# Patient Record
Sex: Male | Born: 1940
Health system: Southern US, Community
[De-identification: ages and names within clinical notes are randomized; demographics above are authoritative.]

## PROBLEM LIST (undated history)

## (undated) DIAGNOSIS — C801 Malignant (primary) neoplasm, unspecified: Secondary | ICD-10-CM

## (undated) DIAGNOSIS — D699 Hemorrhagic condition, unspecified: Secondary | ICD-10-CM

## (undated) DIAGNOSIS — M1990 Unspecified osteoarthritis, unspecified site: Secondary | ICD-10-CM

## (undated) DIAGNOSIS — E119 Type 2 diabetes mellitus without complications: Secondary | ICD-10-CM

## (undated) DIAGNOSIS — K219 Gastro-esophageal reflux disease without esophagitis: Secondary | ICD-10-CM

## (undated) DIAGNOSIS — I10 Essential (primary) hypertension: Secondary | ICD-10-CM

## (undated) HISTORY — PX: APPENDECTOMY: SHX54

## (undated) HISTORY — PX: REPLACEMENT TOTAL KNEE: SUR1224

## (undated) HISTORY — PX: CARPAL TUNNEL RELEASE: SHX101

## (undated) HISTORY — DX: Hemorrhagic condition, unspecified: D69.9

## (undated) HISTORY — DX: Gastro-esophageal reflux disease without esophagitis: K21.9

## (undated) HISTORY — DX: Type 2 diabetes mellitus without complications: E11.9

## (undated) HISTORY — DX: Unspecified osteoarthritis, unspecified site: M19.90

## (undated) HISTORY — PX: GALLBLADDER SURGERY: SHX652

---

## 2012-03-06 ENCOUNTER — Emergency Department: Payer: Self-pay | Admitting: Emergency Medicine

## 2012-03-06 LAB — COMPREHENSIVE METABOLIC PANEL
Albumin: 3.7 g/dL (ref 3.4–5.0)
Alkaline Phosphatase: 65 U/L (ref 50–136)
BUN: 14 mg/dL (ref 7–18)
Calcium, Total: 8.3 mg/dL — ABNORMAL LOW (ref 8.5–10.1)
Co2: 26 mmol/L (ref 21–32)
EGFR (Non-African Amer.): >60
Glucose: 146 mg/dL — ABNORMAL HIGH (ref 65–99)
SGOT(AST): 33 U/L (ref 15–37)
SGPT (ALT): 23 U/L

## 2012-03-06 LAB — PROTIME-INR
INR: 1.6
Prothrombin Time: 19.4 secs — ABNORMAL HIGH (ref 11.5–14.7)

## 2012-03-06 LAB — CBC
MCH: 31.2 pg (ref 26.0–34.0)
Platelet: 176 10*3/uL (ref 150–440)
RBC: 4.03 10*6/uL — ABNORMAL LOW (ref 4.40–5.90)
RDW: 15.3 % — ABNORMAL HIGH (ref 11.5–14.5)
WBC: 6.6 10*3/uL (ref 3.8–10.6)

## 2012-03-06 LAB — URINALYSIS, COMPLETE
Bacteria: NONE SEEN
Bilirubin,UR: NEGATIVE
Glucose,UR: NEGATIVE mg/dL (ref 0–75)
Hyaline Cast: 1
Leukocyte Esterase: NEGATIVE
RBC,UR: 1 /HPF (ref 0–5)
Squamous Epithelial: NONE SEEN
WBC UR: 1 /HPF (ref 0–5)

## 2012-03-06 LAB — APTT: Activated PTT: 42.3 secs — ABNORMAL HIGH (ref 23.6–35.9)

## 2012-07-28 ENCOUNTER — Ambulatory Visit: Payer: Self-pay | Admitting: General Practice

## 2012-07-28 DIAGNOSIS — I10 Essential (primary) hypertension: Secondary | ICD-10-CM

## 2012-07-28 LAB — BASIC METABOLIC PANEL
Anion Gap: 7 (ref 7–16)
Calcium, Total: 8.8 mg/dL (ref 8.5–10.1)
Co2: 28 mmol/L (ref 21–32)
Creatinine: 1.19 mg/dL (ref 0.60–1.30)
EGFR (African American): >60
EGFR (Non-African Amer.): >60
Glucose: 188 mg/dL — ABNORMAL HIGH (ref 65–99)
Potassium: 4.1 mmol/L (ref 3.5–5.1)
Sodium: 141 mmol/L (ref 136–145)

## 2012-07-28 LAB — CBC
HCT: 37.6 % — ABNORMAL LOW (ref 40.0–52.0)
MCH: 31.6 pg (ref 26.0–34.0)
MCV: 90 fL (ref 80–100)
Platelet: 226 10*3/uL (ref 150–440)
RDW: 14.7 % — ABNORMAL HIGH (ref 11.5–14.5)
WBC: 6.3 10*3/uL (ref 3.8–10.6)

## 2012-07-28 LAB — URINALYSIS, COMPLETE
Bilirubin,UR: NEGATIVE
Glucose,UR: NEGATIVE mg/dL (ref 0–75)
Ph: 5 (ref 4.5–8.0)
RBC,UR: 8 /HPF (ref 0–5)
Specific Gravity: 1.027 (ref 1.003–1.030)
Squamous Epithelial: NONE SEEN

## 2012-07-28 LAB — PROTIME-INR
INR: 1.1
Prothrombin Time: 14.3 secs (ref 11.5–14.7)

## 2012-07-29 LAB — URINE CULTURE

## 2012-08-11 ENCOUNTER — Ambulatory Visit: Payer: Self-pay | Admitting: Vascular Surgery

## 2012-08-11 LAB — PROTIME-INR
INR: 0.9
Prothrombin Time: 12.9 secs (ref 11.5–14.7)

## 2012-08-13 ENCOUNTER — Inpatient Hospital Stay: Payer: Self-pay | Admitting: General Practice

## 2012-08-13 LAB — PROTIME-INR
INR: 0.9
Prothrombin Time: 12.9 secs (ref 11.5–14.7)

## 2012-08-13 LAB — APTT: Activated PTT: 28.9 secs (ref 23.6–35.9)

## 2012-08-14 LAB — BASIC METABOLIC PANEL
Anion Gap: 6 — ABNORMAL LOW (ref 7–16)
Calcium, Total: 8.1 mg/dL — ABNORMAL LOW (ref 8.5–10.1)
Creatinine: 1.19 mg/dL (ref 0.60–1.30)
EGFR (African American): >60
EGFR (Non-African Amer.): >60
Glucose: 127 mg/dL — ABNORMAL HIGH (ref 65–99)
Osmolality: 278 (ref 275–301)
Sodium: 138 mmol/L (ref 136–145)

## 2012-08-14 LAB — PLATELET COUNT: Platelet: 148 10*3/uL — ABNORMAL LOW (ref 150–440)

## 2012-08-14 LAB — PROTIME-INR: INR: 1.1

## 2012-08-15 LAB — HEMOGLOBIN: HGB: 11 g/dL — ABNORMAL LOW (ref 13.0–18.0)

## 2012-08-15 LAB — PROTIME-INR: Prothrombin Time: 17.5 secs — ABNORMAL HIGH (ref 11.5–14.7)

## 2012-08-16 LAB — PROTIME-INR
INR: 1.9
Prothrombin Time: 22.4 secs — ABNORMAL HIGH (ref 11.5–14.7)

## 2012-11-06 ENCOUNTER — Ambulatory Visit: Payer: Self-pay | Admitting: Internal Medicine

## 2012-11-14 ENCOUNTER — Other Ambulatory Visit: Payer: Self-pay | Admitting: Internal Medicine

## 2012-11-14 LAB — PROTIME-INR: INR: 1.7

## 2012-12-23 ENCOUNTER — Ambulatory Visit: Payer: Self-pay | Admitting: General Practice

## 2012-12-23 LAB — CBC
HCT: 34.6 % — ABNORMAL LOW (ref 40.0–52.0)
HGB: 11.5 g/dL — ABNORMAL LOW (ref 13.0–18.0)
MCV: 83 fL (ref 80–100)
Platelet: 226 10*3/uL (ref 150–440)
RBC: 4.16 10*6/uL — ABNORMAL LOW (ref 4.40–5.90)

## 2012-12-23 LAB — BASIC METABOLIC PANEL
BUN: 11 mg/dL (ref 7–18)
Co2: 25 mmol/L (ref 21–32)
EGFR (African American): >60
Glucose: 156 mg/dL — ABNORMAL HIGH (ref 65–99)

## 2012-12-23 LAB — URINALYSIS, COMPLETE
Bacteria: NONE SEEN
Bilirubin,UR: NEGATIVE
Glucose,UR: NEGATIVE mg/dL (ref 0–75)
Ketone: NEGATIVE
Leukocyte Esterase: NEGATIVE
Nitrite: NEGATIVE
Ph: 5 (ref 4.5–8.0)
Protein: NEGATIVE
Specific Gravity: 1.01 (ref 1.003–1.030)
WBC UR: NONE SEEN /HPF (ref 0–5)

## 2012-12-23 LAB — APTT: Activated PTT: 50 secs — ABNORMAL HIGH (ref 23.6–35.9)

## 2012-12-23 LAB — SEDIMENTATION RATE: Erythrocyte Sed Rate: 10 mm/hr (ref 0–20)

## 2012-12-23 LAB — MRSA PCR SCREENING

## 2012-12-24 ENCOUNTER — Ambulatory Visit: Payer: Self-pay

## 2013-01-05 ENCOUNTER — Inpatient Hospital Stay: Payer: Self-pay | Admitting: General Practice

## 2013-01-05 LAB — APTT: Activated PTT: 33.7 secs (ref 23.6–35.9)

## 2013-01-05 LAB — PROTIME-INR: INR: 1

## 2013-01-06 LAB — BASIC METABOLIC PANEL
Anion Gap: 5 — ABNORMAL LOW (ref 7–16)
Calcium, Total: 7.8 mg/dL — ABNORMAL LOW (ref 8.5–10.1)
Chloride: 105 mmol/L (ref 98–107)
Creatinine: 1.05 mg/dL (ref 0.60–1.30)
EGFR (African American): >60
EGFR (Non-African Amer.): >60
Glucose: 120 mg/dL — ABNORMAL HIGH (ref 65–99)
Osmolality: 275 (ref 275–301)

## 2013-01-07 LAB — BASIC METABOLIC PANEL
Anion Gap: 7 (ref 7–16)
BUN: 11 mg/dL (ref 7–18)
Co2: 25 mmol/L (ref 21–32)
EGFR (African American): >60
EGFR (Non-African Amer.): >60
Glucose: 117 mg/dL — ABNORMAL HIGH (ref 65–99)
Osmolality: 274 (ref 275–301)
Potassium: 3.8 mmol/L (ref 3.5–5.1)
Sodium: 137 mmol/L (ref 136–145)

## 2013-01-07 LAB — PROTIME-INR
INR: 1.7
Prothrombin Time: 20 secs — ABNORMAL HIGH (ref 11.5–14.7)

## 2013-01-07 LAB — PLATELET COUNT: Platelet: 148 10*3/uL — ABNORMAL LOW (ref 150–440)

## 2013-03-05 ENCOUNTER — Ambulatory Visit: Payer: Self-pay | Admitting: Vascular Surgery

## 2014-04-16 DIAGNOSIS — Z7901 Long term (current) use of anticoagulants: Secondary | ICD-10-CM | POA: Insufficient documentation

## 2014-04-29 DIAGNOSIS — E669 Obesity, unspecified: Secondary | ICD-10-CM | POA: Insufficient documentation

## 2014-04-29 DIAGNOSIS — E118 Type 2 diabetes mellitus with unspecified complications: Secondary | ICD-10-CM | POA: Insufficient documentation

## 2014-04-29 DIAGNOSIS — E119 Type 2 diabetes mellitus without complications: Secondary | ICD-10-CM | POA: Insufficient documentation

## 2014-04-29 DIAGNOSIS — M1990 Unspecified osteoarthritis, unspecified site: Secondary | ICD-10-CM | POA: Insufficient documentation

## 2014-04-29 DIAGNOSIS — I10 Essential (primary) hypertension: Secondary | ICD-10-CM | POA: Insufficient documentation

## 2014-04-29 DIAGNOSIS — I2699 Other pulmonary embolism without acute cor pulmonale: Secondary | ICD-10-CM | POA: Insufficient documentation

## 2014-04-29 DIAGNOSIS — E785 Hyperlipidemia, unspecified: Secondary | ICD-10-CM | POA: Insufficient documentation

## 2014-05-14 ENCOUNTER — Ambulatory Visit: Payer: Self-pay | Admitting: Otolaryngology

## 2014-12-31 DIAGNOSIS — M47816 Spondylosis without myelopathy or radiculopathy, lumbar region: Secondary | ICD-10-CM | POA: Insufficient documentation

## 2014-12-31 DIAGNOSIS — M5136 Other intervertebral disc degeneration, lumbar region: Secondary | ICD-10-CM | POA: Insufficient documentation

## 2015-04-05 NOTE — Op Note (Signed)
PATIENT NAME:  Jimmy Greer, Jimmy Greer MR#:  [id number] DATE OF BIRTH:  1941/06/21  DATE OF PROCEDURE:  08/13/2012  PREOPERATIVE DIAGNOSIS: Degenerative arthrosis of the left knee.   POSTOPERATIVE DIAGNOSIS: Degenerative arthrosis of the left knee.   PROCEDURE PERFORMED: Left total knee arthroplasty using computer-assisted navigation.   SURGEON: Laurice Record. Holley Bouche., MD  ASSISTANT: Vance Peper, PA-C (required to maintain retraction throughout the procedure)   ANESTHESIA: Femoral nerve block and spinal.   ESTIMATED BLOOD LOSS: 200 mL.   FLUIDS REPLACED: 2000 mL crystalloid.   TOURNIQUET TIME: 120 minutes.   DRAINS: Two medium drains to reinfusion system.   SOFT TISSUE RELEASES: Anterior cruciate ligament, posterior cruciate ligament, deep and superficial medial collateral ligament, patellofemoral ligament.   IMPLANTS UTILIZED: DePuy PFC Sigma size 5 posterior stabilized femoral component (cemented), size 5 MBT tibial component (cemented), 41 mm three peg oval dome patella (cemented), and a 10 mm stabilized rotating platform polyethylene insert.   INDICATIONS FOR SURGERY: The patient is a 74 year old male who has been seen for complaints of progressive left knee pain. X-rays demonstrated severe degenerative changes in a tricompartmental fashion with relative varus alignment. After discussion of the risks and benefits of surgical intervention, the patient expressed his understanding of the risks and benefits and agreed with plans for surgical intervention.   PROCEDURE IN DETAIL: Patient was brought into the Operating Room and, after adequate femoral nerve block and spinal anesthesia was achieved, a tourniquet was placed on the patient's upper left thigh. Patient's left knee and leg were cleaned and prepped with alcohol and DuraPrep and draped in the usual sterile fashion. A "timeout" was performed as per usual protocol. The left lower extremity was exsanguinated using an Esmarch, and tourniquet was  inflated to 300 mmHg. Anterior longitudinal incision was made followed by a standard mid vastus approach. Deep fibers of the medial collateral ligament were elevated in subperiosteal fashion off the medial flare of the tibia so as to maintain a continuous soft tissue sleeve. Patella was subluxed laterally and patellofemoral ligament was incised. Inspection of the knee demonstrated severe degenerative changes in a tricompartmental fashion. Relative varus deformity was noted. The anterior and posterior cruciate ligaments were excised. Prominent osteophytes were debrided using rongeur. Two 4.0 mm Schanz pins were inserted into the femur and into the tibia for attachment of the array of trackers used for computer-assisted navigation. Hip center was identified using circumduction technique. Distal landmarks were mapped using computer. Distal femur and proximal tibia were mapped using computer. Distal femoral cutting guide was positioned using computer-assisted navigation so as to achieve a 5 degrees distal valgus cut. Cut was performed and verified using computer. Distal femur was sized and it was felt that a size 5 femoral component was appropriate. A size 5 cutting guide was positioned and the anterior cut was performed and verified using computer. This was followed by completion of the posterior and chamfer cuts. The femoral cutting guide for the central box was then positioned and the central box cut was performed.   Attention was then directed to the proximal tibia. Medial and lateral menisci were excised. The extramedullary tibial cutting guide was positioned using computer-assisted navigation so as to achieve 0 degrees varus valgus alignment and 0 degrees posterior slope. Cut was performed and verified using the computer. Proximal tibia was sized and it was felt that a size 5 tibial tray was appropriate. Tibial and femoral trials were inserted followed by insertion of a 10 mm polyethylene insert. Knee was felt  to be tight medially. A Cobb elevator was used to elevate the superficial fibers of the medial collateral ligament. This allowed for excellent mediolateral soft tissue balancing. Finally, patella was cut and prepared so as to accommodate a 41 mm three peg oval dome patella. Patellar trial was placed and the knee was placed through a range of motion with excellent patellar tracking appreciated. Femoral trial was removed. Posterior osteophytes were debrided as well as multiple osteochondral loose bodies along the posterior aspect. The central post hole for the tibial component was reamed followed by insertion of a keel punch. Tibial trials were removed. Cut surfaces of bone were irrigated with copious amounts of normal saline with antibiotic solution using pulsatile lavage and then suctioned dry. Polymethyl methacrylate cement with gentamicin was prepared in the usual fashion using a vacuum mixer. Cement was applied to the cut surface of the proximal tibia as well as along the undersurface of a size 5 MBT tibial component. Tibial component was positioned and impacted into place. Excess cement was removed using freer elevators. Cement was then applied to the cut surface of the femur as well as along the posterior flanges of a size 5 posterior stabilized femoral component. Femoral component was positioned and impacted into place. Excess cement was removed using freer elevators. A 10 mm stabilized polyethylene trial was inserted and the knee was brought into full extension with steady axial compression applied. Cement was then applied to the backside of a 41 mm three peg oval dome patella and patellar component was positioned and patellar clamp applied. Excess cement was removed using freer elevators.   After adequate curing of cement, tourniquet was deflated after total tourniquet time of 120 minutes. Hemostasis was achieved using electrocautery. The knee was inspected for any residual cement debris. 30 mL of 0.25%  Marcaine with epinephrine was injected along the posterior capsule. A 10 mm stabilized rotating platform polyethylene insert was inserted and the knee was placed through a range of motion with excellent patellar tracking appreciated. Two medium drains were placed in the wound bed and brought out through a separate stab incision for attachment of the reinfusion system. The medial parapatellar portion of the incision was reapproximated using interrupted sutures of #1 Vicryl. The subcutaneous tissue was approximated using interrupted sutures of #1 Vicryl, subcutaneous tissue was approximated using first #0 Vicryl followed by 2-0 Vicryl. Skin was closed with skin staples. A sterile dressing was applied.   Patient tolerated procedure well. He was transported to the recovery room in stable condition.    ____________________________ Laurice Record. Holley Bouche., MD jph:cms D: 08/13/2012 23:36:26 ET T: 08/14/2012 09:44:55 ET JOB#: [id number]  cc: Laurice Record. Holley Bouche., MD, <Dictator> JAMES P Holley Bouche MD ELECTRONICALLY SIGNED 08/18/2012 8:00

## 2015-04-05 NOTE — Op Note (Signed)
PATIENT NAME:  Jimmy Greer, SILVESTRO MR#:  [id number] DATE OF BIRTH:  December 18, 1940  DATE OF PROCEDURE:  08/11/2012  PREOPERATIVE DIAGNOSES:  1. Pulmonary embolus.  2. Upcoming knee surgery with need for cessation of anticoagulation and high risk for thromboembolic events.   POSTOPERATIVE DIAGNOSES:    1. Pulmonary embolus.  2. Upcoming knee surgery with need for cessation of anticoagulation and high risk for thromboembolic events.   PROCEDURES:   1. Ultrasound guidance for vascular access, right femoral vein.  2. Placement of catheter in the inferior vena cava.  3. Inferior venacavogram.  4. Placement of a Bard Meridian IVC filter.   SURGEON: Algernon Huxley, M.D.   ANESTHESIA: Local with Versed.   ESTIMATED BLOOD LOSS: Minimal.  FLUOROSCOPY TIME: One minute.  CONTRAST USED: 15 mL Visipaque.   INDICATION FOR PROCEDURE: This is a 74 year old gentleman who has upcoming knee surgery. He has a history of pulmonary embolus and is currently on Coumadin. This will be stopped for his knee replacement and he is at high risk for thromboembolic complications. For this reason, IVC filter will be placed. The risks and benefits were discussed. Informed consent was obtained.   DESCRIPTION OF PROCEDURE: The patient was brought to the vascular interventional radiology suite. Groins were shaved and prepped and a sterile surgical field was created. The right femoral vein was visualized with ultrasound and found to be widely patent. It was then accessed under direct ultrasound guidance without difficulty with a Seldinger needle and permanent image was recorded. A J-wire was placed. After skin nick and dilatation, the delivery sheath was placed over the wire. Imaging performed through the delivery sheath showed the patent vena cava that measured approximately 21-22 millimeters in diameter. I then deployed the IVC filter at L2 below the level of the renal veins and removed the delivery system. Pressure was held. Sterile  dressing was placed. The patient tolerated the procedure well and was taken to the recovery room in stable condition.   ____________________________ Algernon Huxley, MD jsd:ap D: 08/11/2012 11:28:33 ET T: 08/11/2012 12:22:50 ET JOB#: [id number]  cc: Algernon Huxley, MD, <Dictator> Laurice Record. Holley Bouche., MD Algernon Huxley MD ELECTRONICALLY SIGNED 08/20/2012 10:00

## 2015-04-05 NOTE — Discharge Summary (Signed)
PATIENT NAME:  RAGAN, REALE MR#:  [id number] DATE OF BIRTH:  01/24/1941  DATE OF ADMISSION:  08/13/2012 DATE OF DISCHARGE:  08/16/2012  ADMITTING DIAGNOSIS: Degenerative arthrosis of the left knee.   DISCHARGE DIAGNOSIS: Degenerative arthrosis of the left knee.   PROCEDURE PERFORMED: Left total knee arthroplasty using computer-assisted navigation.   SURGEON: Juanito Doom., M.D.   ASSISTANT: Marylee Floras, PA-C.   ANESTHESIA: Femoral nerve block and spinal.   ESTIMATED BLOOD LOSS: 200 mL.  FLUIDS REPLACED: 2000 mL of crystalloid.  TOURNIQUET TIME: 120 minutes.   DRAINS: Two medium drains to reinfusion system.   IMPLANTS UTILIZED: DePuy PFC Sigma size 5 posterior stabilized femoral component, size 5 MBT tibial component, 41 mm three peg oval dome patella, and a 10 mm stabilized rotating platform polyethylene insert.   HISTORY OF PRESENT ILLNESS: The patient is a 74 year old who has had a several year history of bilateral knee pain with the left knee more symptomatic than the right. He has noticed progressive decrease in his knee range of motion. Knee pain is aggravated by weight bearing activities as well as with attempts at arising from a seated position or stair ambulation. He localizes most of the pain on the medial aspect of the knee. He is not currently using any ambulatory aids. He reports some occasional swelling of the knee as well as evidence of near giving way and locking. He is having some night pain that interferes with his sleep. He has had difficulty exercising due to progressive knee pain. He is unable to take nonsteroidal anti-inflammatory medications due to anticoagulation with warfarin. The patient states the pain has progressed to the point that it is significantly interfering with his activities of daily living.   The patient recently moved to the [place] area from [place], [place]. He had been followed by [facility] in [place]. The  patient is now a patient of Dr. Georgie Chard. Also on this visit, the patient was questioned about his reason for being on Coumadin and it appears that in 2005 he had a spontaneous PE for which etiology was unknown. There was some question that he may have had a very small MI that may have precipitated this but never been confirmed. He has had EKG and stress test and everything since with all these being normal.   PHYSICAL EXAMINATION: GENERAL: Well developed, well-nourished male seen in no acute distress. Antalgic gait. Varus thrust to both knees. EXTREMITIES: Good strength, stability, and range of motion of the upper extremities, good range of motion of the hips and ankles. LEFT KNEE: Positive for medial joint line tenderness, crepitus, relative varus alignment and medial pseudolaxity. The patient's range of motion was 0 to 110 degrees. VASCULAR: Peripheral pulses are palpable. Good capillary refill. No gross pretibial or ankle edema. Homans test is negative. NEUROLOGIC: Awake, alert, and oriented. Sensory function is intact to pinprick and light touch. Motor strength is judged to be 5 out of 5. Motor coordination is within normal limits. No apparent clonus. No tremor.   HOSPITAL COURSE: The patient was admitted to the hospital on 08/13/2012. He had surgery that same day and was brought to the orthopedic floor from the PAC-U unit in stable condition. On postoperative day one, labs as well as vital signs were monitored and the patient remained stable. The patient progressed well with physical therapy with minimal pain. On 08/16/2012, the patient was ready for discharge home with home health and physical therapy. He had progressed very well  with physical therapy and labs and vital signs were stable.   DISCHARGE INSTRUCTIONS: The patient may gradually increase weight-bearing on the affected extremity. He needs to elevate the affected foot and leg on 1 or 2 pillows with the foot higher than the knee. Thigh-high  TED hose on both legs and remove at bedtime, replace when arising the next morning. The patient may resume a regular diet as tolerated. Resume your typical home medications.   Pain Medications: Tylenol 650 to 1000 mg every six hours as needed for pain, Ultram 1 to 2 tablets every six hours as needed for pain, and oxycodone 5 to 10 mg every four hours as needed for pain.   Wound care: Continue using the Polar Care unit maintaining temperature between 40 and 50 degrees. Do not get the dressing or bandage wet or dirty. Call [facility] if the dressing gets water under it. Leave the dressing on.   Symptoms to Report: Call [facility] [place] if any of the following occur: Bright red bleeding from the incision wound, fever above 101.5 degrees, redness, swelling, or drainage at the incision. Call [facility] if you experience any increased leg pain, numbness or weakness in the legs, bowel or bladder symptoms.   Referrals: Home physical therapy has been arranged for continuation of rehab program. Call [facility] orthopedics if a therapist has not contacted you within 48 hours OF return home. Follow-up appointment is 08/26/2012 at 10:15.   DISCHARGE MEDICATIONS:  1. Zolpidem 10 mg oral tablet 1 tablet orally once a day at bedtime as needed.  2. Omeprazole 20 mg oral delayed-release capsule one cap orally once a day in the morning.  3. Lisinopril 2.5 mg oral tablet 1 tablet orally once a day in the morning.  4. Ferrous fumarate 324 mg oral tablet 1 tablet orally twice a day as needed.  5. Warfarin 3 mg oral tablet 1 tablet orally once a day in the evening.  6. Coconut oil 1 tablespoon orally once a day. 7. Cissus extract oral capsule two caps orally once a day in the morning and one capsule at bedtime.  8. Warfarin 4 mg oral tablet 1 tablet orally on Monday, Tuesday, and Friday.  9. Simvastatin 40 mg oral tablet 1 tablet orally once a day at bedtime.   10. Amitriptyline 25 mg oral tablet 1 tablet orally once a day at bedtime.  11. Metformin 1000 mg oral tablet one orally twice a day.  12. Allopurinol 300 mg oral tablet 1 tablet orally once a day. 13. Niaspan ER 750 mg oral tablet extended release two tabs orally once a day at bedtime. 14. Pioglitazone 15 mg oral tablet 1 tablet orally once a day at bedtime.  15. Gabapentin 300 mg oral capsule two capsules orally three times daily. 16. Tamsulosin 0.4 mg oral capsule one capsule orally once a day  ____________________________ Duanne Guess, PA-C tcg:slb D: 08/26/2012 08:04:59 ET     T: 08/26/2012 15:48:06 ET       JOB#: [id number] cc: Duanne Guess, PA-C, <Dictator> Duanne Guess [place] ELECTRONICALLY SIGNED 09/16/2012 13:48

## 2015-04-08 NOTE — Discharge Summary (Signed)
PATIENT NAME:  Jimmy Greer, Jimmy Greer MR#:  [id number] DATE OF BIRTH:  [birth year]  DATE OF ADMISSION:  01/05/2013 DATE OF DISCHARGE:  01/07/2013  ADMITTING DIAGNOSIS: Degenerative arthrosis of the right knee.   DISCHARGE DIAGNOSIS: Degenerative arthrosis of the right knee.   HISTORY: The patient is a pleasant 74 year old white male who has been followed at [facility] for progression of bilateral knee pain. He had previously underwent a left total knee arthroplasty earlier and has done great. However, the right knee has continued to cause increased discomfort. He has had problems for years. He has had a history of Osgood-Schlatter in the past. He has currently been using a TENS unit as well as taking hydrocodone because of the severity of his discomfort. He had noticed a decrease in his range of motion. He was having no pain with weight-bearing activities. His pain had increased to the point that it was significantly interfering with his activities of daily living. X-rays taken in [facility] showed severe degenerative changes in a tricompartmental fashion with relative varus alignment noted. After discussion of the risks and benefits of surgical intervention, the patient expressed his understanding of the risks and benefits and agreed with plans for surgical intervention.   PROCEDURE: Right total knee arthroplasty using computer-assisted navigation.   ANESTHESIA: Femoral nerve block with spinal.   SOFT TISSUE RELEASE: Anterior cruciate ligament, posterior cruciate ligament, deep and superficial medial collateral ligaments, as well as patellofemoral ligament.   IMPLANTS UTILIZED: DePuy PFC Sigma size 5 cruciate substituting femoral component, size 4 MBT tibial component, a 38 mm three pegged oval dome patella (cemented), and a 10 mm rotating platform polyethylene insert.   HOSPITAL COURSE: The patient tolerated the procedure very well. He had no complications. He was then taken to PAC-U where he  was stabilized and then transferred to the orthopedic floor. The patient began receiving anticoagulation therapy of Coumadin 10 mg q. day. His INR was checked on a daily basis. Once his INR had become therapeutic, he was placed back on his regimen of alternating days of Coumadin 3 mg and every other day 4 mg. The patient was also fitted with the AV-I compression foot pumps bilaterally set at 80 mmHg. His calves have been nontender. There has been no evidence of any DVTs. The patient with TED stockings bilaterally. These were allowed to be removed one hour per eight hour shift. Heels were elevated off the bed using rolled towels.   The patient has denied any chest pain or shortness of breath. His vital signs have been stable. He has been afebrile. Hemodynamically he was stable and no transfusions were given other than the Autovac transfusions given the first 6 hours postoperatively.   Physical therapy was initiated on day one for gait training and transfers. He has done extremely well. Upon being discharged, he was ambulating greater than 200 feet. He was able go up and down four sets of steps. He was independent with bed to chair transfers. Occupational therapy was also initiated on day one for ADL and assistive devices. He has progressed very nicely.   The patient's IV, Foley and Hemovac were discontinued on day two along with the dressing change. The Polar Care was reapplied to the surgical leg maintaining a temperature of 40 to 50 degrees Fahrenheit.   DISPOSITION: The patient is being discharged to home in improved stable condition.   DISCHARGE INSTRUCTIONS: 1. The patient is to weight bear as tolerated.  2. Continue using a walker until cleared by physical  therapy to go to a quad cane.  3. He will receive home health PT. 4. Continue with TED stockings. These are to be worn during the day but may be removed at night.  5. Polar Care to the surgical leg maintaining a temperature of 40 to 50 degrees  Fahrenheit.  6. He is placed on an ADA diet.  7. He has a follow-up appointment in the Clinic in 2 weeks. He is to call the clinic sooner if any temperatures of 101.5 or greater or excessive bleeding.  8. A prescription for Roxicodone 5 to 10 mg every 4 to 6 hours p.r.n. was given as well as Tramadol 50 to 100 mg every 4 to 6 hours p.r.n. for pain. He is to resume his regular medications that he was on prior to admission.   PAST MEDICAL HISTORY: 1. Arthritis.  2. Diabetes. 3. Kidney stones.  4. Hypercholesterolemia.  5. History of pulmonary embolus.  6. Hypertension.  ____________________________ Vance Peper, PA jrw:sb D: 01/07/2013 10:20:04 ET T: 01/07/2013 10:46:53 ET JOB#: [id number]  cc: Vance Peper, PA, <Dictator> Veronica Guerrant PA ELECTRONICALLY SIGNED 01/07/2013 11:05

## 2015-04-08 NOTE — Op Note (Signed)
PATIENT NAME:  Jimmy Greer, Jimmy Greer MR#:  [id number] DATE OF BIRTH:  January 19, 1941  DATE OF PROCEDURE:  01/05/2013  PREOPERATIVE DIAGNOSIS: Degenerative arthrosis of the right knee.   POSTOPERATIVE DIAGNOSIS: Degenerative arthrosis of the left knee.   PROCEDURE PERFORMED: Right total knee arthroplasty using computer-assisted navigation.   SURGEON: Skip Estimable, M.D.   ASSISTANT: Vance Peper, PA-C (required to maintain retraction throughout the procedure).   ANESTHESIA: Femoral nerve block and spinal.   ESTIMATED BLOOD LOSS: 100 mL.   FLUIDS REPLACED: 2800 mL of crystalloid.   TOURNIQUET TIME: 125 minutes.   DRAINS: Two medium drains to reinfusion system.   SOFT TISSUE RELEASES: Anterior cruciate ligament, posterior cruciate ligament, deep and superficial medial collateral ligament, and patellofemoral ligament.   IMPLANTS UTILIZED:  DePuy PFC Sigma left-sided GMA, size 5 posterior stabilized femoral component (cemented), size 4 MBT tibial component (cemented), 38 mm 3-peg oval dome patella (cemented), and a 10 mm stabilized rotating platform polyethylene insert.   INDICATIONS FOR SURGERY: The patient is a 74 year old gentleman, who has been seen for complaints of progressive right knee pain. X-rays demonstrated severe degenerative changes in tricompartmental fashion with relative varus deformity. The patient previously underwent successful left total knee arthroplasty. After discussion of the risks and benefits of surgical intervention, the patient expressed understanding of the risks and benefits and agreed with plans for surgical intervention.   PROCEDURE IN DETAIL: The patient was brought to the operating room and, after adequate femoral nerve block and spinal anesthesia was achieved, a tourniquet was placed on the patient's upper right thigh. The patient's right knee and leg were cleaned and prepped with alcohol and DuraPrep and draped in the usual sterile fashion. A "timeout" was performed as  per usual protocol. The right lower extremity was exsanguinated using an Esmarch and the tourniquet was inflated to 300 mmHg. An anterior longitudinal incision was made followed by a standard mid vastus approach. A moderate effusion was evacuated. The deep fibers of the medial collateral ligament were elevated in a subperiosteal fashion off the medial flare of the tibia so as to maintain a continuous soft tissue sleeve. The patella was subluxed laterally and the patellofemoral ligament was incised. Prominent osteophytes were debrided from the patella. Of note, there was prominent tibial tubercle noted and care was taken to protect the attachment of the patellar tendon. Inspection of the knee demonstrated severe degenerative changes with full thickness loss of articular cartilage in areas, especially the medial compartment. Prominent osteophytes were debrided using a rongeur. Anterior and posterior cruciate ligaments were excised. Two 4.0 mm Schanz pins were inserted into the femur and into the tibia for attachment of the array of trackers used for computer-assisted navigation. Hip center was identified using circumduction technique. Distal landmarks were mapped using the computer. The distal femur and proximal tibia were mapped using the computer. Distal femoral cutting guide was positioned using computer-assisted navigation so as to achieve a 5 degree distal valgus cut. The cut was performed and verified using the computer.   Distal femur was then sized and it was felt that a size 5 femoral component was appropriate. A size 5 cutting guide was positioned and the anterior cut was performed and verified using the computer. This was followed by completion of the posterior and chamfer cuts. Femoral cutting guide for the central box was then positioned and the central box cut was performed. Attention was then directed to the proximal tibia. Medial and lateral menisci were excised. The extramedullary tibial cutting  guide was  positioned using computer-assisted navigation so as to achieve 0 degree varus valgus alignment and 0 degree posterior slope. Cut was performed and verified using the computer. The proximal tibia was sized and it was felt that a size 4 tibial tray was appropriate. Tibial and femoral trials were inserted followed by insertion of a 10 mm polyethylene insert. The knee was felt to be tight medially. A Cobb elevator was used to elevate the superficial fibers of the medial collateral ligament. This allowed for excellent mediolateral soft tissue balancing both in full extension and in flexion. Finally, the patella was cut and prepared so as to accommodate a 38 mm 3-peg oval dome patella. Patellar trial was placed and the knee was placed through a range of motion. Excellent patellar tracking appreciated. The femoral trial was removed after debridement of osteophytes off of the femoral condyles posteriorly. The central post hole for the tibial tray was reamed followed by insertion of a keel punch. Tibial trial was then removed. The cut surfaces of bone were irrigated with copious amounts of normal saline with antibiotic solution using pulsatile lavage and then suctioned dry. Polymethyl methacrylate cement with gentamicin was prepared in the usual fashion using a vacuum mixer. Cement was applied to the cut surface of the proximal tibia as well as along the undersurface of the size 4 MBT tibial component. The tibial component was positioned and impacted into place. Excess cement was removed using freer elevators. Cement was then applied to the cut surface of the femur as well as along the posterior flanges of a the size 5 posterior stabilized femoral component. Femoral component was positioned and impacted into place. Excess cement was removed using freer elevators. A 10 mm polyethylene trial was inserted and the knee was brought in full extension with steady axial compression applied.   Finally, cement was applied  to the backside of a 38 mm 3-peg oval dome patella and the patellar component was positioned and patellar clamp applied. Excess cement was removed using freer elevators. After adequate curing of cement, the tourniquet was deflated after total tourniquet time of 125 minutes. Hemostasis was achieved using electrocautery. The knee was irrigated with copious amounts of normal saline with antibiotic solution using pulsatile lavage and then suctioned dry. The knee was inspected for any residual cement debris. 30 mL of 0.25% Marcaine with epinephrine was injected along the posterior capsule. A 10 mm stabilized rotating platform polyethylene insert was inserted and the knee was placed through a range of motion with excellent patellar tracking appreciated and excellent mediolateral soft tissue balancing noted both in full extension and in flexion. Two medium drains were placed in the immobilizer through a separate stab incision to be attached to a reinfusion system. The medial parapatellar portion of the incision was reapproximated using interrupted sutures of #1 Vicryl. The subcutaneous tissue was approximated in layers using first #0 Vicryl followed by 2-0 Vicryl. Skin was closed with skin staples. A sterile dressing was applied. The patient tolerated the procedure well. He was transported to the recovery room in stable condition.   ____________________________ Laurice Record. Holley Bouche., MD [email or web address]:aw D: 01/05/2013 11:50:06 ET T: 01/05/2013 12:00:39 ET JOB#: [id number]  cc: Jeneen Rinks P. Holley Bouche., MD, <Dictator> JAMES P Holley Bouche MD ELECTRONICALLY SIGNED 01/06/2013 23:25

## 2015-04-08 NOTE — Op Note (Signed)
PATIENT NAME:  Jimmy Greer, Jimmy Greer MR#:  [id number] DATE OF BIRTH:  10-Sep-1941  DATE OF PROCEDURE:  03/05/2013  PREOPERATIVE DIAGNOSES: 1.  Status post inferior vena cava filter placement.  2.  History of deep venous thrombosis and pulmonary embolism.  3. Recent bilateral knee replacements.   POSTOPERATIVE DIAGNOSES: 1.  Status post inferior vena cava filter placement.  2.  History of deep venous thrombosis and pulmonary embolism.  3. Recent bilateral knee replacements.   PROCEDURES 1.  Ultrasound guidance for vascular access, right jugular vein.  2.  Catheter placement into inferior vena cava from right jugular approach.  3.  Inferior venacavogram.  4.  Retrieval of [place] inferior vena cava filter.   SURGEON: Algernon Huxley, M.D.   ANESTHESIA: Local, with moderate conscious sedation.   BLOOD LOSS: Minimal.   FLUOROSCOPY TIME: Approximately 2 minutes, and 15 mL of contrast was used.  INDICATION FOR PROCEDURE: A 74 year old white male with an IVC filter that was placed approximately 6 months ago. He has an extensive history of DVT and PE. He had bilateral knee replacements. He was recently found to have no acute DVT, and we discussed removal of the IVC filter to avoid possible long-term consequences of filters being in place. Risks and benefits were discussed. Informed consent was obtained.   DESCRIPTION OF PROCEDURE: The patient was brought to the vascular and interventional radiology suite. The right neck was sterilely prepped and draped and a sterile surgical field was created. The right jugular vein was visualized with ultrasound and found to be widely patent. It was then accessed with ultrasound guidance without difficulty with difficulty with a Seldinger needle and a permanent image was recorded. After skin nick and dilatation the delivery sheath was placed into the inferior vena cava over the J-wire and an inferior venacavogram was then performed. This showed a patent vena cava,  with the filter in adequate anatomical location.   I then snared the hook without difficulty with the snare, collapsed the filter by advancing the sheath, removed the filter in its entirety with gentle traction. The delivery sheath was removed. Pressure was held and a sterile dressing was placed. The patient tolerated the procedure well and was taken to the recovery room in stable condition.    ____________________________ Algernon Huxley, MD jsd:dm D: 03/05/2013 10:18:00 ET T: 03/05/2013 10:29:48 ET JOB#: [id number]  cc: Laurice Record. Holley Bouche., MD Algernon Huxley, MD, <Dictator>   Algernon Huxley MD ELECTRONICALLY SIGNED 03/11/2013 17:06

## 2015-06-08 DIAGNOSIS — M5416 Radiculopathy, lumbar region: Secondary | ICD-10-CM | POA: Insufficient documentation

## 2016-02-16 DIAGNOSIS — Z6841 Body Mass Index (BMI) 40.0 and over, adult: Secondary | ICD-10-CM

## 2016-05-18 ENCOUNTER — Ambulatory Visit
Admission: RE | Admit: 2016-05-18 | Discharge: 2016-05-18 | Disposition: A | Discharge status: Home / Self Care | Payer: Medicare Other | Source: Ambulatory Visit | Attending: Internal Medicine | Admitting: Internal Medicine

## 2016-05-18 ENCOUNTER — Other Ambulatory Visit: Payer: Self-pay | Admitting: Internal Medicine

## 2016-05-18 DIAGNOSIS — R0602 Shortness of breath: Secondary | ICD-10-CM | POA: Diagnosis present

## 2016-05-18 DIAGNOSIS — G4733 Obstructive sleep apnea (adult) (pediatric): Secondary | ICD-10-CM | POA: Insufficient documentation

## 2016-05-18 DIAGNOSIS — I700 Atherosclerosis of aorta: Secondary | ICD-10-CM | POA: Diagnosis not present

## 2016-05-18 DIAGNOSIS — I2510 Atherosclerotic heart disease of native coronary artery without angina pectoris: Secondary | ICD-10-CM | POA: Diagnosis not present

## 2016-05-18 HISTORY — DX: Essential (primary) hypertension: I10

## 2016-05-18 MED ORDER — IOPAMIDOL (ISOVUE-370) INJECTION 76%
100.0000 mL | Freq: Once | INTRAVENOUS | Status: AC | PRN
  Administered 2016-05-18: 100 mL via INTRAVENOUS

## 2016-07-04 ENCOUNTER — Ambulatory Visit: Payer: Medicare Other | Attending: Specialist

## 2016-07-04 DIAGNOSIS — G4733 Obstructive sleep apnea (adult) (pediatric): Secondary | ICD-10-CM | POA: Diagnosis not present

## 2016-07-04 DIAGNOSIS — G4730 Sleep apnea, unspecified: Secondary | ICD-10-CM | POA: Diagnosis present

## 2017-01-08 ENCOUNTER — Ambulatory Visit (INDEPENDENT_AMBULATORY_CARE_PROVIDER_SITE_OTHER): Payer: Medicare Other | Admitting: Urology

## 2017-01-08 ENCOUNTER — Encounter: Payer: Self-pay | Admitting: Urology

## 2017-01-08 VITALS — BP 104/75 | HR 79 | Ht 70.0 in | Wt 271.8 lb

## 2017-01-08 DIAGNOSIS — N401 Enlarged prostate with lower urinary tract symptoms: Secondary | ICD-10-CM

## 2017-01-08 DIAGNOSIS — N138 Other obstructive and reflux uropathy: Secondary | ICD-10-CM

## 2017-01-08 DIAGNOSIS — R972 Elevated prostate specific antigen [PSA]: Secondary | ICD-10-CM

## 2017-01-08 DIAGNOSIS — N529 Male erectile dysfunction, unspecified: Secondary | ICD-10-CM | POA: Diagnosis not present

## 2017-01-08 LAB — BLADDER SCAN AMB NON-IMAGING: Scan Result: 36

## 2017-01-08 NOTE — Progress Notes (Signed)
01/08/2017 10:22 AM   Jimmy Greer [birth year] [id number]  Referring provider: Idelle Crouch, MD [facility] [place], [place] [address]  Chief Complaint  Patient presents with  . Elevated PSA    HPI: Patient is a 76 year old Caucasian male who is referred by Dr. Doy Hutching for an elevated PSA.  Patient was found to have a PSA of 4.83 ng/mL on 12/24/2016 during a routine medical exam.  His previous PSA was 3.5 ng/mL on 12/23/2015.    His IPSS score today is 23, which is severe lower urinary tract symptomatology. He is unhappy with his quality life due to his urinary symptoms. His PVR is 36 mL.      His major complaints today are frequency, urgency, nocturia and incontinence.  He has had these symptoms for two years.  He denies any dysuria, hematuria or suprapubic pain.   He currently taking tamsulosin 0.4 mg bid.   He had been on finasteride in the past, but he developed gynecomastia.      He also denies any recent fevers, chills, nausea or vomiting.  He has a family history of PCa, with his brother being diagnosed with prostate cancer.        IPSS    Row Name 01/08/17 0900         International Prostate Symptom Score   How often have you had the sensation of not emptying your bladder? Almost always     How often have you had to urinate less than every two hours? More than half the time     How often have you found you stopped and started again several times when you urinated? More than half the time     How often have you found it difficult to postpone urination? About half the time     How often have you had a weak urinary stream? About half the time     How often have you had to strain to start urination? Less than 1 in 5 times     How many times did you typically get up at night to urinate? 3 Times     Total IPSS Score 23       Quality of Life due to urinary symptoms   If you were to spend the rest of your life with your urinary  condition just the way it is now how would you feel about that? Unhappy        Score:  1-7 Mild 8-19 Moderate 20-35 Severe  His SHIM score is 6, which is severe.   He has been having difficulty with erections for several years.  His major complaint is lack of firmness for penetration.  His libido is preserved.   His risk factors for ED are age, spinal injury, DM, HTN, HLD, sleep apnea and blood pressure medications.   He denies any painful erections or curvatures with his erections.   He has tried sildenafil in the past with mixed results.  He has a erect device that he would like some assistance with using.       SHIM    Row Name 01/08/17 0956         SHIM: Over the last 6 months:   How do you rate your confidence that you could get and keep an erection? Very Low     When you had erections with sexual stimulation, how often were your erections hard enough for penetration (entering your partner)? Almost  Never or Never     During sexual intercourse, how often were you able to maintain your erection after you had penetrated (entered) your partner? Extremely Difficult     During sexual intercourse, how difficult was it to maintain your erection to completion of intercourse? Extremely Difficult     When you attempted sexual intercourse, how often was it satisfactory for you? Very Difficult       SHIM Total Score   SHIM 6        Score: 1-7 Severe ED 8-11 Moderate ED 12-16 Mild-Moderate ED 17-21 Mild ED 22-25 No ED  PMH: Past Medical History:  Diagnosis Date  . Arthritis   . Bleeding disorder ([place])   . Diabetes mellitus without complication ([place])   . GERD (gastroesophageal reflux disease)   . Hypertension     Surgical History: Past Surgical History:  Procedure Laterality Date  . APPENDECTOMY    . CARPAL TUNNEL RELEASE    . GALLBLADDER SURGERY    . REPLACEMENT TOTAL KNEE Bilateral     Home Medications:  Allergies as of 01/08/2017   No Known Allergies     Medication  List       Accurate as of 01/08/17 10:22 AM. Always use your most recent med list.          allopurinol 300 MG tablet Commonly known as:  ZYLOPRIM TAKE 1 TABLET ONE TIME DAILY   aspirin EC 81 MG tablet Take by mouth.   gabapentin 600 MG tablet Commonly known as:  NEURONTIN TAKE 1 TABLET THREE TIMES DAILY   meloxicam 7.5 MG tablet Commonly known as:  MOBIC Take 7.5 mg by mouth daily.   metFORMIN 1000 MG tablet Commonly known as:  GLUCOPHAGE Take 1,000 mg by mouth 2 (two) times daily with a meal.   omeprazole 40 MG capsule Commonly known as:  PRILOSEC TAKE 1 CAPSULE TWICE DAILY   pioglitazone 15 MG tablet Commonly known as:  ACTOS TAKE 1 TABLET ONE TIME DAILY   simvastatin 40 MG tablet Commonly known as:  ZOCOR TAKE 1 TABLET EVERY NIGHT   tamsulosin 0.4 MG Caps capsule Commonly known as:  FLOMAX TAKE 1 CAPSULE TWICE DAILY   warfarin 5 MG tablet Commonly known as:  COUMADIN Take by mouth.   warfarin 3 MG tablet Commonly known as:  COUMADIN Take by mouth.       Allergies: No Known Allergies  Family History: Family History  Problem Relation Age of Onset  . Prostate cancer Brother   . Bladder Cancer Neg Hx   . Kidney cancer Neg Hx     Social History:  reports that he has quit smoking. He has never used smokeless tobacco. He reports that he drinks alcohol. He reports that he does not use drugs.  ROS: UROLOGY Frequent Urination?: Yes Hard to postpone urination?: Yes Burning/pain with urination?: No Get up at night to urinate?: Yes Leakage of urine?: Yes Urine stream starts and stops?: No Trouble starting stream?: No Do you have to strain to urinate?: No Blood in urine?: No Urinary tract infection?: No Sexually transmitted disease?: No Injury to kidneys or bladder?: No Painful intercourse?: No Weak stream?: No Erection problems?: No Penile pain?: No  Gastrointestinal Nausea?: No Vomiting?: No Indigestion/heartburn?: No Diarrhea?:  Yes Constipation?: No  Constitutional Fever: No Night sweats?: No Weight loss?: No Fatigue?: No  Skin Skin rash/lesions?: Yes Itching?: Yes  Eyes Blurred vision?: No Double vision?: No  Ears/Nose/Throat Sore throat?: No Sinus problems?: No  Hematologic/Lymphatic  Swollen glands?: No Easy bruising?: Yes  Cardiovascular Leg swelling?: Yes Chest pain?: No  Respiratory Cough?: No Shortness of breath?: Yes  Endocrine Excessive thirst?: No  Musculoskeletal Back pain?: Yes Joint pain?: Yes  Neurological Headaches?: No Dizziness?: No  Psychologic Depression?: No Anxiety?: No  Physical Exam: BP 104/75   Pulse 79   Ht 5\' 10"  (1.778 m)   Wt 271 lb 12.8 oz (123.3 kg)   BMI 39.00 kg/m   Constitutional: Well nourished. Alert and oriented, No acute distress. HEENT: [place] AT, moist mucus membranes. Trachea midline, no masses. Cardiovascular: No clubbing, cyanosis, or edema. Respiratory: Normal respiratory effort, no increased work of breathing. GI: Abdomen is soft, non tender, non distended, no abdominal masses. Liver and spleen not palpable.  No hernias appreciated.  Stool sample for occult testing is not indicated.   GU: No CVA tenderness.  No bladder fullness or masses.  Patient with circumcised phallus.   Urethral meatus is patent.  No penile discharge. No penile lesions or rashes. Scrotum without lesions, cysts, rashes and/or edema.  Testicles are located scrotally bilaterally. No masses are appreciated in the testicles. Left and right epididymis are normal. Rectal: Patient with  normal sphincter tone. Anus and perineum without scarring or rashes. No rectal masses are appreciated. Prostate is approximately 45 grams, right lobe > left lobe, no nodules are appreciated. Seminal vesicles are normal. Skin: No rashes, bruises or suspicious lesions. Lymph: No cervical or inguinal adenopathy. Neurologic: Grossly intact, no focal deficits, moving all 4  extremities. Psychiatric: Normal mood and affect.  Laboratory Data: Lab Results  Component Value Date   WBC 3.9 12/23/2012   HGB 9.0 (L) 01/07/2013   HCT 34.6 (L) 12/23/2012   MCV 83 12/23/2012   PLT 148 (L) 01/07/2013    Lab Results  Component Value Date   CREATININE 0.90 01/07/2013    PSA History  2.42 ng/mL on 10/25/2014  3.50 ng/mL on 12/23/2015  4.83 ng/mL on 12/24/2016  Lab Results  Component Value Date   AST 33 03/06/2012   Lab Results  Component Value Date   ALT 23 03/06/2012    Pertinent Imaging: Results for YIMING, LEBLANC (MRN [id number]) as of 01/08/2017 10:18  Ref. Range 01/08/2017 10:02  Scan Result Unknown 36    Assessment & Plan:    1. Elevated PSA  - I discussed with the patient that PSA is an acronym for  prostate specific antigen,  which is a protein made by the prostate gland and can be detected in the blood stream. I explained to the patient situations that would increase the PSA, such as: a man's age,  BPH, infection, recent intercourse/ejaculation, prostate infarction, recent urethroscopic manipulation (Foley placement/cystoscopy) and prostate cancer.              - We discussed that indications for prostate biopsy are defined by age and race specific PSA cutoffs as well as a PSA velocity of 0.75/year; which his current value of 4.83 is just outside of this range             - I discussed the AUA Guideline's (2013) for men aged 95+ years or any man with less than a 10 to 15 year life expectancy that screening is not recommended.  If the individual is in excellent health and after discussion it is decided to do a screening PSA, the threshold for biopsy should be raised to 10 ng/mL and if the PSA returns below 3 ng/mL, discontinue screening.             -  I did explain that at his age and with his co-morbid conditions, if prostate cancer is found his treatment would be palliative and not curative             - His questions where answered and he voiced  his understanding             - PSA is repeated today for conformation  2. BPH with LUTS  - IPSS score is 23/5  - Continue conservative management, avoiding bladder irritants and timed voiding's  - Continue tamsulosin 0.4 mg bid   - Add Myrbetriq 25 mg daily, # 28 samples given  - RTC in 3 weeks for I PSS and PVR  3. Erectile dysfunction  - SHIM score is 6   - I explained to the patient that in order to achieve an erection it takes good functioning of the nervous system (parasympathetic, sympathetic, sensory and motor), good blood flow into the erectile tissue of the penis and a desire to have sex  - I explained that conditions like diabetes, hypertension, coronary artery disease, peripheral vascular disease, smoking, alcohol consumption, age, sleep apnea and BPH can diminish the ability to have an erection  - Continue sildenafil 20 mg, 5 tablets at once two hours prior to intercourse on an empty stomach  - RTC in 3 weeks for repeat SHIM score and patient will bring his erect aid device  Return in about 3 weeks (around 01/29/2017) for IPSS and PVR.  These notes generated with voice recognition software. I apologize for typographical errors.  Zara Council, [facility] [address], [place], [place] [address] [phone]

## 2017-01-09 ENCOUNTER — Telehealth: Payer: Self-pay

## 2017-01-09 LAB — PSA: Prostate Specific Ag, Serum: 4.8 ng/mL — ABNORMAL HIGH (ref 0.0–4.0)

## 2017-01-09 NOTE — Telephone Encounter (Signed)
-----   Message from Nori Riis, PA-C sent at 01/09/2017  7:48 AM EST ----- Would you please add a free and total PSA to his blood work and let him know?

## 2017-01-09 NOTE — Telephone Encounter (Signed)
Labs were added.

## 2017-01-10 ENCOUNTER — Telehealth: Payer: Self-pay

## 2017-01-10 NOTE — Telephone Encounter (Signed)
-----   Message from Nori Riis, PA-C sent at 01/10/2017  8:05 AM EST ----- Please let the patient know that his probability of having prostate cancer is 23%.  I suggest repeating the PSA in 6 months.

## 2017-01-10 NOTE — Telephone Encounter (Signed)
Spoke with pt in reference to PSA results and needing labs in 61mo. Pt voiced understanding.

## 2017-01-10 NOTE — Telephone Encounter (Signed)
LMOM

## 2017-01-28 NOTE — Progress Notes (Signed)
01/29/2017 10:26 AM   Jimmy Greer [birth year] [id number]  Referring provider: Idelle Crouch, MD Jimmy Jimmy Greer [place], Jimmy Greer [id number]  Chief Complaint  Patient presents with  . Benign Prostatic Hypertrophy    3 week follow up     HPI: Patient is a 76 year old Caucasian male with BPH with LU TS manifesting with irritative symptoms and elevated PSA who presents today for a 3 week follow up after a trial of Myrbetriq, he is with his wife Jimmy Greer.    Patient was referred by Dr. Doy Greer for an elevated PSA. Patient was found to have a PSA of 4.83 ng/mL on 12/24/2016 during a routine medical exam.  His previous PSA was 3.5 ng/mL on 12/23/2015.  His repeated PSA was found to be 4.8 with a PSA, free of 0.86 resulting in a 23% probability of having prostate cancer.  He will be RTC in 6 months for repeated PSA and DRE.    His IPSS score today is 9, which is moderate lower urinary tract symptomatology. He is unhappy with his quality life due to his erectile dysfunction.  His PVR is 0 mL.   His previous I PSS score was 23/5.  His previous PVR was 36 mL.    His major complaints today are frequency, urgency, nocturia and incontinence.  He has had these symptoms for two years.  He denies any dysuria, hematuria or suprapubic pain.   He currently taking tamsulosin 0.4 mg bid.   He had been on finasteride in the past, but he developed gynecomastia.  He was started on Myrbetriq 25 mg daily three weeks ago.      He also denies any recent fevers, chills, nausea or vomiting.  He has a family history of PCa, with his brother being diagnosed with prostate cancer.        IPSS    Row Name 01/08/17 0900 01/29/17 1000       International Prostate Symptom Score   How often have you had the sensation of not emptying your bladder? Almost always Less than 1 in 5    How often have you had to urinate less than every two hours? More than half the time Less than 1 in 5 times      How often have you found you stopped and started again several times when you urinated? More than half the time Less than 1 in 5 times    How often have you found it difficult to postpone urination? About half the time Less than 1 in 5 times    How often have you had a weak urinary stream? About half the time Less than half the time    How often have you had to strain to start urination? Less than 1 in 5 times Not at All    How many times did you typically get up at night to urinate? 3 Times 3 Times    Total IPSS Score 23 9      Quality of Life due to urinary symptoms   If you were to spend the rest of your life with your urinary condition just the way it is now how would you feel about that? Unhappy Unhappy       Score:  1-7 Mild 8-19 Moderate 20-35 Severe  His SHIM score is 6, which is severe.   He has been having difficulty with erections for several years.  His major complaint is lack of firmness for  penetration.  His libido is preserved.   His risk factors for ED are age, spinal injury, DM, HTN, HLD, sleep apnea and blood pressure medications.   He denies any painful erections or curvatures with his erections.   He has tried sildenafil in the past with mixed results.  He has a erect device that he would like some assistance with using.  He did not bring his device to this appointment.       SHIM    Row Name 01/08/17 0956         SHIM: Over the last 6 months:   How do you rate your confidence that you could get and keep an erection? Very Low     When you had erections with sexual stimulation, how often were your erections hard enough for penetration (entering your partner)? Almost Never or Never     During sexual intercourse, how often were you able to maintain your erection after you had penetrated (entered) your partner? Extremely Difficult     During sexual intercourse, how difficult was it to maintain your erection to completion of intercourse? Extremely Difficult     When  you attempted sexual intercourse, how often was it satisfactory for you? Very Difficult       SHIM Total Score   SHIM 6        Score: 1-7 Severe ED 8-11 Moderate ED 12-16 Mild-Moderate ED 17-21 Mild ED 22-25 No ED    PMH: Past Medical History:  Diagnosis Date  . Arthritis   . Bleeding disorder ([place])   . Diabetes mellitus without complication ([place])   . GERD (gastroesophageal reflux disease)   . Hypertension     Surgical History: Past Surgical History:  Procedure Laterality Date  . APPENDECTOMY    . CARPAL TUNNEL RELEASE    . GALLBLADDER SURGERY    . REPLACEMENT TOTAL KNEE Bilateral     Home Medications:  Allergies as of 01/29/2017   No Known Allergies     Medication List       Accurate as of 01/29/17 10:26 AM. Always use your most recent med list.          allopurinol 300 MG tablet Commonly known as:  ZYLOPRIM TAKE 1 TABLET ONE TIME DAILY   aspirin EC 81 MG tablet Take by mouth.   gabapentin 600 MG tablet Commonly known as:  NEURONTIN TAKE 1 TABLET THREE TIMES DAILY   hydrochlorothiazide 12.5 MG capsule Commonly known as:  MICROZIDE Take 12.5 mg by mouth daily.   meloxicam 7.5 MG tablet Commonly known as:  MOBIC Take 7.5 mg by mouth daily.   metFORMIN 1000 MG tablet Commonly known as:  GLUCOPHAGE Take 1,000 mg by mouth 2 (two) times daily with a meal.   mirabegron ER 25 MG Tb24 tablet Commonly known as:  MYRBETRIQ Take 1 tablet (25 mg total) by mouth daily.   omeprazole 40 MG capsule Commonly known as:  PRILOSEC TAKE 1 CAPSULE TWICE DAILY   pioglitazone 15 MG tablet Commonly known as:  ACTOS TAKE 1 TABLET ONE TIME DAILY   simvastatin 40 MG tablet Commonly known as:  ZOCOR TAKE 1 TABLET EVERY NIGHT   tamsulosin 0.4 MG Caps capsule Commonly known as:  FLOMAX TAKE 1 CAPSULE TWICE DAILY   warfarin 5 MG tablet Commonly known as:  COUMADIN Take by mouth. 2 days per week   warfarin 3 MG tablet Commonly known as:  COUMADIN Take by  mouth. Five days per week  Allergies: No Known Allergies  Family History: Family History  Problem Relation Age of Onset  . Prostate cancer Brother   . Bladder Cancer Neg Hx   . Kidney cancer Neg Hx     Social History:  reports that he has quit smoking. He has never used smokeless tobacco. He reports that he drinks alcohol. He reports that he does not use drugs.  ROS: UROLOGY Frequent Urination?: Yes Hard to postpone urination?: Yes Burning/pain with urination?: No Get up at night to urinate?: Yes Leakage of urine?: Yes Urine stream starts and stops?: Yes Trouble starting stream?: No Do you have to strain to urinate?: No Blood in urine?: No Urinary tract infection?: No Sexually transmitted disease?: No Injury to kidneys or bladder?: No Painful intercourse?: No Weak stream?: No Erection problems?: Yes Penile pain?: No  Gastrointestinal Nausea?: No Vomiting?: No Indigestion/heartburn?: No Diarrhea?: Yes Constipation?: No  Constitutional Fever: No Night sweats?: No Weight loss?: No Fatigue?: Yes  Skin Skin rash/lesions?: No Itching?: No  Eyes Blurred vision?: No Double vision?: No  Ears/Nose/Throat Sore throat?: No Sinus problems?: No  Hematologic/Lymphatic Swollen glands?: No Easy bruising?: Yes  Cardiovascular Leg swelling?: Yes Chest pain?: No  Respiratory Cough?: No Shortness of breath?: Yes  Endocrine Excessive thirst?: No  Musculoskeletal Back pain?: Yes Joint pain?: Yes  Neurological Headaches?: No Dizziness?: No  Psychologic Depression?: No Anxiety?: No  Physical Exam: BP 126/75   Pulse 84   Ht 5\' 9"  (1.753 m)   Wt 272 lb (123.4 kg)   BMI 40.17 kg/m   Constitutional: Well nourished. Alert and oriented, No acute distress. HEENT: [place] AT, moist mucus membranes. Trachea midline, no masses. Cardiovascular: No clubbing, cyanosis, or edema. Respiratory: Normal respiratory effort, no increased work of  breathing. Skin: No rashes, bruises or suspicious lesions. Lymph: No cervical or inguinal adenopathy. Neurologic: Grossly intact, no focal deficits, moving all 4 extremities. Psychiatric: Normal mood and affect.  Laboratory Data: Lab Results  Component Value Date   WBC 3.9 12/23/2012   HGB 9.0 (L) 01/07/2013   HCT 34.6 (L) 12/23/2012   MCV 83 12/23/2012   PLT 148 (L) 01/07/2013    Lab Results  Component Value Date   CREATININE 0.90 01/07/2013    PSA History  2.42 ng/mL on 10/25/2014  3.50 ng/mL on 12/23/2015  4.83 ng/mL on 12/24/2016  4.80 ng/mL on 01/08/2017 -free PSA 0.86  Lab Results  Component Value Date   AST 33 03/06/2012   Lab Results  Component Value Date   ALT 23 03/06/2012    Pertinent Imaging: Results for DEXTIN, MARCUS (MRN [id number]) as of 01/29/2017 10:14  Ref. Range 01/29/2017 10:08  Scan Result Unknown 0    Assessment & Plan:    1. Elevated PSA  - RTC in 6 months for repeated PSA  2. BPH with LUTS  - IPSS score is 9/5, it is improving  - Continue conservative management, avoiding bladder irritants and timed voiding's  - Continue tamsulosin 0.4 mg bid   - Continue the Myrbetriq 25 mg daily  - RTC in 6 monthsfor I PSS and PVR  3. Erectile dysfunction  - SHIM score is 6   - I explained to the patient that in order to achieve an erection it takes good functioning of the nervous system (parasympathetic, sympathetic, sensory and motor), good blood flow into the erectile tissue of the penis and a desire to have sex  - I explained that conditions like diabetes, hypertension, coronary artery disease, peripheral vascular  disease, smoking, alcohol consumption, age, sleep apnea and BPH can diminish the ability to have an erection  - Continue sildenafil 20 mg, 5 tablets at once two hours prior to intercourse on an empty stomach  - RTC in 6 months for repeat SHIM score and patient will bring his erect aid device  Return in about 6 months (around  07/29/2017) for IPSS,SHIM, PSA and exam.  These notes generated with voice recognition software. I apologize for typographical errors.  Zara Council, [facility] [address] [place], [place] [address] [phone]

## 2017-01-29 ENCOUNTER — Ambulatory Visit (INDEPENDENT_AMBULATORY_CARE_PROVIDER_SITE_OTHER): Payer: Medicare Other | Admitting: Urology

## 2017-01-29 ENCOUNTER — Encounter: Payer: Self-pay | Admitting: Urology

## 2017-01-29 VITALS — BP 126/75 | HR 84 | Ht 69.0 in | Wt 272.0 lb

## 2017-01-29 DIAGNOSIS — N529 Male erectile dysfunction, unspecified: Secondary | ICD-10-CM

## 2017-01-29 DIAGNOSIS — N138 Other obstructive and reflux uropathy: Secondary | ICD-10-CM

## 2017-01-29 DIAGNOSIS — N400 Enlarged prostate without lower urinary tract symptoms: Secondary | ICD-10-CM

## 2017-01-29 DIAGNOSIS — R972 Elevated prostate specific antigen [PSA]: Secondary | ICD-10-CM

## 2017-01-29 DIAGNOSIS — N401 Enlarged prostate with lower urinary tract symptoms: Secondary | ICD-10-CM | POA: Diagnosis not present

## 2017-01-29 LAB — BLADDER SCAN AMB NON-IMAGING: Scan Result: 0

## 2017-01-29 MED ORDER — MIRABEGRON ER 25 MG PO TB24: 25.0000 mg | ORAL_TABLET | Freq: Every day | ORAL | 12 refills | Status: DC

## 2017-01-31 LAB — PSA, TOTAL AND FREE
PROSTATE SPECIFIC AG, SERUM: 4.8 ng/mL — AB (ref 0.0–4.0)
PSA FREE PCT: 17.9 %
PSA, Free: 0.86 ng/mL

## 2017-01-31 LAB — SPECIMEN STATUS REPORT

## 2017-05-17 ENCOUNTER — Encounter (INDEPENDENT_AMBULATORY_CARE_PROVIDER_SITE_OTHER): Payer: Self-pay | Admitting: Vascular Surgery

## 2017-05-17 ENCOUNTER — Ambulatory Visit (INDEPENDENT_AMBULATORY_CARE_PROVIDER_SITE_OTHER): Payer: Medicare Other | Admitting: Vascular Surgery

## 2017-05-17 ENCOUNTER — Other Ambulatory Visit (INDEPENDENT_AMBULATORY_CARE_PROVIDER_SITE_OTHER): Payer: Medicare Other

## 2017-05-17 ENCOUNTER — Other Ambulatory Visit (INDEPENDENT_AMBULATORY_CARE_PROVIDER_SITE_OTHER): Payer: Self-pay | Admitting: Vascular Surgery

## 2017-05-17 VITALS — BP 133/78 | HR 67 | Resp 16 | Wt 265.0 lb

## 2017-05-17 DIAGNOSIS — I10 Essential (primary) hypertension: Secondary | ICD-10-CM | POA: Diagnosis not present

## 2017-05-17 DIAGNOSIS — I2699 Other pulmonary embolism without acute cor pulmonale: Secondary | ICD-10-CM | POA: Diagnosis not present

## 2017-05-17 DIAGNOSIS — I70209 Unspecified atherosclerosis of native arteries of extremities, unspecified extremity: Secondary | ICD-10-CM

## 2017-05-17 DIAGNOSIS — I739 Peripheral vascular disease, unspecified: Secondary | ICD-10-CM

## 2017-05-17 NOTE — Assessment & Plan Note (Signed)
blood pressure control important in reducing the progression of atherosclerotic disease. On appropriate oral medications.  

## 2017-05-17 NOTE — Progress Notes (Signed)
MRN : [id number]  Jimmy Greer is a 76 y.o. ([birth year]) male who presents with chief complaint of  Chief Complaint  Patient presents with  . Follow-up  .  History of Present Illness: Patient returns today in follow up of PAD. He has a previous history of DVT and pulmonary embolus but has only mild residual left lower extremity swelling and postphlebitic symptoms. He does not currently have lifestyle limiting claudication, ischemic rest pain, or tissue loss. His ABIs today show evidence of medial calcification with some elevated pressures that were noncompressible and the posterior tibial arteries bilaterally, but his waveforms are triphasic in good and his posterior tibial ABIs are normal bilaterally. His digit pressures are also pretty good with good wave forms.  Current Outpatient Prescriptions  Medication Sig Dispense Refill  . allopurinol (ZYLOPRIM) 300 MG tablet TAKE 1 TABLET ONE TIME DAILY    . aspirin EC 81 MG tablet Take by mouth.    . cyanocobalamin ([employer] VITAMIN B12) 1000 MCG tablet Take by mouth.    . gabapentin (NEURONTIN) 600 MG tablet TAKE 1 TABLET THREE TIMES DAILY    . hydrochlorothiazide (MICROZIDE) 12.5 MG capsule Take 12.5 mg by mouth daily.    Marland Kitchen HYDROcodone-acetaminophen (NORCO/VICODIN) 5-325 MG tablet 1 po q6h prn    . Melatonin 5 MG CAPS Take by mouth.    . meloxicam (MOBIC) 7.5 MG tablet Take 7.5 mg by mouth daily.    . metFORMIN (GLUCOPHAGE) 1000 MG tablet Take 1,000 mg by mouth 2 (two) times daily with a meal.    . Multiple Vitamin (MULTI-VITAMINS) TABS Take by mouth.    Marland Kitchen omeprazole (PRILOSEC) 40 MG capsule TAKE 1 CAPSULE TWICE DAILY    . pioglitazone (ACTOS) 15 MG tablet TAKE 1 TABLET ONE TIME DAILY    . sildenafil (REVATIO) 20 MG tablet Take 20 mg by mouth as needed.    . simvastatin (ZOCOR) 40 MG tablet TAKE 1 TABLET EVERY NIGHT    . tamsulosin (FLOMAX) 0.4 MG CAPS capsule TAKE 1 CAPSULE TWICE DAILY    . traMADol (ULTRAM) 50 MG tablet Take by mouth  every 6 (six) hours as needed.    . warfarin (COUMADIN) 3 MG tablet Take by mouth. Five days per week    . warfarin (COUMADIN) 5 MG tablet Take by mouth. 2 days per week    . mirabegron ER (MYRBETRIQ) 25 MG TB24 tablet Take 1 tablet (25 mg total) by mouth daily. (Patient not taking: Reported on 05/17/2017) 30 tablet 12   No current facility-administered medications for this visit.     Past Medical History:  Diagnosis Date  . Arthritis   . Bleeding disorder ([place])   . Diabetes mellitus without complication ([place])   . GERD (gastroesophageal reflux disease)   . Hypertension     Past Surgical History:  Procedure Laterality Date  . APPENDECTOMY    . CARPAL TUNNEL RELEASE    . GALLBLADDER SURGERY    . REPLACEMENT TOTAL KNEE Bilateral     Social History Social History  Substance Use Topics  . Smoking status: Former Research scientist (life sciences)  . Smokeless tobacco: Never Used     Comment: quit 35 years  . Alcohol use Yes     Family History Family History  Problem Relation Age of Onset  . Prostate cancer Brother   . Bladder Cancer Neg Hx   . Kidney cancer Neg Hx      No Known Allergies   REVIEW OF SYSTEMS (Negative unless checked)  Constitutional: [] Weight loss  [] Fever  [] Chills Cardiac: [] Chest pain   [] Chest pressure   [] Palpitations   [] Shortness of breath when laying flat   [] Shortness of breath at rest   [] Shortness of breath with exertion. Vascular:  [] Pain in legs with walking   [] Pain in legs at rest   [] Pain in legs when laying flat   [] Claudication   [] Pain in feet when walking  [] Pain in feet at rest  [] Pain in feet when laying flat   [x] History of DVT   [] Phlebitis   [x] Swelling in legs   [] Varicose veins   [] Non-healing ulcers Pulmonary:   [] Uses home oxygen   [] Productive cough   [] Hemoptysis   [] Wheeze  [] COPD   [] Asthma Neurologic:  [] Dizziness  [] Blackouts   [] Seizures   [] History of stroke   [] History of TIA  [] Aphasia   [] Temporary blindness   [] Dysphagia   [] Weakness or  numbness in arms   [] Weakness or numbness in legs Musculoskeletal:  [] Arthritis   [] Joint swelling   [] Joint pain   [] Low back pain Hematologic:  [] Easy bruising  [] Easy bleeding   [] Hypercoagulable state   [] Anemic   Gastrointestinal:  [] Blood in stool   [] Vomiting blood  [] Gastroesophageal reflux/heartburn   [] Abdominal pain Genitourinary:  [] Chronic kidney disease   [] Difficult urination  [] Frequent urination  [] Burning with urination   [] Hematuria Skin:  [] Rashes   [] Ulcers   [] Wounds Psychological:  [] History of anxiety   []  History of major depression.  Physical Examination  BP 133/78   Pulse 67   Resp 16   Wt 120.2 kg (265 lb)   BMI 39.13 kg/m  Gen:  WD/WN, NAD Head: [place]/AT, No temporalis wasting. Ear/Nose/Throat: Hearing grossly intact, nares w/o erythema or drainage, trachea midline Eyes: Conjunctiva clear. Sclera non-icteric Neck: Supple.  No JVD.  Pulmonary:  Good air movement, no use of accessory muscles.  Cardiac: RRR, normal S1, S2 Vascular:  Vessel Right Left  Radial Palpable Palpable  Ulnar Palpable Palpable  Brachial Palpable Palpable  Carotid Palpable, without bruit Palpable, without bruit  Aorta Not palpable N/A  Femoral Palpable Palpable  Popliteal Palpable Palpable  PT Palpable Palpable  DP Palpable Palpable   Gastrointestinal: soft, non-tender/non-distended.  Musculoskeletal: M/S 5/5 throughout.  No deformity or atrophy. Mild LLE edema. Neurologic: Sensation grossly intact in extremities.  Symmetrical.  Speech is fluent.  Psychiatric: Judgment intact, Mood & affect appropriate for pt's clinical situation. Dermatologic: No rashes or ulcers noted.  No cellulitis or open wounds.       Labs No results found for this or any previous visit (from the past 2160 hour(s)).  Radiology No results found.   Assessment/Plan  HTN (hypertension) blood pressure control important in reducing the progression of atherosclerotic disease. On appropriate oral  medications.   Pulmonary embolism ([place]) Remote. No recent symptoms  PAD (peripheral artery disease) ([facility]) His ABIs today show evidence of medial calcification with some elevated pressures that were noncompressible and the posterior tibial arteries bilaterally, but his waveforms are triphasic in good and his posterior tibial ABIs are normal bilaterally. His digit pressures are also pretty good with good wave forms. At this point he has evidence of peripheral arterial disease without arterial insufficiency. Continue aspirin daily. continue statin agent. Continue heart healthy diet and exercise. Recheck in 2 years.    Leotis Pain, MD  05/17/2017 10:50 AM    This note was created with Dragon medical transcription system.  Any errors from dictation are purely unintentional

## 2017-05-17 NOTE — Assessment & Plan Note (Signed)
His ABIs today show evidence of medial calcification with some elevated pressures that were noncompressible and the posterior tibial arteries bilaterally, but his waveforms are triphasic in good and his posterior tibial ABIs are normal bilaterally. His digit pressures are also pretty good with good wave forms. At this point he has evidence of peripheral arterial disease without arterial insufficiency. Continue aspirin daily. continue statin agent. Continue heart healthy diet and exercise. Recheck in 2 years.

## 2017-05-17 NOTE — Assessment & Plan Note (Signed)
Remote. No recent symptoms. 

## 2017-07-25 ENCOUNTER — Other Ambulatory Visit: Payer: Self-pay

## 2017-07-25 DIAGNOSIS — R972 Elevated prostate specific antigen [PSA]: Secondary | ICD-10-CM

## 2017-07-29 ENCOUNTER — Other Ambulatory Visit: Payer: Medicare Other

## 2017-07-29 DIAGNOSIS — R972 Elevated prostate specific antigen [PSA]: Secondary | ICD-10-CM

## 2017-07-30 LAB — PSA: PROSTATE SPECIFIC AG, SERUM: 6.4 ng/mL — AB (ref 0.0–4.0)

## 2017-07-31 NOTE — Progress Notes (Signed)
08/01/2017 11:04 AM   Jimmy Greer 1941/08/17 [id number]  Referring provider: Idelle Crouch, MD [facility], [place] [address]  Chief Complaint  Patient presents with  . Benign Prostatic Hypertrophy    6 months follow up  . Elevated PSA    HPI: Patient is a 76 year old Caucasian male with an elevated PSA and BPH with LU TS who presents today for a 6 month follow up.  Elevated PSA Patient was referred by Dr. Doy Hutching for an elevated PSA. Patient was found to have a PSA of 4.83 ng/mL on 12/24/2016 during a routine medical exam.  His previous PSA was 3.5 ng/mL on 12/23/2015.  His repeated PSA was found to be 4.8 with a PSA, free of 0.86 resulting in a 23% probability of having prostate cancer.  His current PSA is 6.4 ng/mL on 07/29/2017.    BPH with LU TS His IPSS score today is 27, which is severe lower urinary tract symptomatology. He is mostly satisfied with his quality life due to his LUTS.  His PVR is 0 mL.   His previous I PSS score was 9/5.  His previous PVR was 0 mL.    His major complaints today are frequency, urgency, nocturia and incontinence.  He has had these symptoms for two years.  He denies any dysuria, hematuria or suprapubic pain.   He currently taking tamsulosin 0.4 mg bid.  He did not find the Myrbertriq effective.      He also denies any recent fevers, chills, nausea or vomiting.  He has a family history of PCa, with his brother being diagnosed with prostate cancer.        IPSS    Row Name 08/01/17 1000         International Prostate Symptom Score   How often have you had the sensation of not emptying your bladder? More than half the time     How often have you had to urinate less than every two hours? More than half the time     How often have you found you stopped and started again several times when you urinated? More than half the time     How often have you found it difficult to postpone urination? More  than half the time     How often have you had a weak urinary stream? More than half the time     How often have you had to strain to start urination? More than half the time     How many times did you typically get up at night to urinate? 3 Times     Total IPSS Score 27       Quality of Life due to urinary symptoms   If you were to spend the rest of your life with your urinary condition just the way it is now how would you feel about that? Mostly Disatisfied        Score:  1-7 Mild 8-19 Moderate 20-35 Severe  Erectile dysfunction His SHIM score is 8, which is moderate ED.  He has been having difficulty with erections for several years.  His previous SHIM score was 6.  His major complaint is lack of firmness for penetration.  His libido is preserved.   His risk factors for ED are age, spinal injury, DM, HTN, HLD, sleep apnea and blood pressure medications.   He denies any painful erections or curvatures with his erections.   He has  tried sildenafil in the past with mixed results.  He has a erect device that he would like some assistance with using.  He did not bring his device to this appointment.       SHIM    Row Name 08/01/17 1004         SHIM: Over the last 6 months:   How do you rate your confidence that you could get and keep an erection? Very Low     When you had erections with sexual stimulation, how often were your erections hard enough for penetration (entering your partner)? A Few Times (much less than half the time)     During sexual intercourse, how often were you able to maintain your erection after you had penetrated (entered) your partner? A Few Times (much less than half the time)     During sexual intercourse, how difficult was it to maintain your erection to completion of intercourse? Extremely Difficult     When you attempted sexual intercourse, how often was it satisfactory for you? A Few Times (much less than half the time)       SHIM Total Score   SHIM 8         Score: 1-7 Severe ED 8-11 Moderate ED 12-16 Mild-Moderate ED 17-21 Mild ED 22-25 No ED    PMH: Past Medical History:  Diagnosis Date  . Arthritis   . Bleeding disorder ([place])   . Diabetes mellitus without complication ([place])   . GERD (gastroesophageal reflux disease)   . Hypertension     Surgical History: Past Surgical History:  Procedure Laterality Date  . APPENDECTOMY    . CARPAL TUNNEL RELEASE    . GALLBLADDER SURGERY    . REPLACEMENT TOTAL KNEE Bilateral     Home Medications:  Allergies as of 08/01/2017   No Known Allergies     Medication List       Accurate as of 08/01/17 11:04 AM. Always use your most recent med list.          allopurinol 300 MG tablet Commonly known as:  ZYLOPRIM TAKE 1 TABLET ONE TIME DAILY   aspirin EC 81 MG tablet Take by mouth.   cholestyramine light 4 g packet Commonly known as:  PREVALITE Take by mouth.   [employer] VITAMIN B12 1000 MCG tablet Generic drug:  cyanocobalamin Take by mouth.   gabapentin 600 MG tablet Commonly known as:  NEURONTIN TAKE 1 TABLET THREE TIMES DAILY   hydrochlorothiazide 12.5 MG capsule Commonly known as:  MICROZIDE Take 12.5 mg by mouth daily.   HYDROcodone-acetaminophen 5-325 MG tablet Commonly known as:  NORCO/VICODIN 1 po q6h prn   Melatonin 5 MG Caps Take by mouth.   meloxicam 7.5 MG tablet Commonly known as:  MOBIC Take 7.5 mg by mouth daily.   metFORMIN 1000 MG tablet Commonly known as:  GLUCOPHAGE Take 1,000 mg by mouth 2 (two) times daily with a meal.   mirabegron ER 25 MG Tb24 tablet Commonly known as:  MYRBETRIQ Take 1 tablet (25 mg total) by mouth daily.   MULTI-VITAMINS Tabs Take by mouth.   omeprazole 40 MG capsule Commonly known as:  PRILOSEC TAKE 1 CAPSULE TWICE DAILY   oxybutynin 10 MG 24 hr tablet Commonly known as:  DITROPAN-XL Take 1 tablet (10 mg total) by mouth at bedtime.   pioglitazone 15 MG tablet Commonly known as:  ACTOS TAKE 1 TABLET ONE TIME  DAILY   polyethylene glycol-electrolytes 420 g solution Commonly known as:  NuLYTELY/GoLYTELY Take as directed for colonic prep.   predniSONE 10 MG tablet Commonly known as:  DELTASONE Taper 6-6-4-4-2-2-1-1-off   sildenafil 20 MG tablet Commonly known as:  REVATIO Take 20 mg by mouth as needed.   simvastatin 40 MG tablet Commonly known as:  ZOCOR TAKE 1 TABLET EVERY NIGHT   tamsulosin 0.4 MG Caps capsule Commonly known as:  FLOMAX TAKE 1 CAPSULE TWICE DAILY   traMADol 50 MG tablet Commonly known as:  ULTRAM Take by mouth every 6 (six) hours as needed.   warfarin 5 MG tablet Commonly known as:  COUMADIN Take by mouth. 2 days per week   warfarin 3 MG tablet Commonly known as:  COUMADIN Take by mouth. Five days per week       Allergies: No Known Allergies  Family History: Family History  Problem Relation Age of Onset  . Prostate cancer Brother   . Bladder Cancer Neg Hx   . Kidney cancer Neg Hx     Social History:  reports that he has quit smoking. He has never used smokeless tobacco. He reports that he drinks alcohol. He reports that he does not use drugs.  ROS: UROLOGY Frequent Urination?: Yes Hard to postpone urination?: Yes Burning/pain with urination?: No Get up at night to urinate?: Yes Leakage of urine?: Yes Urine stream starts and stops?: No Trouble starting stream?: No Do you have to strain to urinate?: No Blood in urine?: No Urinary tract infection?: No Sexually transmitted disease?: No Injury to kidneys or bladder?: No Painful intercourse?: No Weak stream?: No Erection problems?: Yes Penile pain?: No  Gastrointestinal Nausea?: No Vomiting?: No Indigestion/heartburn?: No Diarrhea?: No Constipation?: No  Constitutional Fever: No Night sweats?: No Weight loss?: No Fatigue?: No  Skin Skin rash/lesions?: No Itching?: No  Eyes Blurred vision?: No Double vision?: No  Ears/Nose/Throat Sore throat?: No Sinus problems?:  No  Hematologic/Lymphatic Swollen glands?: No Easy bruising?: No  Cardiovascular Leg swelling?: No Chest pain?: No  Respiratory Cough?: No Shortness of breath?: No  Endocrine Excessive thirst?: No  Musculoskeletal Back pain?: Yes Joint pain?: Yes  Neurological Headaches?: No Dizziness?: No  Psychologic Depression?: No Anxiety?: No  Physical Exam: BP (!) 157/78   Pulse 73   Ht 5\' 9"  (1.753 m)   Wt 266 lb 9.6 oz (120.9 kg)   BMI 39.37 kg/m   Constitutional: Well nourished. Alert and oriented, No acute distress. HEENT: [place] AT, moist mucus membranes. Trachea midline, no masses. Cardiovascular: No clubbing, cyanosis, or edema. Respiratory: Normal respiratory effort, no increased work of breathing. GI: Abdomen is soft, non tender, non distended, no abdominal masses. Liver and spleen not palpable.  No hernias appreciated.  Stool sample for occult testing is not indicated.   GU: No CVA tenderness.  No bladder fullness or masses.  Patient with circumcised phallus.  Urethral meatus is patent.  No penile discharge. No penile lesions or rashes. Scrotum without lesions, cysts, rashes and/or edema.  Testicles are located scrotally bilaterally. No masses are appreciated in the testicles. Left and right epididymis are normal. Rectal: Patient with  normal sphincter tone. Anus and perineum without scarring or rashes. No rectal masses are appreciated. Prostate is approximately 45 grams, irregular, no nodules are appreciated. Seminal vesicles are normal. Skin: No rashes, bruises or suspicious lesions. Lymph: No cervical or inguinal adenopathy. Neurologic: Grossly intact, no focal deficits, moving all 4 extremities. Psychiatric: Normal mood and affect.  Laboratory Data: PSA History  2.42 ng/mL on 10/25/2014  3.50 ng/mL on 12/23/2015  4.83 ng/mL on 12/24/2016  4.80 ng/mL on 01/08/2017 -free PSA 0.86  6.4  ng/mL on 07/29/2017  I reviewed the labs  Pertinent Imaging: Results for  Jimmy Greer, Jimmy Greer (MRN [id number]) as of 08/01/2017 10:42  Ref. Range 01/29/2017 10:08  Scan Result Unknown 0     Assessment & Plan:    1. Elevated PSA  - RTC in 3 months for repeated PSA  2. Irregular prostate exam  - we dicussed undergoing a biopsy at this time, monitoring more closely and bring him in at three months or keep his original schedule at 6 months - I explained that the rise in the PSA and the irregular exam may likely be the result of prostate cancer and that any treatment at this time would be geared toward palliative treatment vs curative  3. BPH with LUTS  - IPSS score is 27/4, it is worsening  - Continue conservative management, avoiding bladder irritants and timed voiding's  - Continue tamsulosin 0.4 mg bid   - Trial of oxybutynin XL 10 mg daily;  side effects of Dry eyes, dry mouth, constipation, mental confusion and/or urinary retention explained to the patient  - RTC in 3 months for I PSS and PVR  4. Erectile dysfunction  - SHIM score is 8   - Continue sildenafil 20 mg, 5 tablets at once two hours prior to intercourse on an empty stomach  - patient brought in his Erect Aid device and it appears that he is having trouble with the suction  - RTC in 6 months for repeat SHIM score  Return in about 3 months (around 11/01/2017) for IPSS, SHIM, PSA and exam.  These notes generated with voice recognition software. I apologize for typographical errors.  Zara Council, [facility] [address], [place], [place] [address] [phone]

## 2017-08-01 ENCOUNTER — Encounter: Payer: Self-pay | Admitting: Urology

## 2017-08-01 ENCOUNTER — Ambulatory Visit (INDEPENDENT_AMBULATORY_CARE_PROVIDER_SITE_OTHER): Payer: Medicare Other | Admitting: Urology

## 2017-08-01 VITALS — BP 157/78 | HR 73 | Ht 69.0 in | Wt 266.6 lb

## 2017-08-01 DIAGNOSIS — N400 Enlarged prostate without lower urinary tract symptoms: Secondary | ICD-10-CM

## 2017-08-01 DIAGNOSIS — N529 Male erectile dysfunction, unspecified: Secondary | ICD-10-CM

## 2017-08-01 DIAGNOSIS — I70209 Unspecified atherosclerosis of native arteries of extremities, unspecified extremity: Secondary | ICD-10-CM

## 2017-08-01 DIAGNOSIS — R972 Elevated prostate specific antigen [PSA]: Secondary | ICD-10-CM

## 2017-08-01 DIAGNOSIS — R3989 Other symptoms and signs involving the genitourinary system: Secondary | ICD-10-CM | POA: Diagnosis not present

## 2017-08-01 MED ORDER — OXYBUTYNIN CHLORIDE ER 10 MG PO TB24: 10.0000 mg | ORAL_TABLET | Freq: Every day | ORAL | 11 refills | Status: DC

## 2017-09-04 DIAGNOSIS — G629 Polyneuropathy, unspecified: Secondary | ICD-10-CM | POA: Insufficient documentation

## 2017-09-25 ENCOUNTER — Ambulatory Visit: Payer: Medicare Other | Admitting: Anesthesiology

## 2017-09-25 ENCOUNTER — Encounter: Payer: Self-pay | Admitting: Anesthesiology

## 2017-09-25 ENCOUNTER — Ambulatory Visit
Admission: RE | Admit: 2017-09-25 | Discharge: 2017-09-25 | Disposition: A | Discharge status: Home / Self Care | Payer: Medicare Other | Source: Ambulatory Visit | Attending: Internal Medicine | Admitting: Internal Medicine

## 2017-09-25 ENCOUNTER — Encounter
Admission: RE | Disposition: A | Discharge status: Home / Self Care | Payer: Self-pay | Source: Ambulatory Visit | Attending: Internal Medicine

## 2017-09-25 DIAGNOSIS — Z7984 Long term (current) use of oral hypoglycemic drugs: Secondary | ICD-10-CM | POA: Insufficient documentation

## 2017-09-25 DIAGNOSIS — Z7901 Long term (current) use of anticoagulants: Secondary | ICD-10-CM | POA: Diagnosis not present

## 2017-09-25 DIAGNOSIS — M1990 Unspecified osteoarthritis, unspecified site: Secondary | ICD-10-CM | POA: Insufficient documentation

## 2017-09-25 DIAGNOSIS — K3189 Other diseases of stomach and duodenum: Secondary | ICD-10-CM | POA: Diagnosis not present

## 2017-09-25 DIAGNOSIS — K219 Gastro-esophageal reflux disease without esophagitis: Secondary | ICD-10-CM | POA: Insufficient documentation

## 2017-09-25 DIAGNOSIS — Z1211 Encounter for screening for malignant neoplasm of colon: Secondary | ICD-10-CM | POA: Insufficient documentation

## 2017-09-25 DIAGNOSIS — Z87442 Personal history of urinary calculi: Secondary | ICD-10-CM | POA: Insufficient documentation

## 2017-09-25 DIAGNOSIS — K5730 Diverticulosis of large intestine without perforation or abscess without bleeding: Secondary | ICD-10-CM | POA: Insufficient documentation

## 2017-09-25 DIAGNOSIS — Z87891 Personal history of nicotine dependence: Secondary | ICD-10-CM | POA: Diagnosis not present

## 2017-09-25 DIAGNOSIS — E1151 Type 2 diabetes mellitus with diabetic peripheral angiopathy without gangrene: Secondary | ICD-10-CM | POA: Diagnosis not present

## 2017-09-25 DIAGNOSIS — G4730 Sleep apnea, unspecified: Secondary | ICD-10-CM | POA: Insufficient documentation

## 2017-09-25 DIAGNOSIS — Z7982 Long term (current) use of aspirin: Secondary | ICD-10-CM | POA: Insufficient documentation

## 2017-09-25 DIAGNOSIS — Z86010 Personal history of colonic polyps: Secondary | ICD-10-CM | POA: Insufficient documentation

## 2017-09-25 DIAGNOSIS — Z79899 Other long term (current) drug therapy: Secondary | ICD-10-CM | POA: Diagnosis not present

## 2017-09-25 DIAGNOSIS — Z9989 Dependence on other enabling machines and devices: Secondary | ICD-10-CM | POA: Insufficient documentation

## 2017-09-25 DIAGNOSIS — I10 Essential (primary) hypertension: Secondary | ICD-10-CM | POA: Insufficient documentation

## 2017-09-25 DIAGNOSIS — K640 First degree hemorrhoids: Secondary | ICD-10-CM | POA: Diagnosis not present

## 2017-09-25 HISTORY — PX: ESOPHAGOGASTRODUODENOSCOPY (EGD) WITH PROPOFOL: SHX5813

## 2017-09-25 HISTORY — PX: COLONOSCOPY WITH PROPOFOL: SHX5780

## 2017-09-25 LAB — GLUCOSE, CAPILLARY: GLUCOSE-CAPILLARY: 140 mg/dL — AB (ref 65–99)

## 2017-09-25 SURGERY — COLONOSCOPY WITH PROPOFOL
Anesthesia: General

## 2017-09-25 MED ORDER — PROPOFOL 500 MG/50ML IV EMUL
INTRAVENOUS | Status: DC | PRN
  Administered 2017-09-25: 120 ug/kg/min via INTRAVENOUS

## 2017-09-25 MED ORDER — BUTAMBEN-TETRACAINE-BENZOCAINE 2-2-14 % EX AERO
INHALATION_SPRAY | CUTANEOUS | Status: AC
  Filled 2017-09-25: qty 5

## 2017-09-25 MED ORDER — LIDOCAINE HCL (CARDIAC) 20 MG/ML IV SOLN
INTRAVENOUS | Status: DC | PRN
  Administered 2017-09-25: 30 mg via INTRAVENOUS

## 2017-09-25 MED ORDER — GLYCOPYRROLATE 0.2 MG/ML IJ SOLN
INTRAMUSCULAR | Status: DC | PRN
  Administered 2017-09-25: 0.1 mg via INTRAVENOUS

## 2017-09-25 MED ORDER — PROPOFOL 500 MG/50ML IV EMUL
INTRAVENOUS | Status: AC
  Filled 2017-09-25: qty 50

## 2017-09-25 MED ORDER — FENTANYL CITRATE (PF) 100 MCG/2ML IJ SOLN
INTRAMUSCULAR | Status: AC
  Filled 2017-09-25: qty 2

## 2017-09-25 MED ORDER — MIDAZOLAM HCL 2 MG/2ML IJ SOLN
INTRAMUSCULAR | Status: DC | PRN
  Administered 2017-09-25: 2 mg via INTRAVENOUS

## 2017-09-25 MED ORDER — BUTAMBEN-TETRACAINE-BENZOCAINE 2-2-14 % EX AERO
INHALATION_SPRAY | CUTANEOUS | Status: DC | PRN
  Administered 2017-09-25: 1 via TOPICAL

## 2017-09-25 MED ORDER — LIDOCAINE HCL (PF) 2 % IJ SOLN
INTRAMUSCULAR | Status: AC
  Filled 2017-09-25: qty 10

## 2017-09-25 MED ORDER — FENTANYL CITRATE (PF) 100 MCG/2ML IJ SOLN
INTRAMUSCULAR | Status: DC | PRN
  Administered 2017-09-25: 50 ug via INTRAVENOUS

## 2017-09-25 MED ORDER — MIDAZOLAM HCL 2 MG/2ML IJ SOLN
INTRAMUSCULAR | Status: AC
  Filled 2017-09-25: qty 2

## 2017-09-25 MED ORDER — SODIUM CHLORIDE 0.9 % IV SOLN
INTRAVENOUS | Status: DC
  Administered 2017-09-25: 1000 mL via INTRAVENOUS

## 2017-09-25 MED ORDER — GLYCOPYRROLATE 0.2 MG/ML IJ SOLN
INTRAMUSCULAR | Status: AC
  Filled 2017-09-25: qty 1

## 2017-09-25 NOTE — Anesthesia Procedure Notes (Signed)
Performed by: Vaughan Sine Pre-anesthesia Checklist: Patient identified, Emergency Drugs available, Suction available, Patient being monitored and Timeout performed Patient Re-evaluated:Patient Re-evaluated prior to induction Oxygen Delivery Method: Nasal cannula Induction Type: IV induction Airway Equipment and Method: Bite block Placement Confirmation: CO2 detector and positive ETCO2

## 2017-09-25 NOTE — H&P (Signed)
Outpatient short stay form Pre-procedure 09/25/2017 8:18 AM Jorrell Kuster K. Alice Reichert, M.D.  Primary Physician: Fulton Reek, M.D.  Reason for visit:  Chronic GERD, chronic diarrhea, personal hx of colon polyps.  History of present illness: A 76 year old Patient presents with chronic recurrent loose stools approximately 2-3 per day. No rectal bleeding. Patient has a history of chronic GERD, maintained with omeprazole 40 mg twice a day and has occasional breakthrough symptoms. No reported dysphagia. Has a history of colon polyps from an outside institution.    Current Facility-Administered Medications:  .  0.9 %  sodium chloride infusion, , Intravenous, Continuous, [place], Benay Pike, MD, Last Rate: 20 mL/hr at 09/25/17 0809, 1,000 mL at 09/25/17 0809  Prescriptions Prior to Admission  Medication Sig Dispense Refill Last Dose  . allopurinol (ZYLOPRIM) 300 MG tablet TAKE 1 TABLET ONE TIME DAILY   09/24/2017 at Unknown time  . aspirin EC 81 MG tablet Take by mouth.   09/24/2017 at Unknown time  . cholestyramine light (PREVALITE) 4 g packet Take by mouth.   09/24/2017 at Unknown time  . cyanocobalamin ([employer] VITAMIN B12) 1000 MCG tablet Take by mouth.   Past Week at Unknown time  . gabapentin (NEURONTIN) 600 MG tablet TAKE 1 TABLET THREE TIMES DAILY   09/24/2017 at Unknown time  . hydrochlorothiazide (MICROZIDE) 12.5 MG capsule Take 12.5 mg by mouth daily.   09/24/2017 at Unknown time  . HYDROcodone-acetaminophen (NORCO/VICODIN) 5-325 MG tablet 1 po q6h prn   Past Week at Unknown time  . Melatonin 5 MG CAPS Take by mouth.   Past Week at Unknown time  . meloxicam (MOBIC) 7.5 MG tablet Take 7.5 mg by mouth daily.   Past Week at Unknown time  . metFORMIN (GLUCOPHAGE) 1000 MG tablet Take 1,000 mg by mouth 2 (two) times daily with a meal.   Past Week at Unknown time  . mirabegron ER (MYRBETRIQ) 25 MG TB24 tablet Take 1 tablet (25 mg total) by mouth daily. 30 tablet 12 Past Week at Unknown time  . Multiple  Vitamin (MULTI-VITAMINS) TABS Take by mouth.   Past Week at Unknown time  . omeprazole (PRILOSEC) 40 MG capsule TAKE 1 CAPSULE TWICE DAILY   Past Week at Unknown time  . oxybutynin (DITROPAN-XL) 10 MG 24 hr tablet Take 1 tablet (10 mg total) by mouth at bedtime. 30 tablet 11 Past Week at Unknown time  . pioglitazone (ACTOS) 15 MG tablet TAKE 1 TABLET ONE TIME DAILY   Past Week at Unknown time  . polyethylene glycol-electrolytes (NULYTELY/GOLYTELY) 420 g solution Take as directed for colonic prep.   Past Week at Unknown time  . predniSONE (DELTASONE) 10 MG tablet Taper 6-6-4-4-2-2-1-1-off   Past Week at Unknown time  . sildenafil (REVATIO) 20 MG tablet Take 20 mg by mouth as needed.   Past Week at Unknown time  . simvastatin (ZOCOR) 40 MG tablet TAKE 1 TABLET EVERY NIGHT   Past Week at Unknown time  . tamsulosin (FLOMAX) 0.4 MG CAPS capsule TAKE 1 CAPSULE TWICE DAILY   Past Week at Unknown time  . traMADol (ULTRAM) 50 MG tablet Take by mouth every 6 (six) hours as needed.   Past Week at Unknown time  . warfarin (COUMADIN) 3 MG tablet Take by mouth. Five days per week   Past Week at Unknown time  . warfarin (COUMADIN) 5 MG tablet Take by mouth. 2 days per week   Past Week at Unknown time     No Known Allergies  Past Medical History:  Diagnosis Date  . Arthritis   . Bleeding disorder ([place])   . Diabetes mellitus without complication ([place])   . GERD (gastroesophageal reflux disease)   . Hypertension     Review of systems:      Physical Exam  General appearance: alert, cooperative and appears stated age Resp: clear to auscultation bilaterally  CV: Regular rate. Abdomen: Soft, nontender no masses. Bowel sounds positive.   Planned procedures: Proceed with EGD and colonoscopy.The patient understands the nature of the planned procedure, indications, risks, alternatives and potential complications including but not limited to bleeding, infection, perforation, damage to internal organs and  possible oversedation/side effects from anesthesia. The patient agrees and gives consent to proceed.  Please refer to procedure notes for findings, recommendations and patient disposition/instructions.    Anaiyah Anglemyer K. Alice Reichert, M.D. Gastroenterology 09/25/2017  8:18 AM

## 2017-09-25 NOTE — Anesthesia Preprocedure Evaluation (Signed)
Anesthesia Evaluation  Patient identified by MRN, date of birth, ID band Patient awake    Reviewed: Allergy & Precautions, NPO status , Patient's Chart, lab work & pertinent test results  History of Anesthesia Complications Negative for: history of anesthetic complications  Airway Mallampati: III       Dental   Pulmonary sleep apnea and Continuous Positive Airway Pressure Ventilation , neg COPD, former smoker,           Cardiovascular hypertension, Pt. on medications + Peripheral Vascular Disease  (-) Past MI and (-) CHF (-) dysrhythmias (-) Valvular Problems/Murmurs     Neuro/Psych neg Seizures negative neurological ROS     GI/Hepatic Neg liver ROS, GERD  Medicated and Controlled,  Endo/Other  diabetes, Type 2, Oral Hypoglycemic Agents  Renal/GU Renal disease (stones)     Musculoskeletal   Abdominal   Peds  Hematology   Anesthesia Other Findings   Reproductive/Obstetrics                             Anesthesia Physical Anesthesia Plan  ASA: III  Anesthesia Plan: General   Post-op Pain Management:    Induction: Intravenous  PONV Risk Score and Plan: Propofol infusion  Airway Management Planned: Nasal Cannula  Additional Equipment:   Intra-op Plan:   Post-operative Plan:   Informed Consent: I have reviewed the patients History and Physical, chart, labs and discussed the procedure including the risks, benefits and alternatives for the proposed anesthesia with the patient or authorized representative who has indicated his/her understanding and acceptance.     Plan Discussed with:   Anesthesia Plan Comments:         Anesthesia Quick Evaluation

## 2017-09-25 NOTE — Anesthesia Post-op Follow-up Note (Signed)
Anesthesia QCDR form completed.        

## 2017-09-25 NOTE — Transfer of Care (Signed)
Immediate Anesthesia Transfer of Care Note  Patient: Curren Mohrmann [facility]  Procedure(s) Performed: COLONOSCOPY WITH PROPOFOL (N/A ) ESOPHAGOGASTRODUODENOSCOPY (EGD) WITH PROPOFOL (N/A )  Patient Location: PACU  Anesthesia Type:General  Level of Consciousness: awake and sedated  Airway & Oxygen Therapy: Patient Spontanous Breathing and Patient connected to nasal cannula oxygen  Post-op Assessment: Report given to RN and Post -op Vital signs reviewed and stable  Post vital signs: Reviewed and stable  Last Vitals:  Vitals:   09/25/17 0750  BP: 129/66  Pulse: 84  Resp: 16  Temp: (!) 36.3 C  SpO2: 97%    Last Pain:  Vitals:   09/25/17 0750  TempSrc: Tympanic  PainSc: 2          Complications: No apparent anesthesia complications

## 2017-09-25 NOTE — Anesthesia Postprocedure Evaluation (Signed)
Anesthesia Post Note  Patient: Jimmy Greer  Procedure(s) Performed: COLONOSCOPY WITH PROPOFOL (N/A ) ESOPHAGOGASTRODUODENOSCOPY (EGD) WITH PROPOFOL (N/A )  Patient location during evaluation: Endoscopy Anesthesia Type: General Level of consciousness: awake and alert Pain management: pain level controlled Vital Signs Assessment: post-procedure vital signs reviewed and stable Respiratory status: spontaneous breathing and respiratory function stable Cardiovascular status: stable Anesthetic complications: no     Last Vitals:  Vitals:   09/25/17 0850 09/25/17 0900  BP: 98/67 118/72  Pulse: 61 (!) 58  Resp: 15 14  Temp: 36.6 C   SpO2: 93% 93%    Last Pain:  Vitals:   09/25/17 0850  TempSrc: Tympanic  PainSc:                  [name] K

## 2017-09-25 NOTE — Op Note (Signed)
[facility] Gastroenterology Patient Name: Jimmy Greer Procedure Date: 09/25/2017 8:28 AM MRN: [id number] Account #: [id number] Date of Birth: January 25, 1941 Admit Type: Outpatient Age: 76 Room: [facility] ENDO ROOM 4 Gender: Male Note Status: Finalized Procedure:            Upper GI endoscopy Indications:          Esophageal reflux Providers:            Benay Pike. Alice Reichert MD, MD Referring MD:         Leonie Douglas. Doy Hutching, MD (Referring MD) Medicines:            Propofol per Anesthesia Complications:        No immediate complications. Procedure:            Pre-Anesthesia Assessment:                       - ASA Grade Assessment: III - A patient with severe                        systemic disease.                       - After reviewing the risks and benefits, the patient                        was deemed in satisfactory condition to undergo the                        procedure.                       After obtaining informed consent, the endoscope was                        passed under direct vision. Throughout the procedure,                        the patient's blood pressure, pulse, and oxygen                        saturations were monitored continuously. The Endoscope                        was introduced through the mouth, and advanced to the                        third part of duodenum. The upper GI endoscopy was                        accomplished without difficulty. The patient tolerated                        the procedure well. Findings:      The Z-line was irregular and was found 38 cm from the incisors. Mucosa       was biopsied with a cold forceps for histology. One specimen bottle was       sent to pathology.      Diffuse mildly erythematous mucosa without bleeding was found in the       entire examined stomach.      The examined duodenum was normal. Impression:           -  Z-line irregular, 38 cm from the incisors. Biopsied.                       -  Erythematous mucosa in the stomach.                       - Normal examined duodenum. Recommendation:       - See the other procedure note for documentation of                        additional recommendations. Procedure Code(s):    --- Professional ---                       [phone], Esophagogastroduodenoscopy, flexible, transoral;                        with biopsy, single or multiple Diagnosis Code(s):    --- Professional ---                       K22.8, Other specified diseases of esophagus                       K31.89, Other diseases of stomach and duodenum                       K21.9, Gastro-esophageal reflux disease without                        esophagitis CPT copyright 2016 American Medical Association. All rights reserved. The codes documented in this report are preliminary and upon coder review may  be revised to meet current compliance requirements. Efrain Sella MD, MD 09/25/2017 8:54:12 AM This report has been signed electronically. Number of Addenda: 0 Note Initiated On: 09/25/2017 8:28 AM      [facility]

## 2017-09-25 NOTE — Op Note (Signed)
[facility] Gastroenterology Patient Name: Jimmy Greer Procedure Date: 09/25/2017 8:27 AM MRN: [id number] Account #: [id number] Date of Birth: 01-02-1941 Admit Type: Outpatient Age: 76 Room: [facility] ENDO ROOM 4 Gender: Male Note Status: Finalized Procedure:            Colonoscopy Indications:          High risk colon cancer surveillance: Personal history                        of colonic polyps Providers:            Benay Pike. Alice Reichert MD, MD Referring MD:         Leonie Douglas. Doy Hutching, MD (Referring MD) Medicines:            Propofol per Anesthesia Complications:        No immediate complications. Procedure:            Pre-Anesthesia Assessment:                       - ASA Grade Assessment: III - A patient with severe                        systemic disease.                       - After reviewing the risks and benefits, the patient                        was deemed in satisfactory condition to undergo the                        procedure.                       After obtaining informed consent, the colonoscope was                        passed under direct vision. Throughout the procedure,                        the patient's blood pressure, pulse, and oxygen                        saturations were monitored continuously. The                        Colonoscope was introduced through the anus and                        advanced to the the cecum, identified by appendiceal                        orifice and ileocecal valve. The colonoscopy was                        performed without difficulty. The patient tolerated the                        procedure well. The quality of the bowel preparation  was excellent. The ileocecal valve, appendiceal                        orifice, and rectum were photographed. Findings:      A few small-mouthed diverticula were found in the sigmoid colon.      Non-bleeding internal hemorrhoids were found during  retroflexion. The       hemorrhoids were mild and Grade I (internal hemorrhoids that do not       prolapse).      The exam was otherwise without abnormality. Impression:           - Diverticulosis in the sigmoid colon.                       - Non-bleeding internal hemorrhoids.                       - The examination was otherwise normal.                       - No specimens collected. Recommendation:       - Patient has a contact number available for                        emergencies. The signs and symptoms of potential                        delayed complications were discussed with the patient.                        Return to normal activities tomorrow. Written discharge                        instructions were provided to the patient.                       - Resume previous diet.                       - Continue present medications.                       - Await pathology results.                       - Repeat colonoscopy in 5 years for surveillance.                       - Return to GI office PRN.                       - Telephone GI clinic for pathology results in 1 week.                       - The findings and recommendations were discussed with                        the patient.                       - The findings and recommendations were discussed with  the patient's family. Procedure Code(s):    --- Professional ---                       K3546, Colorectal cancer screening; colonoscopy on                        individual at high risk CPT copyright 2016 American Medical Association. All rights reserved. The codes documented in this report are preliminary and upon coder review may  be revised to meet current compliance requirements. Efrain Sella MD, MD 09/25/2017 8:57:53 AM This report has been signed electronically. Number of Addenda: 0 Note Initiated On: 09/25/2017 8:27 AM Scope Withdrawal Time: 0 hours 7 minutes 6 seconds  Total Procedure  Duration: 0 hours 9 minutes 24 seconds       [facility]

## 2017-09-25 NOTE — H&P (Deleted)
Outpatient short stay form Pre-procedure 09/25/2017 7:46 AM Jimmy Greer K. Alice Reichert, M.D.  Primary Physician: Loura Back, M.D.  Reason for visit:  Chronic Ulcerative colitis.  History of present illness:  76 y/o male with hx of [place] s/p OLT at [facility] presents with hx of CUC. Currently he denies diarrhea, hematochezia or hemetemesis. Appetite appears good. No N or V.  He is here for surveillance colonoscopy. He takes balsalazide and his transplant team at [facility] has reportedly told him he can decrease his balsalazide if the results of today's exam shows no active mucosal disease.     Current Facility-Administered Medications:  .  0.9 %  sodium chloride infusion, , Intravenous, Continuous, Shyann Hefner, Benay Pike, MD  Prescriptions Prior to Admission  Medication Sig Dispense Refill Last Dose  . allopurinol (ZYLOPRIM) 300 MG tablet TAKE 1 TABLET ONE TIME DAILY   Taking  . aspirin EC 81 MG tablet Take by mouth.   Taking  . cholestyramine light (PREVALITE) 4 g packet Take by mouth.   Not Taking  . cyanocobalamin (CVS VITAMIN B12) 1000 MCG tablet Take by mouth.   Taking  . gabapentin (NEURONTIN) 600 MG tablet TAKE 1 TABLET THREE TIMES DAILY   Taking  . hydrochlorothiazide (MICROZIDE) 12.5 MG capsule Take 12.5 mg by mouth daily.   Taking  . HYDROcodone-acetaminophen (NORCO/VICODIN) 5-325 MG tablet 1 po q6h prn   Taking  . Melatonin 5 MG CAPS Take by mouth.   Taking  . meloxicam (MOBIC) 7.5 MG tablet Take 7.5 mg by mouth daily.   Taking  . metFORMIN (GLUCOPHAGE) 1000 MG tablet Take 1,000 mg by mouth 2 (two) times daily with a meal.   Taking  . mirabegron ER (MYRBETRIQ) 25 MG TB24 tablet Take 1 tablet (25 mg total) by mouth daily. (Patient not taking: Reported on 05/17/2017) 30 tablet 12 Not Taking  . Multiple Vitamin (MULTI-VITAMINS) TABS Take by mouth.   Taking  . omeprazole (PRILOSEC) 40 MG capsule TAKE 1 CAPSULE TWICE DAILY   Taking  . oxybutynin (DITROPAN-XL) 10 MG 24 hr tablet Take 1 tablet (10 mg  total) by mouth at bedtime. 30 tablet 11   . pioglitazone (ACTOS) 15 MG tablet TAKE 1 TABLET ONE TIME DAILY   Taking  . polyethylene glycol-electrolytes (NULYTELY/GOLYTELY) 420 g solution Take as directed for colonic prep.   Not Taking  . predniSONE (DELTASONE) 10 MG tablet Taper 6-6-4-4-2-2-1-1-off   Not Taking  . sildenafil (REVATIO) 20 MG tablet Take 20 mg by mouth as needed.   Taking  . simvastatin (ZOCOR) 40 MG tablet TAKE 1 TABLET EVERY NIGHT   Taking  . tamsulosin (FLOMAX) 0.4 MG CAPS capsule TAKE 1 CAPSULE TWICE DAILY   Taking  . traMADol (ULTRAM) 50 MG tablet Take by mouth every 6 (six) hours as needed.   Taking  . warfarin (COUMADIN) 3 MG tablet Take by mouth. Five days per week   Taking  . warfarin (COUMADIN) 5 MG tablet Take by mouth. 2 days per week   Taking     No Known Allergies   Past Medical History:  Diagnosis Date  . Arthritis   . Bleeding disorder ([place])   . Diabetes mellitus without complication ([place])   . GERD (gastroesophageal reflux disease)   . Hypertension     Review of systems:      Physical Exam  General appearance: alert, cooperative and appears stated age Resp: clear to auscultation bilaterally Cardio: regular rate and rhythm, S1, S2 normal, no murmur, click, rub or  gallop GI: soft, non-tender; bowel sounds normal; no masses,  no organomegaly     Planned procedures: Proceed with colonoscopy. The patient understands the nature of the planned procedure, indications, risks, alternatives and potential complications including but not limited to bleeding, infection, perforation, damage to internal organs and possible oversedation/side effects from anesthesia. The patient agrees and gives consent to proceed.  Please refer to procedure notes for findings, recommendations and patient disposition/instructions.    Lataria Courser K. Alice Reichert, M.D. Gastroenterology 09/25/2017  7:46 AM

## 2017-09-25 NOTE — Interval H&P Note (Deleted)
History and Physical Interval Note:  09/25/2017 7:50 AM  Jimmy Greer  has presented today for surgery, with the diagnosis of GERD PERSONAL HX.COLON POLYPS  The various methods of treatment have been discussed with the patient and family. After consideration of risks, benefits and other options for treatment, the patient has consented to  Procedure(s): COLONOSCOPY WITH PROPOFOL (N/A) ESOPHAGOGASTRODUODENOSCOPY (EGD) WITH PROPOFOL (N/A) as a surgical intervention .  The patient's history has been reviewed, patient examined, no change in status, stable for surgery.  I have reviewed the patient's chart and labs.  Questions were answered to the patient's satisfaction.     [place], [place]

## 2017-09-26 ENCOUNTER — Encounter: Payer: Self-pay | Admitting: Internal Medicine

## 2017-09-26 LAB — SURGICAL PATHOLOGY

## 2017-10-29 ENCOUNTER — Other Ambulatory Visit: Payer: Self-pay

## 2017-10-29 ENCOUNTER — Other Ambulatory Visit: Payer: Medicare Other

## 2017-10-29 DIAGNOSIS — R972 Elevated prostate specific antigen [PSA]: Secondary | ICD-10-CM

## 2017-10-30 LAB — PSA: PROSTATE SPECIFIC AG, SERUM: 6.3 ng/mL — AB (ref 0.0–4.0)

## 2017-10-30 NOTE — Progress Notes (Signed)
10/31/2017 9:35 PM   Jimmy Greer 13-May-1941 [id number]  Referring provider: Idelle Crouch, MD [place] [facility] [place], [place] [address]  Chief Complaint  Patient presents with  . Benign Prostatic Hypertrophy  . Elevated PSA    HPI: Patient is a 76 year old Caucasian male with an elevated PSA and BPH with LU TS, ED and irregular prostate exam who presents today for a 6 month follow up.  Elevated PSA Patient was referred by Dr. Doy Hutching for an elevated PSA. Patient was found to have a PSA of 4.83 ng/mL on 12/24/2016 during a routine medical exam.  His previous PSA was 3.5 ng/mL on 12/23/2015.  His repeated PSA was found to be 4.8 with a PSA, free of 0.86 resulting in a 23% probability of having prostate cancer.  His current PSA is 6.3 ng/mL on 10/29/2017.    BPH with LU TS His IPSS score today is 19, which is moderate lower urinary tract symptomatology. He is mixed with his quality life due to his LUTS.  His previous I PSS score was 27/2.  His previous PVR was 0 mL.  His major complaints todays are frequency, urgency, nocturia, incontinence, intermittency and a weak urinary stream.  He has had these symptoms for two years.  He denies any dysuria, hematuria or suprapubic pain.  He currently taking tamsulosin 0.4 mg bid and oxybutynin XL 10 mg daily.   He did not find the Myrbertriq effective.  He also denies any recent fevers, chills, nausea or vomiting.  He has a family history of PCa, with his brother being diagnosed with prostate cancer.    IPSS    Row Name 10/31/17 1100         International Prostate Symptom Score   How often have you had the sensation of not emptying your bladder?  More than half the time     How often have you had to urinate less than every two hours?  More than half the time     How often have you found you stopped and started again several times when you urinated?  More than half the time     How often have you found it  difficult to postpone urination?  Not at All     How often have you had a weak urinary stream?  About half the time     How often have you had to strain to start urination?  Less than 1 in 5 times     How many times did you typically get up at night to urinate?  3 Times     Total IPSS Score  19       Quality of Life due to urinary symptoms   If you were to spend the rest of your life with your urinary condition just the way it is now how would you feel about that?  Mixed        Score:  1-7 Mild 8-19 Moderate 20-35 Severe  Erectile dysfunction His SHIM score is 7, which is severe ED.  His previous SHIM score was 5.  He has been having difficulty with erections for several years.  His previous SHIM score was 6.  His major complaint is lack of firmness for penetration.  His libido is preserved.   His risk factors for ED are age, spinal injury, DM, HTN, HLD, sleep apnea and blood pressure medications.   He denies any painful erections or curvatures with his erections.  He has tried sildenafil in the past with mixed results.  He has a erect aid device.   SHIM    Row Name 10/31/17 1127         SHIM: Over the last 6 months:   How do you rate your confidence that you could get and keep an erection?  Very Low     When you had erections with sexual stimulation, how often were your erections hard enough for penetration (entering your partner)?  Almost Never or Never     During sexual intercourse, how often were you able to maintain your erection after you had penetrated (entered) your partner?  A Few Times (much less than half the time)     During sexual intercourse, how difficult was it to maintain your erection to completion of intercourse?  Extremely Difficult     When you attempted sexual intercourse, how often was it satisfactory for you?  A Few Times (much less than half the time)       SHIM Total Score   SHIM  7        Score: 1-7 Severe ED 8-11 Moderate ED 12-16 Mild-Moderate  ED 17-21 Mild ED 22-25 No ED    PMH: Past Medical History:  Diagnosis Date  . Arthritis   . Bleeding disorder ([place])   . Diabetes mellitus without complication ([place])   . GERD (gastroesophageal reflux disease)   . Hypertension     Surgical History: Past Surgical History:  Procedure Laterality Date  . APPENDECTOMY    . CARPAL TUNNEL RELEASE    . COLONOSCOPY WITH PROPOFOL N/A 09/25/2017   Procedure: COLONOSCOPY WITH PROPOFOL;  Surgeon: Toledo, Benay Pike, MD;  Location: [facility] ENDOSCOPY;  Service: Endoscopy;  Laterality: N/A;  . ESOPHAGOGASTRODUODENOSCOPY (EGD) WITH PROPOFOL N/A 09/25/2017   Procedure: ESOPHAGOGASTRODUODENOSCOPY (EGD) WITH PROPOFOL;  Surgeon: Toledo, Benay Pike, MD;  Location: [facility] ENDOSCOPY;  Service: Endoscopy;  Laterality: N/A;  . GALLBLADDER SURGERY    . REPLACEMENT TOTAL KNEE Bilateral     Home Medications:  Allergies as of 10/31/2017   No Known Allergies     Medication List        Accurate as of 10/31/17 11:59 PM. Always use your most recent med list.          allopurinol 300 MG tablet Commonly known as:  ZYLOPRIM TAKE 1 TABLET ONE TIME DAILY   aspirin EC 81 MG tablet Take by mouth.   CVS VITAMIN B12 1000 MCG tablet Generic drug:  cyanocobalamin Take by mouth.   gabapentin 600 MG tablet Commonly known as:  NEURONTIN TAKE 1 TABLET THREE TIMES DAILY   hydrochlorothiazide 12.5 MG capsule Commonly known as:  MICROZIDE Take 12.5 mg by mouth daily.   HYDROcodone-acetaminophen 5-325 MG tablet Commonly known as:  NORCO/VICODIN 1 po q6h prn   Melatonin 5 MG Caps Take by mouth.   meloxicam 7.5 MG tablet Commonly known as:  MOBIC Take 7.5 mg by mouth daily.   metFORMIN 1000 MG tablet Commonly known as:  GLUCOPHAGE Take 1,000 mg by mouth 2 (two) times daily with a meal.   MULTI-VITAMINS Tabs Take by mouth.   omeprazole 40 MG capsule Commonly known as:  PRILOSEC TAKE 1 CAPSULE TWICE DAILY   oxybutynin 10 MG 24 hr tablet Commonly  known as:  DITROPAN-XL Take 1 tablet (10 mg total) by mouth at bedtime.   pioglitazone 15 MG tablet Commonly known as:  ACTOS TAKE 1 TABLET ONE TIME DAILY   sildenafil 20 MG  tablet Commonly known as:  REVATIO Take 20 mg by mouth as needed.   simvastatin 40 MG tablet Commonly known as:  ZOCOR TAKE 1 TABLET EVERY NIGHT   tamsulosin 0.4 MG Caps capsule Commonly known as:  FLOMAX TAKE 1 CAPSULE TWICE DAILY   traMADol 50 MG tablet Commonly known as:  ULTRAM Take by mouth every 6 (six) hours as needed.   warfarin 5 MG tablet Commonly known as:  COUMADIN Take by mouth. 2 days per week   warfarin 3 MG tablet Commonly known as:  COUMADIN Take 3 mg by mouth. Five days per week       Allergies: No Known Allergies  Family History: Family History  Problem Relation Age of Onset  . Prostate cancer Brother   . Bladder Cancer Neg Hx   . Kidney cancer Neg Hx     Social History:  reports that he has quit smoking. he has never used smokeless tobacco. He reports that he drinks alcohol. He reports that he does not use drugs.  ROS: UROLOGY Frequent Urination?: Yes Hard to postpone urination?: Yes Burning/pain with urination?: No Get up at night to urinate?: Yes Leakage of urine?: Yes Urine stream starts and stops?: Yes Trouble starting stream?: No Do you have to strain to urinate?: No Blood in urine?: No Urinary tract infection?: No Sexually transmitted disease?: No Injury to kidneys or bladder?: No Painful intercourse?: No Weak stream?: Yes Erection problems?: No Penile pain?: Yes  Gastrointestinal Nausea?: No Vomiting?: No Indigestion/heartburn?: Yes Diarrhea?: Yes Constipation?: No  Constitutional Fever: No Night sweats?: No Weight loss?: No Fatigue?: No  Skin Skin rash/lesions?: No Itching?: No  Eyes Blurred vision?: No Double vision?: No  Ears/Nose/Throat Sore throat?: No Sinus problems?: No  Hematologic/Lymphatic Swollen glands?: No Easy  bruising?: Yes  Cardiovascular Leg swelling?: Yes Chest pain?: No  Respiratory Cough?: No Shortness of breath?: Yes  Endocrine Excessive thirst?: No  Musculoskeletal Back pain?: Yes Joint pain?: Yes  Neurological Headaches?: No Dizziness?: No  Psychologic Depression?: No Anxiety?: No  Physical Exam: BP 132/74   Pulse 83   Ht 5' 9.5" (1.765 m)   Wt 264 lb 3.2 oz (119.8 kg)   BMI 38.46 kg/m   Constitutional: Well nourished. Alert and oriented, No acute distress. HEENT: [place] AT, moist mucus membranes. Trachea midline, no masses. Cardiovascular: No clubbing, cyanosis, or edema. Respiratory: Normal respiratory effort, no increased work of breathing. GI: Abdomen is soft, non tender, non distended, no abdominal masses. Liver and spleen not palpable.  No hernias appreciated.  Stool sample for occult testing is not indicated.   GU: No CVA tenderness.  No bladder fullness or masses.  Patient with circumcised phallus.  Urethral meatus is patent.  No penile discharge. No penile lesions or rashes. Scrotum without lesions, cysts, rashes and/or edema.  Testicles are located scrotally bilaterally. No masses are appreciated in the testicles. Left and right epididymis are normal. Rectal: Patient with  normal sphincter tone. Anus and perineum without scarring or rashes. No rectal masses are appreciated. Prostate is approximately 45 grams, irregular, no nodules are appreciated. Seminal vesicles are normal. Skin: No rashes, bruises or suspicious lesions. Lymph: No cervical or inguinal adenopathy. Neurologic: Grossly intact, no focal deficits, moving all 4 extremities. Psychiatric: Normal mood and affect.  Laboratory Data: PSA History  2.42 ng/mL on 10/25/2014  3.50 ng/mL on 12/23/2015  4.83 ng/mL on 12/24/2016  4.80 ng/mL on 01/08/2017 -free PSA 0.86  6.4  ng/mL on 07/29/2017  6.3 ng/mL on 10/29/2017  I reviewed the labs  Assessment & Plan:    1. Elevated PSA  - Current PSA is  6.3, stable   2. Irregular prostate exam  - we dicussed undergoing a biopsy at this time, monitoring more closely and bring him in at three months or keep his original schedule at 6 months - I explained that the rise in the PSA and the irregular exam may likely be the result of prostate cancer and that any treatment at this time would be geared toward palliative treatment vs curative - he will continue follow ups at 6 months intervals   3. BPH with LUTS  - IPSS score is 19/3, it is improved  - Continue conservative management, avoiding bladder irritants and timed voiding's  - Continue tamsulosin 0.4 mg bid and oxybutynin XL 10 mg  - RTC in 6 months for I PSS, exam and PSA   4. Erectile dysfunction  - SHIM score is 8   - Continue sildenafil 20 mg, 5 tablets at once two hours prior to intercourse on an empty stomach  - patient brought in his Erect Aid device and it appears that he is having trouble with the suction  - RTC in 6 months for repeat SHIM score  5. Urgency  - Myrbetriq and oxybutynin not effective  - offered PTNS  - explained the PTNS provides treatment by indirectly providing electrical stimulation to the nerves responsible for bladder and pelvic floor function - a needle electrode generates an adjustable electrical pulse that travels to the sacral plexus via the tibial nerve which is located in the ankle, among other functions, the sacral nerve plexus regulates bladder and pelvic floor function - treatment protocol requires once-a-week treatments for 12 weeks, 30 minutes per session and many patients begin to see improvements by the 6th treatment. Patients who respond to treatment may require occasional treatments (~ once every 3 weeks) to sustain improvements. PTNS is a low-risk procedure. The most common side-effects with PTNS treatment are temporary and minor, resulting from the placement of the needle electrode. They include minor bleeding, mild pain and skin inflammation and  patients have seen up to an 80% success rate with this form of treatment  - RTC for PTNS   Return for PTNS - need to check with his insurance.  These notes generated with voice recognition software. I apologize for typographical errors.  Zara Council, [facility] [address], [place], [place] [address] [phone]

## 2017-10-31 ENCOUNTER — Encounter: Payer: Self-pay | Admitting: Urology

## 2017-10-31 ENCOUNTER — Ambulatory Visit (INDEPENDENT_AMBULATORY_CARE_PROVIDER_SITE_OTHER): Payer: Medicare Other | Admitting: Urology

## 2017-10-31 VITALS — BP 132/74 | HR 83 | Ht 69.5 in | Wt 264.2 lb

## 2017-10-31 DIAGNOSIS — N529 Male erectile dysfunction, unspecified: Secondary | ICD-10-CM

## 2017-10-31 DIAGNOSIS — N138 Other obstructive and reflux uropathy: Secondary | ICD-10-CM | POA: Diagnosis not present

## 2017-10-31 DIAGNOSIS — R972 Elevated prostate specific antigen [PSA]: Secondary | ICD-10-CM

## 2017-10-31 DIAGNOSIS — N401 Enlarged prostate with lower urinary tract symptoms: Secondary | ICD-10-CM | POA: Diagnosis not present

## 2017-10-31 DIAGNOSIS — R3989 Other symptoms and signs involving the genitourinary system: Secondary | ICD-10-CM

## 2017-10-31 DIAGNOSIS — R3915 Urgency of urination: Secondary | ICD-10-CM | POA: Diagnosis not present

## 2017-10-31 DIAGNOSIS — I70209 Unspecified atherosclerosis of native arteries of extremities, unspecified extremity: Secondary | ICD-10-CM | POA: Diagnosis not present

## 2017-11-08 ENCOUNTER — Telehealth: Payer: Self-pay | Admitting: Urology

## 2017-11-08 NOTE — Telephone Encounter (Signed)
Would you check and see if Jimmy Greer insurance will cover PTNS?

## 2017-11-11 NOTE — Telephone Encounter (Signed)
Okay.  Let schedule Jimmy Greer for PTNS.

## 2017-11-11 NOTE — Telephone Encounter (Signed)
I was told that Medicare will cover PTNS by Ramona a while back and that is what he has.  Sharyn Lull

## 2017-11-12 ENCOUNTER — Telehealth: Payer: Self-pay | Admitting: Urology

## 2017-11-12 NOTE — Telephone Encounter (Signed)
I will call him to schedule  Sharyn Lull

## 2017-11-27 DIAGNOSIS — Z96653 Presence of artificial knee joint, bilateral: Secondary | ICD-10-CM | POA: Insufficient documentation

## 2017-11-27 NOTE — Progress Notes (Signed)
Chief Complaint: No chief complaint on file.    HPI: 76 yo WM who presents today to start 12 weekly treatments of PTNS.  This will be #1 of 12.    BPH with LU TS His IPSS score is 19, which is moderate lower urinary tract symptomatology. He is mixed with his quality life due to his LUTS.  His previous I PSS score was 27/2.  His previous PVR was 0 mL.  His major complaints todays are frequency, urgency, nocturia, incontinence, intermittency and a weak urinary stream.  He has had these symptoms for two years.  He denies any dysuria, hematuria or suprapubic pain.  He currently taking tamsulosin 0.4 mg bid and oxybutynin XL 10 mg daily.   He did not find the Myrbertriq effective.  He also denies any recent fevers, chills, nausea or vomiting.  He has a family history of PCa, with his brother being diagnosed with prostate cancer.          IPSS    Row Name 10/31/17 1100             International Prostate Symptom Score   How often have you had the sensation of not emptying your bladder?  More than half the time     How often have you had to urinate less than every two hours?  More than half the time     How often have you found you stopped and started again several times when you urinated?  More than half the time     How often have you found it difficult to postpone urination?  Not at All     How often have you had a weak urinary stream?  About half the time     How often have you had to strain to start urination?  Less than 1 in 5 times     How many times did you typically get up at night to urinate?  3 Times     Total IPSS Score  19           Quality of Life due to urinary symptoms   If you were to spend the rest of your life with your urinary condition just the way it is now how would you feel about that?  Mixed        Score:  1-7 Mild 8-19 Moderate 20-35 Severe  Contraindications present for PTNS      Pacemaker - NO      Implantable  defibrillator - NO      History of abnormal bleeding - NO      History of neuropathies or nerve damage - NO  Discussed with patient possible complications of procedure, such as discomfort, bleeding at insertion/stimulation site, procedure consent signed  Patient goals:     To reduce trips to the bathroom.   Reemphasized that most patient's will see benefit by the 8th week of treatment, but about 10 to 20% of patient may be late responders.  If they happen to be late responders, it is important to continue with the therapy beyond the 12 weekly treatments as many of those patient find benefit.  Today, he is complaining of frequency, urgency, nocturia, incontinence and intermittency.  He denies dysuria, gross hematuria and suprapubic pain.  He denies fevers, chills, nausea or vomiting.    Treatment #  Baseline   # 1   # 2   # 3   # 4   # 5   #  6  Overall improvement  Day time voids  6-10  6-10        Night time voids  0-4  0-4        Incontinence episodes  3  3        Urgency   Strong  Strong          PMH: Past Medical History:  Diagnosis Date  . Arthritis   . Bleeding disorder ([place])   . Diabetes mellitus without complication ([place])   . GERD (gastroesophageal reflux disease)   . Hypertension     Surgical History: Past Surgical History:  Procedure Laterality Date  . APPENDECTOMY    . CARPAL TUNNEL RELEASE    . COLONOSCOPY WITH PROPOFOL N/A 09/25/2017   Procedure: COLONOSCOPY WITH PROPOFOL;  Surgeon: Toledo, Benay Pike, MD;  Location: [facility] ENDOSCOPY;  Service: Endoscopy;  Laterality: N/A;  . ESOPHAGOGASTRODUODENOSCOPY (EGD) WITH PROPOFOL N/A 09/25/2017   Procedure: ESOPHAGOGASTRODUODENOSCOPY (EGD) WITH PROPOFOL;  Surgeon: Toledo, Benay Pike, MD;  Location: [facility] ENDOSCOPY;  Service: Endoscopy;  Laterality: N/A;  . GALLBLADDER SURGERY    . REPLACEMENT TOTAL KNEE Bilateral     Home Medications:  Allergies as of 11/28/2017   No Known Allergies     Medication List         Accurate as of 11/28/17 12:35 PM. Always use your most recent med list.          allopurinol 300 MG tablet Commonly known as:  ZYLOPRIM TAKE 1 TABLET ONE TIME DAILY   aspirin EC 81 MG tablet Take by mouth.   [facility] VITAMIN B12 1000 MCG tablet Generic drug:  cyanocobalamin Take by mouth.   gabapentin 600 MG tablet Commonly known as:  NEURONTIN TAKE 1 TABLET THREE TIMES DAILY   hydrochlorothiazide 12.5 MG capsule Commonly known as:  MICROZIDE Take 12.5 mg by mouth daily.   HYDROcodone-acetaminophen 5-325 MG tablet Commonly known as:  NORCO/VICODIN 1 po q6h prn   Inositol 650 MG Tabs Take 1 tablet by mouth 3 (three) times daily.   Melatonin 5 MG Caps Take by mouth.   meloxicam 7.5 MG tablet Commonly known as:  MOBIC Take 7.5 mg by mouth daily.   metFORMIN 1000 MG tablet Commonly known as:  GLUCOPHAGE Take 1,000 mg by mouth 2 (two) times daily with a meal.   MULTI-VITAMINS Tabs Take by mouth.   omeprazole 40 MG capsule Commonly known as:  PRILOSEC TAKE 1 CAPSULE TWICE DAILY   oxybutynin 10 MG 24 hr tablet Commonly known as:  DITROPAN-XL Take 1 tablet (10 mg total) by mouth at bedtime.   pioglitazone 15 MG tablet Commonly known as:  ACTOS TAKE 1 TABLET ONE TIME DAILY   sildenafil 20 MG tablet Commonly known as:  REVATIO Take 20 mg by mouth as needed.   simvastatin 40 MG tablet Commonly known as:  ZOCOR TAKE 1 TABLET EVERY NIGHT   tamsulosin 0.4 MG Caps capsule Commonly known as:  FLOMAX TAKE 1 CAPSULE TWICE DAILY   traMADol 50 MG tablet Commonly known as:  ULTRAM Take by mouth every 6 (six) hours as needed.   warfarin 5 MG tablet Commonly known as:  COUMADIN Take by mouth. 2 days per week   warfarin 3 MG tablet Commonly known as:  COUMADIN Take 3 mg by mouth. Five days per week       Allergies: No Known Allergies  Family History: Family History  Problem Relation Age of Onset  . Prostate cancer Brother   . Bladder  Cancer Neg Hx   .  Kidney cancer Neg Hx     Social History:  reports that he has quit smoking. he has never used smokeless tobacco. He reports that he drinks alcohol. He reports that he does not use drugs.  ROS: UROLOGY Frequent Urination?: Yes Hard to postpone urination?: Yes Burning/pain with urination?: Yes Get up at night to urinate?: Yes Leakage of urine?: Yes Urine stream starts and stops?: Yes Trouble starting stream?: No Do you have to strain to urinate?: No Blood in urine?: No Urinary tract infection?: No Sexually transmitted disease?: No Injury to kidneys or bladder?: No Painful intercourse?: No Weak stream?: No Erection problems?: Yes Penile pain?: No  Gastrointestinal Nausea?: No Vomiting?: No Indigestion/heartburn?: No Diarrhea?: No Constipation?: No  Constitutional Fever: No Night sweats?: No Fatigue?: No  Skin Skin rash/lesions?: Yes Itching?: No  Eyes Blurred vision?: No Double vision?: No  Ears/Nose/Throat Sore throat?: No Sinus problems?: No  Hematologic/Lymphatic Swollen glands?: No Easy bruising?: Yes  Cardiovascular Leg swelling?: No Chest pain?: No  Respiratory Cough?: No Shortness of breath?: No  Endocrine Excessive thirst?: No  Musculoskeletal Back pain?: Yes Joint pain?: Yes  Neurological Headaches?: No Dizziness?: No  Psychologic Depression?: No Anxiety?: No   Physical Exam: BP 128/72   Pulse 69   Ht 5' 9.5" (1.765 m)   Wt 264 lb 9.6 oz (120 kg)   BMI 38.51 kg/m   Constitutional: Well nourished. Alert and oriented, No acute distress. HEENT: [place] AT, moist mucus membranes. Trachea midline, no masses. Cardiovascular: No clubbing, cyanosis, or edema. Respiratory: Normal respiratory effort, no increased work of breathing. Skin: No rashes, bruises or suspicious lesions. Lymph: No cervical or inguinal adenopathy. Neurologic: Grossly intact, no focal deficits, moving all 4 extremities. Psychiatric: Normal mood and  affect.   PTNS treatment: The needle electrode was inserted into the lower, inner aspect of the patient's left leg. The surface electrode was placed on the inside arch of the foot on the treatment leg. The lead set was connected to the stimulator and the needle electrode clip was connected to the needle electrode. The stimulator that produces an adjustable electrical pulse that travels to the sacral nerve plexus via the tibial nerve was increased to 6 until the patient received a toe flex and sensory response.     Assessment & Plan:    1. Urgency  Treatment Plan:  The needle electrode was removed without difficulty to the patient.  Patient tolerated the procedure for 30 minutes.  He will return next week for # 2 out of 12 of their weekly PTNS treatment's    Return in about 1 week (around 12/05/2017) for # 2 PTNS.  These notes generated with voice recognition software. I apologize for typographical errors.  Zara Council, [facility] [address], [place], [place] [address] [phone]

## 2017-11-28 ENCOUNTER — Encounter: Payer: Self-pay | Admitting: Urology

## 2017-11-28 ENCOUNTER — Other Ambulatory Visit: Payer: Self-pay

## 2017-11-28 ENCOUNTER — Ambulatory Visit: Payer: Medicare Other | Admitting: Urology

## 2017-11-28 ENCOUNTER — Ambulatory Visit (INDEPENDENT_AMBULATORY_CARE_PROVIDER_SITE_OTHER): Payer: Medicare Other | Admitting: Urology

## 2017-11-28 VITALS — BP 128/72 | HR 69 | Ht 69.5 in | Wt 264.6 lb

## 2017-11-28 DIAGNOSIS — R3915 Urgency of urination: Secondary | ICD-10-CM | POA: Diagnosis not present

## 2017-11-28 DIAGNOSIS — I70209 Unspecified atherosclerosis of native arteries of extremities, unspecified extremity: Secondary | ICD-10-CM

## 2017-11-28 NOTE — Progress Notes (Signed)
PTNS  Session # 1  Health & Social Factors: no change Caffeine: 20oz Alcohol: occasionally Daytime voids #per day: 6-10 Night-time voids #per night: 0-4 Urgency: strong Incontinence Episodes #per day: 3 Ankle used: Left Treatment Setting: 6 Feeling/ Response: Toe Flex & Sensory Comments:   Preformed By: Rulon Sera, PA-C  Assistant: C. Corinna Capra, CMA  Follow Up: 1 week

## 2017-11-28 NOTE — Addendum Note (Signed)
Addended by: Leia Alf on: 11/28/2017 03:52 PM   Modules accepted: Orders

## 2017-12-04 NOTE — Progress Notes (Signed)
Chief Complaint: No chief complaint on file.    HPI: 76 yo WM who presents today to start 12 weekly treatments of PTNS.  This will be #2 of 12.    BPH with LU TS His IPSS score is 19, which is moderate lower urinary tract symptomatology. He is mixed with his quality life due to his LUTS.  His previous I PSS score was 27/2.  His previous PVR was 0 mL.  His major complaints todays are frequency, urgency, nocturia, incontinence, intermittency and a weak urinary stream.  He has had these symptoms for two years.  He denies any dysuria, hematuria or suprapubic pain.  He currently taking tamsulosin 0.4 mg bid and oxybutynin XL 10 mg daily.   He did not find the Myrbertriq effective.  He also denies any recent fevers, chills, nausea or vomiting.  He has a family history of PCa, with his brother being diagnosed with prostate cancer.          IPSS    Row Name 10/31/17 1100             International Prostate Symptom Score   How often have you had the sensation of not emptying your bladder?  More than half the time     How often have you had to urinate less than every two hours?  More than half the time     How often have you found you stopped and started again several times when you urinated?  More than half the time     How often have you found it difficult to postpone urination?  Not at All     How often have you had a weak urinary stream?  About half the time     How often have you had to strain to start urination?  Less than 1 in 5 times     How many times did you typically get up at night to urinate?  3 Times     Total IPSS Score  19           Quality of Life due to urinary symptoms   If you were to spend the rest of your life with your urinary condition just the way it is now how would you feel about that?  Mixed        Score:  1-7 Mild 8-19 Moderate 20-35 Severe  Contraindications present for PTNS      Pacemaker - NO      Implantable  defibrillator - NO      History of abnormal bleeding - NO      History of neuropathies or nerve damage - NO  Discussed with patient possible complications of procedure, such as discomfort, bleeding at insertion/stimulation site, procedure consent signed  Patient goals:     To reduce trips to the bathroom.   Reemphasized that most patient's will see benefit by the 8th week of treatment, but about 10 to 20% of patient may be late responders.  If they happen to be late responders, it is important to continue with the therapy beyond the 12 weekly treatments as many of those patient find benefit.  Today, he is complaining of frequency, urgency, nocturia, incontinence and intermittency.  He denies dysuria, gross hematuria and suprapubic pain.  He denies fevers, chills, nausea or vomiting.    Treatment #  Baseline   # 1   # 2   # 3   # 4   # 5   #  6  Overall improvement  Day time voids  6-10  6-10  5-7       Night time voids  0-4  0-4  1-3       Incontinence episodes  3  3  0-1       Urgency   Strong  Strong  Mild         PMH: Past Medical History:  Diagnosis Date  . Arthritis   . Bleeding disorder ([place])   . Diabetes mellitus without complication ([place])   . GERD (gastroesophageal reflux disease)   . Hypertension     Surgical History: Past Surgical History:  Procedure Laterality Date  . APPENDECTOMY    . CARPAL TUNNEL RELEASE    . COLONOSCOPY WITH PROPOFOL N/A 09/25/2017   Procedure: COLONOSCOPY WITH PROPOFOL;  Surgeon: Toledo, Benay Pike, MD;  Location: [facility] ENDOSCOPY;  Service: Endoscopy;  Laterality: N/A;  . ESOPHAGOGASTRODUODENOSCOPY (EGD) WITH PROPOFOL N/A 09/25/2017   Procedure: ESOPHAGOGASTRODUODENOSCOPY (EGD) WITH PROPOFOL;  Surgeon: Toledo, Benay Pike, MD;  Location: [facility] ENDOSCOPY;  Service: Endoscopy;  Laterality: N/A;  . GALLBLADDER SURGERY    . REPLACEMENT TOTAL KNEE Bilateral     Home Medications:  Allergies as of 12/05/2017   No Known Allergies     Medication List          Accurate as of 12/05/17  4:50 PM. Always use your most recent med list.          allopurinol 300 MG tablet Commonly known as:  ZYLOPRIM TAKE 1 TABLET ONE TIME DAILY   aspirin EC 81 MG tablet Take by mouth.   CVS VITAMIN B12 1000 MCG tablet Generic drug:  cyanocobalamin Take by mouth.   gabapentin 600 MG tablet Commonly known as:  NEURONTIN TAKE 1 TABLET THREE TIMES DAILY   hydrochlorothiazide 12.5 MG capsule Commonly known as:  MICROZIDE Take 12.5 mg by mouth daily.   HYDROcodone-acetaminophen 5-325 MG tablet Commonly known as:  NORCO/VICODIN 1 po q6h prn   Inositol 650 MG Tabs Take 1 tablet by mouth 3 (three) times daily.   Melatonin 5 MG Caps Take by mouth.   meloxicam 7.5 MG tablet Commonly known as:  MOBIC Take 7.5 mg by mouth daily.   metFORMIN 1000 MG tablet Commonly known as:  GLUCOPHAGE Take 1,000 mg by mouth 2 (two) times daily with a meal.   MULTI-VITAMINS Tabs Take by mouth.   omeprazole 40 MG capsule Commonly known as:  PRILOSEC TAKE 1 CAPSULE TWICE DAILY   oxybutynin 10 MG 24 hr tablet Commonly known as:  DITROPAN-XL Take 1 tablet (10 mg total) by mouth at bedtime.   pioglitazone 15 MG tablet Commonly known as:  ACTOS TAKE 1 TABLET ONE TIME DAILY   sildenafil 20 MG tablet Commonly known as:  REVATIO Take 20 mg by mouth as needed.   simvastatin 40 MG tablet Commonly known as:  ZOCOR TAKE 1 TABLET EVERY NIGHT   tamsulosin 0.4 MG Caps capsule Commonly known as:  FLOMAX TAKE 1 CAPSULE TWICE DAILY   traMADol 50 MG tablet Commonly known as:  ULTRAM Take by mouth every 6 (six) hours as needed.   warfarin 5 MG tablet Commonly known as:  COUMADIN Take by mouth. 2 days per week   warfarin 3 MG tablet Commonly known as:  COUMADIN Take 3 mg by mouth. Five days per week       Allergies: No Known Allergies  Family History: Family History  Problem Relation Age of Onset  . Prostate  cancer Brother   . Bladder Cancer Neg  Hx   . Kidney cancer Neg Hx     Social History:  reports that he has quit smoking. he has never used smokeless tobacco. He reports that he drinks alcohol. He reports that he does not use drugs.  ROS: UROLOGY Frequent Urination?: No Hard to postpone urination?: No Burning/pain with urination?: No Get up at night to urinate?: No Leakage of urine?: No Urine stream starts and stops?: No Trouble starting stream?: No Do you have to strain to urinate?: No Blood in urine?: No Urinary tract infection?: No Sexually transmitted disease?: No Injury to kidneys or bladder?: No Painful intercourse?: No Weak stream?: No Erection problems?: No Penile pain?: No  Gastrointestinal Nausea?: No Vomiting?: No Indigestion/heartburn?: No Diarrhea?: No Constipation?: No  Constitutional Fever: No Night sweats?: No Weight loss?: No Fatigue?: No  Skin Skin rash/lesions?: No Itching?: No  Eyes Blurred vision?: No Double vision?: No  Ears/Nose/Throat Sore throat?: No Sinus problems?: No  Hematologic/Lymphatic Swollen glands?: No Easy bruising?: No  Cardiovascular Leg swelling?: No Chest pain?: No  Respiratory Cough?: No Shortness of breath?: No  Endocrine Excessive thirst?: No  Musculoskeletal Back pain?: No Joint pain?: No  Neurological Headaches?: No Dizziness?: No  Psychologic Depression?: No Anxiety?: No   Physical Exam: BP 133/63   Pulse 69   Ht 5' 9.5" (1.765 m)   Wt 256 lb (116.1 kg)   BMI 37.26 kg/m   Constitutional: Well nourished. Alert and oriented, No acute distress. HEENT: [place] AT, moist mucus membranes. Trachea midline, no masses. Cardiovascular: No clubbing, cyanosis, or edema. Respiratory: Normal respiratory effort, no increased work of breathing. Skin: No rashes, bruises or suspicious lesions. Lymph: No cervical or inguinal adenopathy. Neurologic: Grossly intact, no focal deficits, moving all 4 extremities. Psychiatric: Normal mood and  affect.   PTNS treatment: The needle electrode was inserted into the lower, inner aspect of the patient's left leg. The surface electrode was placed on the inside arch of the foot on the treatment leg. The lead set was connected to the stimulator and the needle electrode clip was connected to the needle electrode. The stimulator that produces an adjustable electrical pulse that travels to the sacral nerve plexus via the tibial nerve was increased to 10 until the patient received a toe reflex and sensory response.     Assessment & Plan:    1. Urgency  Treatment Plan:  The needle electrode was removed without difficulty to the patient.  Patient tolerated the procedure for 30 minutes.  He will return next week for # 3 out of 12 of their weekly PTNS treatment's    Return in about 1 week (around 12/12/2017) for # 3 PTNS.  These notes generated with voice recognition software. I apologize for typographical errors.  Zara Council, [facility] [address], [place], [place] [address] [phone]

## 2017-12-05 ENCOUNTER — Ambulatory Visit (INDEPENDENT_AMBULATORY_CARE_PROVIDER_SITE_OTHER): Payer: Medicare Other | Admitting: Urology

## 2017-12-05 ENCOUNTER — Encounter: Payer: Self-pay | Admitting: Urology

## 2017-12-05 VITALS — BP 133/63 | HR 69 | Ht 69.5 in | Wt 256.0 lb

## 2017-12-05 DIAGNOSIS — R3915 Urgency of urination: Secondary | ICD-10-CM

## 2017-12-05 NOTE — Progress Notes (Signed)
PTNS  Session # 2  Health & Social Factors: no change Caffeine: 20oz Alcohol: occ. Daytime voids #per day: 5-7 Night-time voids #per night: 1-3 Urgency: mild Incontinence Episodes #per day: 0-1 Ankle used: Left Treatment Setting: 10 Feeling/ Response: both Comments: n/a  Preformed By: Rulon Sera, PA-C  Assistant: C. Corinna Capra, CMA

## 2017-12-12 ENCOUNTER — Ambulatory Visit (INDEPENDENT_AMBULATORY_CARE_PROVIDER_SITE_OTHER): Payer: Medicare Other | Admitting: Urology

## 2017-12-12 VITALS — BP 126/67 | HR 84 | Ht 69.0 in | Wt 260.0 lb

## 2017-12-12 DIAGNOSIS — R3915 Urgency of urination: Secondary | ICD-10-CM

## 2017-12-12 NOTE — Progress Notes (Signed)
PTNS  Session # 3  Health & Social Factors: same Caffeine: 2 Alcohol: 0 Daytime voids #per day: 5 Night-time voids #per night: -13 Urgency: mild Incontinence Episodes #per day: 0-1 Ankle used: left Treatment Setting: 11 Feeling/ Response: both Comments: patient using HCTZ- swelling noted in ankles today  Preformed By: Fonnie Jarvis, CMA  Assistant: na  Follow Up: 1 week

## 2017-12-18 NOTE — Progress Notes (Signed)
Chief Complaint:  Chief Complaint  Patient presents with  . Urinary Urgency    PTNS     HPI: 77 yo WM who presents today to start 12 weekly treatments of PTNS.  This will be #4 of 12.    BPH with LU TS His IPSS score is 19, which is moderate lower urinary tract symptomatology. He is mixed with his quality life due to his LUTS.  His previous I PSS score was 27/2.  His previous PVR was 0 mL.  His major complaints todays are frequency, urgency, nocturia, incontinence, intermittency and a weak urinary stream.  He has had these symptoms for two years.  He denies any dysuria, hematuria or suprapubic pain.  He currently taking tamsulosin 0.4 mg bid and oxybutynin XL 10 mg daily.   He did not find the Myrbertriq effective.  He also denies any recent fevers, chills, nausea or vomiting.  He has a family history of PCa, with his brother being diagnosed with prostate cancer.          IPSS    Row Name 10/31/17 1100             International Prostate Symptom Score   How often have you had the sensation of not emptying your bladder?  More than half the time     How often have you had to urinate less than every two hours?  More than half the time     How often have you found you stopped and started again several times when you urinated?  More than half the time     How often have you found it difficult to postpone urination?  Not at All     How often have you had a weak urinary stream?  About half the time     How often have you had to strain to start urination?  Less than 1 in 5 times     How many times did you typically get up at night to urinate?  3 Times     Total IPSS Score  19           Quality of Life due to urinary symptoms   If you were to spend the rest of your life with your urinary condition just the way it is now how would you feel about that?  Mixed        Score:  1-7 Mild 8-19 Moderate 20-35 Severe  Contraindications present for  PTNS      Pacemaker - NO      Implantable defibrillator - NO      History of abnormal bleeding - NO      History of neuropathies or nerve damage - NO  Discussed with patient possible complications of procedure, such as discomfort, bleeding at insertion/stimulation site, procedure consent signed  Patient goals:     To reduce trips to the bathroom.   Reemphasized that most patient's will see benefit by the 8th week of treatment, but about 10 to 20% of patient may be late responders.  If they happen to be late responders, it is important to continue with the therapy beyond the 12 weekly treatments as many of those patient find benefit.  Today, he is complaining of frequency, urgency, nocturia, incontinence and intermittency.  He denies dysuria, gross hematuria and suprapubic pain.  He denies fevers, chills, nausea or vomiting.    Treatment #  Baseline   # 1   # 2   # 3   #  4   # 5   # 6  Overall improvement  Day time voids  6-10  6-10  5-7  5  5      Night time voids  0-4  0-4  1-3  1-3  2     Incontinence episodes  3  3  0-1  0-1  0-2     Urgency   Strong  Strong  Mild  Mild  Mild       PMH: Past Medical History:  Diagnosis Date  . Arthritis   . Bleeding disorder ([place])   . Diabetes mellitus without complication ([place])   . GERD (gastroesophageal reflux disease)   . Hypertension     Surgical History: Past Surgical History:  Procedure Laterality Date  . APPENDECTOMY    . CARPAL TUNNEL RELEASE    . COLONOSCOPY WITH PROPOFOL N/A 09/25/2017   Procedure: COLONOSCOPY WITH PROPOFOL;  Surgeon: Toledo, Benay Pike, MD;  Location: [facility] ENDOSCOPY;  Service: Endoscopy;  Laterality: N/A;  . ESOPHAGOGASTRODUODENOSCOPY (EGD) WITH PROPOFOL N/A 09/25/2017   Procedure: ESOPHAGOGASTRODUODENOSCOPY (EGD) WITH PROPOFOL;  Surgeon: Toledo, Benay Pike, MD;  Location: [facility] ENDOSCOPY;  Service: Endoscopy;  Laterality: N/A;  . GALLBLADDER SURGERY    . REPLACEMENT TOTAL KNEE Bilateral     Home Medications:    Allergies as of 12/19/2017   No Known Allergies     Medication List        Accurate as of 12/19/17  3:19 PM. Always use your most recent med list.          allopurinol 300 MG tablet Commonly known as:  ZYLOPRIM TAKE 1 TABLET ONE TIME DAILY   aspirin EC 81 MG tablet Take by mouth.   [employer]S VITAMIN B12 1000 MCG tablet Generic drug:  cyanocobalamin Take by mouth.   gabapentin 600 MG tablet Commonly known as:  NEURONTIN TAKE 1 TABLET THREE TIMES DAILY   hydrochlorothiazide 12.5 MG capsule Commonly known as:  MICROZIDE Take 12.5 mg by mouth daily.   HYDROcodone-acetaminophen 5-325 MG tablet Commonly known as:  NORCO/VICODIN 1 po q6h prn   Inositol 650 MG Tabs Take 1 tablet by mouth 3 (three) times daily.   Melatonin 5 MG Caps Take by mouth.   meloxicam 7.5 MG tablet Commonly known as:  MOBIC Take 7.5 mg by mouth daily.   metFORMIN 1000 MG tablet Commonly known as:  GLUCOPHAGE Take 1,000 mg by mouth 2 (two) times daily with a meal.   MULTI-VITAMINS Tabs Take by mouth.   omeprazole 40 MG capsule Commonly known as:  PRILOSEC TAKE 1 CAPSULE TWICE DAILY   oxybutynin 10 MG 24 hr tablet Commonly known as:  DITROPAN-XL Take 1 tablet (10 mg total) by mouth at bedtime.   pioglitazone 15 MG tablet Commonly known as:  ACTOS TAKE 1 TABLET ONE TIME DAILY   sildenafil 20 MG tablet Commonly known as:  REVATIO Take 20 mg by mouth as needed.   simvastatin 40 MG tablet Commonly known as:  ZOCOR TAKE 1 TABLET EVERY NIGHT   tamsulosin 0.4 MG Caps capsule Commonly known as:  FLOMAX TAKE 1 CAPSULE TWICE DAILY   traMADol 50 MG tablet Commonly known as:  ULTRAM Take by mouth every 6 (six) hours as needed.   warfarin 5 MG tablet Commonly known as:  COUMADIN Take by mouth. 2 days per week   warfarin 3 MG tablet Commonly known as:  COUMADIN Take 3 mg by mouth. Five days per week       Allergies: No  Known Allergies  Family History: Family History  Problem  Relation Age of Onset  . Prostate cancer Brother   . Bladder Cancer Neg Hx   . Kidney cancer Neg Hx     Social History:  reports that he has quit smoking. he has never used smokeless tobacco. He reports that he drinks alcohol. He reports that he does not use drugs.  ROS: UROLOGY Frequent Urination?: No Hard to postpone urination?: No Burning/pain with urination?: No Get up at night to urinate?: No Leakage of urine?: No Urine stream starts and stops?: No Trouble starting stream?: No Do you have to strain to urinate?: No Blood in urine?: No Urinary tract infection?: No Sexually transmitted disease?: No Injury to kidneys or bladder?: No Painful intercourse?: No Weak stream?: No Erection problems?: No Penile pain?: No  Gastrointestinal Nausea?: No Vomiting?: No Indigestion/heartburn?: No Diarrhea?: No Constipation?: No  Constitutional Fever: No Night sweats?: No Weight loss?: No Fatigue?: No  Skin Skin rash/lesions?: No Itching?: No  Eyes Blurred vision?: No Double vision?: No  Ears/Nose/Throat Sore throat?: No Sinus problems?: No  Hematologic/Lymphatic Swollen glands?: No Easy bruising?: No  Cardiovascular Leg swelling?: No Chest pain?: No  Respiratory Cough?: No Shortness of breath?: No  Endocrine Excessive thirst?: No  Musculoskeletal Back pain?: No Joint pain?: No  Neurological Headaches?: No Dizziness?: No  Psychologic Depression?: No Anxiety?: No   Physical Exam: BP 105/63   Pulse 97   Ht 5\' 9"  (1.753 m)   Wt 260 lb (117.9 kg)   BMI 38.40 kg/m   Constitutional: Well nourished. Alert and oriented, No acute distress. HEENT: [place] AT, moist mucus membranes. Trachea midline, no masses. Cardiovascular: No clubbing, cyanosis, or edema. Respiratory: Normal respiratory effort, no increased work of breathing. Skin: No rashes, bruises or suspicious lesions. Lymph: No cervical or inguinal adenopathy. Neurologic: Grossly intact, no  focal deficits, moving all 4 extremities. Psychiatric: Normal mood and affect.   PTNS treatment: The needle electrode was inserted into the lower, inner aspect of the patient's left leg. The surface electrode was placed on the inside arch of the foot on the treatment leg. The lead set was connected to the stimulator and the needle electrode clip was connected to the needle electrode. The stimulator that produces an adjustable electrical pulse that travels to the sacral nerve plexus via the tibial nerve was increased to 14 until the patient received a sensory response.     Assessment & Plan:    1. Urgency  Treatment Plan:  The needle electrode was removed without difficulty to the patient.  Patient tolerated the procedure for 30 minutes.  He will return next week for # 5 out of 12 of their weekly PTNS treatment's  Return for # 5 PTNS.  These notes generated with voice recognition software. I apologize for typographical errors.  Zara Council, [facility] [address], [place], [place] [address] [phone]

## 2017-12-19 ENCOUNTER — Ambulatory Visit (INDEPENDENT_AMBULATORY_CARE_PROVIDER_SITE_OTHER): Payer: Medicare Other | Admitting: Urology

## 2017-12-19 ENCOUNTER — Encounter: Payer: Self-pay | Admitting: Urology

## 2017-12-19 VITALS — BP 105/63 | HR 97 | Ht 69.0 in | Wt 260.0 lb

## 2017-12-19 DIAGNOSIS — R3915 Urgency of urination: Secondary | ICD-10-CM | POA: Diagnosis not present

## 2017-12-19 NOTE — Progress Notes (Signed)
PTNS  Session # 4  Health & Social Factors: no change Caffeine: 2 Alcohol: 0 Daytime voids #per day: 5 Night-time voids #per night: 2 Urgency: mild Incontinence Episodes #per day: 0-2 Ankle used: left Treatment Setting: 14 Feeling/ Response: sensory Comments: none  Preformed By: Zara Council, PAC  Assistant: Fonnie Jarvis, CMA  Follow Up: 1 week

## 2017-12-25 NOTE — Progress Notes (Signed)
    Chief Complaint:  No chief complaint on file.    HPI: 77 yo WM who presents today to start 12 weekly treatments of PTNS.  This will be #5 of 12.    Contraindications present for PTNS      Pacemaker - NO      Implantable defibrillator - NO      History of abnormal bleeding - NO      History of neuropathies or nerve damage - NO  Discussed with patient possible complications of procedure, such as discomfort, bleeding at insertion/stimulation site, procedure consent signed  Patient goals:     To reduce trips to the bathroom.   Reemphasized that most patient's will see benefit by the 8th week of treatment, but about 10 to 20% of patient may be late responders.  If they happen to be late responders, it is important to continue with the therapy beyond the 12 weekly treatments as many of those patient find benefit.  Today, he is complaining of frequency, urgency, nocturia, incontinence and intermittency.  He denies dysuria, gross hematuria and suprapubic pain.  He denies fevers, chills, nausea or vomiting.    Treatment #  Baseline   # 1   # 2   # 3   # 4   # 5   # 6  Overall improvement  Day time voids  6-10  6-10  5-7  5  5   4-5    Night time voids  0-4  0-4  1-3  1-3  2  1     Incontinence episodes  3  3  0-1  0-1  0-2  0    Urgency   Strong  Strong  Mild  Mild  Mild  mild      PTNS treatment: The needle electrode was inserted into the lower, inner aspect of the patient's right leg. The surface electrode was placed on the inside arch of the foot on the treatment leg. The lead set was connected to the stimulator and the needle electrode clip was connected to the needle electrode. The stimulator that produces an adjustable electrical pulse that travels to the sacral nerve plexus via the tibial nerve was increased to 4 until the patient received a sensory response and a toe flex.     Assessment & Plan:    1. Urgency  Treatment Plan:  The needle electrode was removed without  difficulty to the patient.  Patient tolerated the procedure for 30 minutes.  He will return next week for # 6 out of 12 of their weekly PTNS treatment's  Return in about 1 week (around 01/02/2018) for # 6 PTNS.  These notes generated with voice recognition software. I apologize for typographical errors.  Zara Council, [facility] [address], [place], [place] [address] [phone]

## 2017-12-26 ENCOUNTER — Ambulatory Visit (INDEPENDENT_AMBULATORY_CARE_PROVIDER_SITE_OTHER): Payer: Medicare Other | Admitting: Urology

## 2017-12-26 DIAGNOSIS — R3915 Urgency of urination: Secondary | ICD-10-CM

## 2017-12-26 NOTE — Progress Notes (Signed)
PTNS  Session # 5  Health & Social Factors: No Change Caffeine: 2 cups/20oz Alcohol: 0 Daytime voids #per day: 4-5 Night-time voids #per night: 1 Urgency: Mild Incontinence Episodes #per day: 0 Ankle used: right Treatment Setting: 4 Feeling/ Response: both Comments:   Preformed By: Zara Council, PA  Assistant: Toniann Fail, LPN

## 2018-01-01 NOTE — Progress Notes (Signed)
    Chief Complaint:  Chief Complaint  Patient presents with  . PTNS     HPI: 77 yo WM who presents today to start 12 weekly treatments of PTNS.  This will be #6 of 12.    Contraindications present for PTNS      Pacemaker - NO      Implantable defibrillator - NO      History of abnormal bleeding - NO      History of neuropathies or nerve damage - NO  Discussed with patient possible complications of procedure, such as discomfort, bleeding at insertion/stimulation site, procedure consent signed  Patient goals:     To reduce trips to the bathroom.   Reemphasized that most patient's will see benefit by the 8th week of treatment, but about 10 to 20% of patient may be late responders.  If they happen to be late responders, it is important to continue with the therapy beyond the 12 weekly treatments as many of those patient find benefit.  Today, he is complaining of frequency, urgency, nocturia, incontinence and intermittency.  He denies dysuria, gross hematuria and suprapubic pain.  He denies fevers, chills, nausea or vomiting.    Treatment #  Baseline   # 1   # 2   # 3   # 4   # 5   # 6  Overall improvement  Day time voids  6-10  6-10  5-7  5  5   4-5  5   Night time voids  0-4  0-4  1-3  1-3  2  1   1.5   Incontinence episodes  3  3  0-1  0-1  0-2  0  0-1   Urgency   Strong  Strong  Mild  Mild  Mild  mild  mild     PTNS treatment: The needle electrode was inserted into the lower, inner aspect of the patient's left leg. The surface electrode was placed on the inside arch of the foot on the treatment leg. The lead set was connected to the stimulator and the needle electrode clip was connected to the needle electrode. The stimulator that produces an adjustable electrical pulse that travels to the sacral nerve plexus via the tibial nerve was increased to 19 until the patient received a toe flex.     Assessment & Plan:    1. Urgency  Treatment Plan:  The needle electrode was removed  without difficulty to the patient.  Patient tolerated the procedure for 30 minutes.  He will return next week for # 7 out of 12 of their weekly PTNS treatment's  Return for # 7 PTNS.  These notes generated with voice recognition software. I apologize for typographical errors.  Zara Council, [facility] [address], [place], [place] [address] [phone]

## 2018-01-02 ENCOUNTER — Ambulatory Visit (INDEPENDENT_AMBULATORY_CARE_PROVIDER_SITE_OTHER): Payer: Medicare Other | Admitting: Urology

## 2018-01-02 DIAGNOSIS — R3915 Urgency of urination: Secondary | ICD-10-CM | POA: Diagnosis not present

## 2018-01-02 NOTE — Progress Notes (Signed)
PTNS  Session # 6  Health & Social Factors: No change Caffeine: 20oz Alcohol: 0 Daytime voids #per day: 5 Night-time voids #per night: 1.5 Urgency: mild Incontinence Episodes #per day: 0-1 Ankle used: left Treatment Setting: 19 Feeling/ Response: toe flex Comments:   Preformed By: Zara Council, PA  Assistant: Toniann Fail, LPN

## 2018-01-06 ENCOUNTER — Encounter: Payer: Self-pay | Admitting: Urology

## 2018-01-09 ENCOUNTER — Ambulatory Visit (INDEPENDENT_AMBULATORY_CARE_PROVIDER_SITE_OTHER): Payer: Medicare Other

## 2018-01-09 DIAGNOSIS — R3915 Urgency of urination: Secondary | ICD-10-CM

## 2018-01-09 NOTE — Progress Notes (Signed)
PTNS  Session # 7  Health & Social Factors: CHANGE- Big Change Caffeine: 20oz Alcohol: 0 Daytime voids #per day: 4 Night-time voids #per night: 1 Urgency: none-mild Incontinence Episodes #per day: 0 Ankle used: right Treatment Setting: 10 Feeling/ Response: both Comments:   Preformed By: Toniann Fail, LPN    Follow Up: 1 week

## 2018-01-16 ENCOUNTER — Ambulatory Visit (INDEPENDENT_AMBULATORY_CARE_PROVIDER_SITE_OTHER): Payer: Medicare Other

## 2018-01-16 DIAGNOSIS — R3915 Urgency of urination: Secondary | ICD-10-CM

## 2018-01-16 NOTE — Progress Notes (Signed)
PTNS  Session # 8  Health & Social Factors: Change Caffeine: 2 Alcohol: 0 Daytime voids #per day: 4 Night-time voids #per night: 1 Urgency: mild Incontinence Episodes #per day: 1 Ankle used: left Treatment Setting: 19 Feeling/ Response: toe flex Comments:   Preformed By: Toniann Fail, LPN

## 2018-01-23 ENCOUNTER — Ambulatory Visit (INDEPENDENT_AMBULATORY_CARE_PROVIDER_SITE_OTHER): Payer: Medicare Other

## 2018-01-23 DIAGNOSIS — R3915 Urgency of urination: Secondary | ICD-10-CM

## 2018-01-23 NOTE — Progress Notes (Signed)
PTNS  Session # 9  Health & Social Factors: No change Caffeine: 2 Alcohol: 0 Daytime voids #per day: 5 Night-time voids #per night: 1 Urgency: mild Incontinence Episodes #per day: 1 Ankle used: right Treatment Setting: 15 Feeling/ Response: both Comments:   Preformed By: Toniann Fail, LPN

## 2018-01-30 ENCOUNTER — Ambulatory Visit (INDEPENDENT_AMBULATORY_CARE_PROVIDER_SITE_OTHER): Payer: Medicare Other

## 2018-01-30 DIAGNOSIS — R3915 Urgency of urination: Secondary | ICD-10-CM | POA: Diagnosis not present

## 2018-01-30 NOTE — Progress Notes (Signed)
PTNS  Session # 10  Health & Social Factors: no change Caffeine: 2 Alcohol: 0 Daytime voids #per day: 5 Night-time voids #per night: 1 Urgency: mild Incontinence Episodes #per day: 0 Ankle used: left Treatment Setting: 15 Feeling/ Response: both  Comments:   Preformed By: Toniann Fail, LPN

## 2018-02-06 ENCOUNTER — Ambulatory Visit (INDEPENDENT_AMBULATORY_CARE_PROVIDER_SITE_OTHER): Payer: Medicare Other

## 2018-02-06 DIAGNOSIS — R3915 Urgency of urination: Secondary | ICD-10-CM | POA: Diagnosis not present

## 2018-02-06 NOTE — Progress Notes (Signed)
PTNS  Session # 11  Health & Social Factors: CHANGE Caffeine: 2 Alcohol: 0 Daytime voids #per day: 4 Night-time voids #per night: 0-1 Urgency: mild Incontinence Episodes #per day: 0 Ankle used: right Treatment Setting: 19 Feeling/ Response: both Comments:   Preformed By: Toniann Fail, LPN    Follow Up: 1 week

## 2018-02-13 ENCOUNTER — Ambulatory Visit: Payer: Medicare Other

## 2018-02-17 ENCOUNTER — Ambulatory Visit (INDEPENDENT_AMBULATORY_CARE_PROVIDER_SITE_OTHER): Payer: Medicare Other

## 2018-02-17 DIAGNOSIS — R3915 Urgency of urination: Secondary | ICD-10-CM

## 2018-02-17 NOTE — Progress Notes (Signed)
PTNS  Session # 12  Health & Social Factors: no change Caffeine: 2 Alcohol: 0 Daytime voids #per day: 5 Night-time voids #per night: 2 Urgency: mild Incontinence Episodes #per day: 0-1 Ankle used: left Treatment Setting: 16 Feeling/ Response: both Comments: n/a  Preformed By: Fonnie Jarvis, CMA    Follow Up: 1 month with Larene Beach to discuss continuing treatment

## 2018-03-20 ENCOUNTER — Encounter: Payer: Self-pay | Admitting: Urology

## 2018-03-20 ENCOUNTER — Ambulatory Visit (INDEPENDENT_AMBULATORY_CARE_PROVIDER_SITE_OTHER): Payer: Medicare Other | Admitting: Urology

## 2018-03-20 VITALS — BP 124/61 | HR 80 | Ht 69.0 in | Wt 262.0 lb

## 2018-03-20 DIAGNOSIS — R3915 Urgency of urination: Secondary | ICD-10-CM | POA: Diagnosis not present

## 2018-03-20 DIAGNOSIS — R972 Elevated prostate specific antigen [PSA]: Secondary | ICD-10-CM

## 2018-03-20 DIAGNOSIS — R3989 Other symptoms and signs involving the genitourinary system: Secondary | ICD-10-CM | POA: Diagnosis not present

## 2018-03-20 NOTE — Progress Notes (Signed)
PTNS  Session # maint.  Health & Social Factors: change Caffeine: 2 Alcohol: 2 Daytime voids #per day: 5 Night-time voids #per night: 1 Urgency: mild Incontinence Episodes #per day: 0 Ankle used: left Treatment Setting: 19 Feeling/ Response: both Comments:   Preformed By: Toniann Fail, LPN

## 2018-04-03 ENCOUNTER — Telehealth: Payer: Self-pay | Admitting: Urology

## 2018-04-03 NOTE — Telephone Encounter (Signed)
Patient is due for his 6 months prostate exam and PSA.  We can incorporate this appointment into his May PTNS treatment.

## 2018-04-03 NOTE — Progress Notes (Signed)
03/20/2018 8:39 AM   [name] 10-Aug-1941 [id number]  Referring provider: Idelle Crouch, MD [facility] [place], [place] [address]  Chief Complaint  Patient presents with  . Follow-up    HPI: Patient is a 77 year old Caucasian male with an elevated PSA and BPH with LU TS, ED, irregular prostate exam and urgency who presents today after completing his 12 weekly treatments of PTNS.  Urgency He is experiencing frequency x 5 (improved), mild urgency (improved), nocturia x 1 (improved), incontinence x 0 (improved) and intermittency.   He did not reach goal with Myrbetriq or oxybutynin.  Patient denies any gross hematuria, dysuria or suprapubic/flank pain.  Patient denies any fevers, chills, nausea or vomiting.   He is quite distraught today as he lost his son suddenly due to a blood clot.    PMH: Past Medical History:  Diagnosis Date  . Arthritis   . Bleeding disorder ([place])   . Diabetes mellitus without complication ([place])   . GERD (gastroesophageal reflux disease)   . Hypertension     Surgical History: Past Surgical History:  Procedure Laterality Date  . APPENDECTOMY    . CARPAL TUNNEL RELEASE    . COLONOSCOPY WITH PROPOFOL N/A 09/25/2017   Procedure: COLONOSCOPY WITH PROPOFOL;  Surgeon: Toledo, Benay Pike, MD;  Location: [facility] ENDOSCOPY;  Service: Endoscopy;  Laterality: N/A;  . ESOPHAGOGASTRODUODENOSCOPY (EGD) WITH PROPOFOL N/A 09/25/2017   Procedure: ESOPHAGOGASTRODUODENOSCOPY (EGD) WITH PROPOFOL;  Surgeon: Toledo, Benay Pike, MD;  Location: [facility] ENDOSCOPY;  Service: Endoscopy;  Laterality: N/A;  . GALLBLADDER SURGERY    . REPLACEMENT TOTAL KNEE Bilateral     Home Medications:  Allergies as of 03/20/2018   No Known Allergies     Medication List        Accurate as of 03/20/18 11:59 PM. Always use your most recent med list.          allopurinol 300 MG tablet Commonly known as:  ZYLOPRIM TAKE 1 TABLET ONE TIME DAILY   aspirin  EC 81 MG tablet Take by mouth.   [employer]S VITAMIN B12 1000 MCG tablet Generic drug:  cyanocobalamin Take by mouth.   gabapentin 600 MG tablet Commonly known as:  NEURONTIN TAKE 1 TABLET THREE TIMES DAILY   hydrochlorothiazide 12.5 MG capsule Commonly known as:  MICROZIDE Take 12.5 mg by mouth daily.   HYDROcodone-acetaminophen 5-325 MG tablet Commonly known as:  NORCO/VICODIN 1 po q6h prn   Inositol 650 MG Tabs Take 1 tablet by mouth 3 (three) times daily.   Melatonin 5 MG Caps Take by mouth.   meloxicam 7.5 MG tablet Commonly known as:  MOBIC Take 7.5 mg by mouth daily.   metFORMIN 1000 MG tablet Commonly known as:  GLUCOPHAGE Take 1,000 mg by mouth 2 (two) times daily with a meal.   MULTI-VITAMINS Tabs Take by mouth.   omeprazole 40 MG capsule Commonly known as:  PRILOSEC TAKE 1 CAPSULE TWICE DAILY   oxybutynin 10 MG 24 hr tablet Commonly known as:  DITROPAN-XL Take 1 tablet (10 mg total) by mouth at bedtime.   pioglitazone 15 MG tablet Commonly known as:  ACTOS TAKE 1 TABLET ONE TIME DAILY   sildenafil 20 MG tablet Commonly known as:  REVATIO Take 20 mg by mouth as needed.   simvastatin 40 MG tablet Commonly known as:  ZOCOR TAKE 1 TABLET EVERY NIGHT   tamsulosin 0.4 MG Caps capsule Commonly known as:  FLOMAX TAKE 1 CAPSULE TWICE  DAILY   traMADol 50 MG tablet Commonly known as:  ULTRAM Take by mouth every 6 (six) hours as needed.   warfarin 5 MG tablet Commonly known as:  COUMADIN Take by mouth. 2 days per week   warfarin 3 MG tablet Commonly known as:  COUMADIN Take 3 mg by mouth. Five days per week       Allergies: No Known Allergies  Family History: Family History  Problem Relation Age of Onset  . Prostate cancer Brother   . Bladder Cancer Neg Hx   . Kidney cancer Neg Hx     Social History:  reports that he has quit smoking. He has never used smokeless tobacco. He reports that he drinks alcohol. He reports that he does not use  drugs.  ROS: UROLOGY Frequent Urination?: Yes Hard to postpone urination?: Yes Burning/pain with urination?: No Get up at night to urinate?: Yes Leakage of urine?: Yes Urine stream starts and stops?: Yes Trouble starting stream?: No Do you have to strain to urinate?: No Blood in urine?: No Urinary tract infection?: No Sexually transmitted disease?: No Injury to kidneys or bladder?: No Painful intercourse?: No Weak stream?: No Erection problems?: Yes Penile pain?: No  Gastrointestinal Nausea?: No Vomiting?: No Indigestion/heartburn?: No Diarrhea?: No Constipation?: No  Constitutional Fever: No Night sweats?: No Weight loss?: No Fatigue?: No  Skin Skin rash/lesions?: No Itching?: No  Eyes Blurred vision?: No Double vision?: No  Ears/Nose/Throat Sore throat?: No Sinus problems?: No  Hematologic/Lymphatic Swollen glands?: No Easy bruising?: No  Cardiovascular Leg swelling?: No Chest pain?: No  Respiratory Cough?: No Shortness of breath?: No  Endocrine Excessive thirst?: No  Musculoskeletal Back pain?: Yes Joint pain?: Yes  Neurological Headaches?: No Dizziness?: No  Psychologic Depression?: No Anxiety?: No  Physical Exam: BP 124/61   Pulse 80   Ht 5\' 9"  (1.753 m)   Wt 262 lb (118.8 kg)   BMI 38.69 kg/m   Constitutional: Well nourished. Alert and oriented, No acute distress. HEENT: [place] AT, moist mucus membranes. Trachea midline, no masses. Cardiovascular: No clubbing, cyanosis, or edema. Respiratory: Normal respiratory effort, no increased work of breathing. Skin: No rashes, bruises or suspicious lesions. Neurologic: Grossly intact, no focal deficits, moving all 4 extremities. Psychiatric: Normal mood and affect.  Laboratory Data: PSA History  2.42 ng/mL on 10/25/2014  3.50 ng/mL on 12/23/2015  4.83 ng/mL on 12/24/2016  4.80 ng/mL on 01/08/2017 -free PSA 0.86  6.4  ng/mL on 07/29/2017  6.3 ng/mL on 10/29/2017  I reviewed the  labs  Assessment & Plan:    1. Elevated PSA  - Current PSA is 6.3, stable   - RTC in 03/2018 for repeat PSA  2. Irregular prostate exam  - RTC in 03/2018 for exam  3. BPH with LUTS  - IPSS score is 19/3, it is improved  - Continue conservative management, avoiding bladder irritants and timed voiding's  - Continue tamsulosin 0.4 mg bid and oxybutynin XL 10 mg  - RTC in 6 months for I PSS, exam and PSA   4. Erectile dysfunction  - SHIM score is 8   - Continue sildenafil 20 mg, 5 tablets at once two hours prior to intercourse on an empty stomach  - patient brought in his Erect Aid device and it appears that he is having trouble with the suction  - RTC in 6 months for repeat SHIM score  5. Urgency  - Myrbetriq and oxybutynin not effective  - continue PTNS   - RTC  in one month for PTNS  Return in about 1 month (around 04/19/2018) for Maintenance PTNS.  These notes generated with voice recognition software. I apologize for typographical errors.  Zara Council, PA-C  [facility] [address] [place], [place] [address] [phone]

## 2018-04-21 ENCOUNTER — Ambulatory Visit (INDEPENDENT_AMBULATORY_CARE_PROVIDER_SITE_OTHER): Payer: Medicare Other

## 2018-04-21 DIAGNOSIS — R3915 Urgency of urination: Secondary | ICD-10-CM

## 2018-04-21 NOTE — Progress Notes (Signed)
PTNS  Session : Maintenance   Health & Social Factors: None Caffeine: 2 Alcohol: 2 Daytime voids #per day: 4-5 Night-time voids #per night: 1 Urgency: Mild Incontinence Episodes #per day: 0 Ankle used: Right Treatment Setting: 13 Feeling/ Response: Toe flex & Sensory Comments: N/A  Preformed By: Gordy Clement, CMA   Follow FX:[id number] appt for prostate exam and PSA

## 2018-04-29 ENCOUNTER — Telehealth: Payer: Self-pay | Admitting: Urology

## 2018-04-29 NOTE — Telephone Encounter (Signed)
Sorry no question, I was just letting you know that he has been scheduled for his follow up apps. Your last message to me stated that he was due and that he could just have it with his next PTNS app which was on your schedule, but we had to move that to the nurse schedule because you were out, so when he got scheduled it was just made as a separate app. Sorry for the confusion. He is all good.   Thanks,  Sharyn Lull

## 2018-04-29 NOTE — Telephone Encounter (Signed)
Have been made but they made them after his PTNS app not sure why? But he is scheduled for labs and his exam.   Sharyn Lull

## 2018-04-29 NOTE — Telephone Encounter (Signed)
I am not understanding the question.  May I have further clarification?

## 2018-05-20 ENCOUNTER — Ambulatory Visit: Payer: Medicare Other

## 2018-05-27 ENCOUNTER — Ambulatory Visit (INDEPENDENT_AMBULATORY_CARE_PROVIDER_SITE_OTHER): Payer: Medicare Other

## 2018-05-27 DIAGNOSIS — R3915 Urgency of urination: Secondary | ICD-10-CM

## 2018-05-27 NOTE — Progress Notes (Signed)
PTNS  Session # Monthly Maintenance   Health & Social Factors: No Change Caffeine: 2 Alcohol: 0 Daytime voids #per day: 4-5 Night-time voids #per night: 0-2 Urgency: Mild Incontinence Episodes #per day: Leakage Ankle used: left Treatment Setting: 19 Feeling/ Response: Sensory and toe flex Comments:   Preformed By: Cristie Hem, CMA  Assistant: Cristie Hem, CMA  Follow Up: As Scheduled

## 2018-06-03 ENCOUNTER — Other Ambulatory Visit: Payer: Self-pay | Admitting: Family Medicine

## 2018-06-03 DIAGNOSIS — R972 Elevated prostate specific antigen [PSA]: Secondary | ICD-10-CM

## 2018-06-04 ENCOUNTER — Other Ambulatory Visit: Payer: Medicare Other

## 2018-06-04 DIAGNOSIS — R972 Elevated prostate specific antigen [PSA]: Secondary | ICD-10-CM

## 2018-06-04 NOTE — Progress Notes (Signed)
06/05/2018 2:47 PM   Jimmy Greer 06-16-1941 [id number]  Referring provider: Idelle Crouch, MD [facility] [name], [name] [address]  Chief Complaint  Patient presents with  . Elevated PSA    HPI: Patient is a 77 year old Caucasian male with an elevated PSA and BPH with LU TS, ED and irregular prostate exam who presents today for a 6 month follow up.  Elevated PSA PSA Trend  2.42 in 10/2014  3.5 in 12/2015  4.8 in January 2018 with 23% probability of prostate cancer  6.4 in August 2018  6.3 in November 2018  8.6 in June 2019  BPH with LU TS His IPSS score today is 8, which is moderate lower urinary tract symptomatology.  He is mostly satisfied with his quality life due to his LUTS.  His previous I PSS score was 19/3.  His previous PVR was 0 mL.  His major complaints today's are frequency, nocturia, incontinence, intermittency and a weak urinary stream.  He has had these symptoms for two years.  He denies any dysuria, hematuria or suprapubic pain.  He currently taking tamsulosin 0.4 mg bid.  He is also undergoing PTNS therapy.  He also denies any recent fevers, chills, nausea or vomiting.  He has a family history of PCa, with his brother being diagnosed with prostate cancer.    IPSS    Row Name 06/05/18 1400         International Prostate Symptom Score   How often have you had the sensation of not emptying your bladder?  Less than 1 in 5     How often have you had to urinate less than every two hours?  Less than 1 in 5 times     How often have you found you stopped and started again several times when you urinated?  Less than half the time     How often have you found it difficult to postpone urination?  Less than 1 in 5 times     How often have you had a weak urinary stream?  Less than 1 in 5 times     How often have you had to strain to start urination?  Less than 1 in 5 times     How many times did you typically get up at night to  urinate?  1 Time     Total IPSS Score  8       Quality of Life due to urinary symptoms   If you were to spend the rest of your life with your urinary condition just the way it is now how would you feel about that?  Mostly Satisfied        Score:  1-7 Mild 8-19 Moderate 20-35 Severe  Erectile dysfunction His SHIM score is 5, which is severe ED.  His previous SHIM score was 7.  He has been having difficulty with erections for several years.  His major complaint is lack of firmness for penetration.  His libido is preserved.   His risk factors for ED are age, spinal injury, DM, HTN, HLD, sleep apnea and blood pressure medications.   He denies any painful erections or curvatures with his erections.   He has tried sildenafil in the past with mixed results.  He has a erect aid device.   SHIM    Row Name 06/05/18 1428         SHIM: Over the last 6 months:   How do  you rate your confidence that you could get and keep an erection?  Very Low     When you had erections with sexual stimulation, how often were your erections hard enough for penetration (entering your partner)?  Almost Never or Never     During sexual intercourse, how often were you able to maintain your erection after you had penetrated (entered) your partner?  Almost Never or Never     During sexual intercourse, how difficult was it to maintain your erection to completion of intercourse?  Extremely Difficult     When you attempted sexual intercourse, how often was it satisfactory for you?  Almost Never or Never       SHIM Total Score   SHIM  5        Score: 1-7 Severe ED 8-11 Moderate ED 12-16 Mild-Moderate ED 17-21 Mild ED 22-25 No ED    PMH: Past Medical History:  Diagnosis Date  . Arthritis   . Bleeding disorder ([place])   . Diabetes mellitus without complication ([place])   . GERD (gastroesophageal reflux disease)   . Hypertension     Surgical History: Past Surgical History:  Procedure Laterality Date  .  APPENDECTOMY    . CARPAL TUNNEL RELEASE    . COLONOSCOPY WITH PROPOFOL N/A 09/25/2017   Procedure: COLONOSCOPY WITH PROPOFOL;  Surgeon: Toledo, Benay Pike, MD;  Location: [facility] ENDOSCOPY;  Service: Endoscopy;  Laterality: N/A;  . ESOPHAGOGASTRODUODENOSCOPY (EGD) WITH PROPOFOL N/A 09/25/2017   Procedure: ESOPHAGOGASTRODUODENOSCOPY (EGD) WITH PROPOFOL;  Surgeon: Toledo, Benay Pike, MD;  Location: [facility] ENDOSCOPY;  Service: Endoscopy;  Laterality: N/A;  . GALLBLADDER SURGERY    . REPLACEMENT TOTAL KNEE Bilateral     Home Medications:  Allergies as of 06/05/2018   No Known Allergies     Medication List        Accurate as of 06/05/18  2:47 PM. Always use your most recent med list.          allopurinol 300 MG tablet Commonly known as:  ZYLOPRIM TAKE 1 TABLET ONE TIME DAILY   aspirin EC 81 MG tablet Take by mouth.   [employer] VITAMIN B12 1000 MCG tablet Generic drug:  cyanocobalamin Take by mouth.   gabapentin 600 MG tablet Commonly known as:  NEURONTIN TAKE 1 TABLET THREE TIMES DAILY   hydrochlorothiazide 12.5 MG capsule Commonly known as:  MICROZIDE Take 12.5 mg by mouth daily.   HYDROcodone-acetaminophen 5-325 MG tablet Commonly known as:  NORCO/VICODIN 1 po q6h prn   Inositol 650 MG Tabs Take 1 tablet by mouth 3 (three) times daily.   Melatonin 5 MG Caps Take by mouth.   meloxicam 7.5 MG tablet Commonly known as:  MOBIC Take 7.5 mg by mouth daily.   metFORMIN 1000 MG tablet Commonly known as:  GLUCOPHAGE Take 1,000 mg by mouth 2 (two) times daily with a meal.   MULTI-VITAMINS Tabs Take by mouth.   omeprazole 40 MG capsule Commonly known as:  PRILOSEC TAKE 1 CAPSULE TWICE DAILY   oxybutynin 10 MG 24 hr tablet Commonly known as:  DITROPAN-XL Take 1 tablet (10 mg total) by mouth at bedtime.   pioglitazone 15 MG tablet Commonly known as:  ACTOS TAKE 1 TABLET ONE TIME DAILY   sildenafil 20 MG tablet Commonly known as:  REVATIO Take 20 mg by mouth as needed.     simvastatin 40 MG tablet Commonly known as:  ZOCOR TAKE 1 TABLET EVERY NIGHT   tamsulosin 0.4 MG Caps capsule Commonly known  as:  FLOMAX TAKE 1 CAPSULE TWICE DAILY   traMADol 50 MG tablet Commonly known as:  ULTRAM Take by mouth every 6 (six) hours as needed.   warfarin 5 MG tablet Commonly known as:  COUMADIN Take by mouth. 2 days per week   warfarin 3 MG tablet Commonly known as:  COUMADIN Take 3 mg by mouth. Five days per week       Allergies: No Known Allergies  Family History: Family History  Problem Relation Age of Onset  . Prostate cancer Brother   . Bladder Cancer Neg Hx   . Kidney cancer Neg Hx     Social History:  reports that he has quit smoking. He has never used smokeless tobacco. He reports that he drinks alcohol. He reports that he does not use drugs.  ROS: UROLOGY Frequent Urination?: Yes Hard to postpone urination?: No Burning/pain with urination?: No Get up at night to urinate?: Yes Leakage of urine?: Yes Urine stream starts and stops?: Yes Trouble starting stream?: No Do you have to strain to urinate?: No Blood in urine?: No Urinary tract infection?: No Sexually transmitted disease?: No Injury to kidneys or bladder?: No Painful intercourse?: No Weak stream?: No Erection problems?: Yes Penile pain?: No  Gastrointestinal Nausea?: No Vomiting?: No Indigestion/heartburn?: No Diarrhea?: Yes Constipation?: No  Constitutional Fever: No Night sweats?: No Weight loss?: No Fatigue?: No  Skin Skin rash/lesions?: No Itching?: No  Eyes Blurred vision?: No Double vision?: No  Ears/Nose/Throat Sore throat?: No Sinus problems?: No  Hematologic/Lymphatic Swollen glands?: No Easy bruising?: Yes  Cardiovascular Leg swelling?: No Chest pain?: No  Respiratory Cough?: No Shortness of breath?: No  Endocrine Excessive thirst?: No  Musculoskeletal Back pain?: Yes Joint pain?: No  Neurological Headaches?: No Dizziness?:  No  Psychologic Depression?: No Anxiety?: No  Physical Exam: BP 109/63   Pulse 88   Ht 5\' 9"  (1.753 m)   Wt 261 lb (118.4 kg)   BMI 38.54 kg/m   Constitutional: Well nourished. Alert and oriented, No acute distress. HEENT: [place] AT, moist mucus membranes. Trachea midline, no masses. Cardiovascular: No clubbing, cyanosis, or edema. Respiratory: Normal respiratory effort, no increased work of breathing. GI: Abdomen is soft, non tender, non distended, no abdominal masses. Liver and spleen not palpable.  No hernias appreciated.  Stool sample for occult testing is not indicated.   GU: No CVA tenderness.  No bladder fullness or masses.  Patient with circumcised phallus.  Urethral meatus is patent.  No penile discharge. No penile lesions or rashes. Scrotum without lesions, cysts, rashes and/or edema.  Testicles are located scrotally bilaterally. No masses are appreciated in the testicles. Left and right epididymis are normal. Rectal: Patient with  normal sphincter tone. Anus and perineum without scarring or rashes. No rectal masses are appreciated. Prostate is approximately 45 grams, firm in the right lobe, 5 mm x 5 mm node in left apex.  Seminal vesicles are normal. Skin: No rashes, bruises or suspicious lesions. Lymph: No cervical or inguinal adenopathy. Neurologic: Grossly intact, no focal deficits, moving all 4 extremities. Psychiatric: Normal mood and affect.   Laboratory Data: See HPI  I reviewed the labs  Assessment & Plan:    1. Elevated PSA I discussed with the patient that PSA is an acronym for  prostate specific antigen,  which is a protein made by the prostate gland and can be detected in the blood stream. I explained to the patient situations that would increase the PSA, such as: a man's  age,  BPH, infection, recent intercourse/ejaculation, prostate infarction, recent urethroscopic manipulation (Foley placement/cystoscopy) and prostate cancer.  We discussed that indications  for prostate biopsy are defined by age and race specific PSA cutoffs as well as a PSA velocity of 0.75/year. I discussed the AUA Guideline's (2013) for men aged 40+ years or any man with less than a 10 to 15 year life expectancy that screening is not recommended.  If the individual is in excellent health and after discussion it is decided to do a screening PSA, the threshold for biopsy should be raised to 10 ng/mL - he does not want to wait until the PSA reaches ten  Patient will be schedule for a TRUSPBx of prostate.  The procedure is explained and the risks involved, such as blood in urine, blood in stool, blood in semen, infection, urinary retention, and on rare occasions sepsis and death.  Patient understands the risks as explained to him and he wishes to proceed.  Patient is on coumadin and ASA and will need clearance prior to stopping.   2. Irregular prostate exam See above   3. BPH with LUTS  - IPSS score is 8/2, it is improved  - Continue conservative management, avoiding bladder irritants and timed voiding's  - Continue tamsulosin 0.4 mg   - RTC pending biopsy results  4. Erectile dysfunction  - SHIM score is 5, it is worsening  - would like to discuss this more after biopsy  5. Urgency PTNS is effective   Return for TRUSPBx of the prostate; patient on coumadin and ASA.  These notes generated with voice recognition software. I apologize for typographical errors.  Zara Council, PA-C  [facility] [address] [place], [place] [address] [phone]

## 2018-06-05 ENCOUNTER — Encounter: Payer: Self-pay | Admitting: Urology

## 2018-06-05 ENCOUNTER — Ambulatory Visit (INDEPENDENT_AMBULATORY_CARE_PROVIDER_SITE_OTHER): Payer: Medicare Other | Admitting: Urology

## 2018-06-05 VITALS — BP 109/63 | HR 88 | Ht 69.0 in | Wt 261.0 lb

## 2018-06-05 DIAGNOSIS — N138 Other obstructive and reflux uropathy: Secondary | ICD-10-CM

## 2018-06-05 DIAGNOSIS — R972 Elevated prostate specific antigen [PSA]: Secondary | ICD-10-CM | POA: Diagnosis not present

## 2018-06-05 DIAGNOSIS — N529 Male erectile dysfunction, unspecified: Secondary | ICD-10-CM

## 2018-06-05 DIAGNOSIS — N401 Enlarged prostate with lower urinary tract symptoms: Secondary | ICD-10-CM | POA: Diagnosis not present

## 2018-06-05 DIAGNOSIS — R3989 Other symptoms and signs involving the genitourinary system: Secondary | ICD-10-CM

## 2018-06-05 DIAGNOSIS — R3915 Urgency of urination: Secondary | ICD-10-CM | POA: Diagnosis not present

## 2018-06-05 LAB — PSA: PROSTATE SPECIFIC AG, SERUM: 8.6 ng/mL — AB (ref 0.0–4.0)

## 2018-06-05 NOTE — Patient Instructions (Signed)
Transrectal Ultrasound-Guided Biopsy °A transrectal ultrasound-guided biopsy is a procedure to remove samples of tissue from your prostate using ultrasound images to guide the procedure. The procedure is usually done to evaluate the prostate gland of men who have an elevated prostate-specific antigen (PSA). PSA is a blood test to screen for prostate cancer. The biopsy samples are taken to check for prostate cancer. °Tell a health care provider about: °· Any allergies you have. °· All medicines you are taking, including vitamins, herbs, eye drops, creams, and over-the-counter medicines. °· Any problems you or family members have had with anesthetic medicines. °· Any blood disorders you have. °· Any surgeries you have had. °· Any medical conditions you have. °What are the risks? °Generally, this is a safe procedure. However, as with any procedure, problems can occur. Possible problems include: °· Infection of your prostate. °· Bleeding from your rectum or blood in your urine. °· Difficulty urinating. °· Nerve damage (this is usually temporary). °· Damage to surrounding structures such as blood vessels, organs, and muscles, which would require other procedures. ° °What happens before the procedure? °· Do not eat or drink anything after midnight on the night before the procedure or as directed by your health care provider. °· Take medicines only as directed by your health care provider. °· Your health care provider may have you stop taking certain medicines 5-7 days before the procedure. °· You will be given an enema before the procedure. During an enema, a liquid is injected into your rectum to clear out waste. °· You may have lab tests the day of your procedure. °· Plan to have someone take you home after the procedure. °What happens during the procedure? °· You will be given medicine to help you relax (sedative) before the procedure. An IV tube will be inserted into one of your veins and used to give fluids and  medicine. °· You will be given antibiotic medicine to reduce the risk of an infection. °· You will be placed on your side for the procedure. °· A probe with lubricated gel will be placed into your rectum, and images will be taken of your prostate and surrounding structures. °· Numbing medicine will be injected into the prostate before the biopsy samples are taken. °· A biopsy needle will then be inserted and guided to your prostate with the use of the ultrasound images. °· Samples of prostate tissue will be taken, and the needle will then be removed. °· The biopsy samples will be sent to a lab to be analyzed. Results are usually back in 2-3 days. °What happens after the procedure? °· You will be taken to a recovery area where you will be monitored. °· You may have some discomfort in the rectal area. You will be given pain medicines to control this. °· You may be allowed to go home the same day, or you may need to stay in the hospital overnight. °This information is not intended to replace advice given to you by your health care provider. Make sure you discuss any questions you have with your health care provider. °Document Released: 04/19/2014 Document Revised: 05/10/2016 Document Reviewed: 07/22/2013 °Elsevier Interactive Patient Education © 2018 [employer] ° °

## 2018-06-06 ENCOUNTER — Telehealth: Payer: Self-pay | Admitting: Urology

## 2018-06-06 NOTE — Telephone Encounter (Signed)
He can certainly have one performed, but it may serve just to muddy the waters.  If the 4 K score comes back low, we cannot be absolutely sure there is no prostate cancer.  It will only be helpful if it comes back high because then we know that there is a prostate cancer.

## 2018-06-06 NOTE — Telephone Encounter (Signed)
Called pt informed him of the information below, pt states that he will think about it and let us know what he decides on Monday.

## 2018-06-06 NOTE — Telephone Encounter (Signed)
Patient called and asked if you thought having a 4 K score should prior to having a biopsy would be wise to see if he really needed a biopsy or no?  Please advise   Thanks, Sharyn Lull

## 2018-06-16 ENCOUNTER — Telehealth: Payer: Self-pay

## 2018-06-16 NOTE — Telephone Encounter (Signed)
Left pt mess to call in regards to Clearance for stopping Plavix and ASA 7days prior to biopsy. A faxed clearance was received from Dr. Doy Hutching and is scanned into chart

## 2018-06-18 NOTE — Telephone Encounter (Signed)
Patient returned call and was advised about clearance.  He was advised to stop taking Plavix and ASA 7 days prior to biopsy.

## 2018-06-26 ENCOUNTER — Ambulatory Visit (INDEPENDENT_AMBULATORY_CARE_PROVIDER_SITE_OTHER): Payer: Medicare Other | Admitting: Family Medicine

## 2018-06-26 DIAGNOSIS — R3915 Urgency of urination: Secondary | ICD-10-CM

## 2018-06-26 NOTE — Addendum Note (Signed)
Addended by: Kyra Manges on: 06/26/2018 02:50 PM   Modules accepted: Level of Service

## 2018-06-26 NOTE — Progress Notes (Signed)
PTNS  Session # Maint.   Health & Social Factors: no charge Caffeine: 2 Alcohol: 1 Daytime voids #per day: 4-6 Night-time voids #per night: 1 Urgency: mild Incontinence Episodes #per day: 0 Ankle used: right Treatment Setting: 12 Feeling/ Response: Both Comments: Patient tolerated well.  Preformed By: Elberta Leatherwood, CMA  Follow Up: 1 month

## 2018-07-23 ENCOUNTER — Ambulatory Visit (INDEPENDENT_AMBULATORY_CARE_PROVIDER_SITE_OTHER): Payer: Medicare Other

## 2018-07-23 DIAGNOSIS — R3915 Urgency of urination: Secondary | ICD-10-CM | POA: Diagnosis not present

## 2018-07-23 NOTE — Progress Notes (Signed)
PTNS  Session # Monthly  Health & Social Factors: No Change Caffeine: 2 Alcohol: 0 Daytime voids #per day: 4-6 Night-time voids #per night: 1 Urgency: Mild Incontinence Episodes #per day: 0 Ankle used: Left Treatment Setting: 19 Feeling/ Response: Sensory Comments:   Preformed By: Cristie Hem, CMA    Follow Up: As Scheduled

## 2018-07-24 ENCOUNTER — Other Ambulatory Visit: Payer: Self-pay | Admitting: Urology

## 2018-07-24 ENCOUNTER — Ambulatory Visit (INDEPENDENT_AMBULATORY_CARE_PROVIDER_SITE_OTHER): Payer: Medicare Other | Admitting: Urology

## 2018-07-24 ENCOUNTER — Encounter: Payer: Self-pay | Admitting: Urology

## 2018-07-24 VITALS — BP 120/73 | HR 105 | Ht 69.0 in | Wt 260.2 lb

## 2018-07-24 DIAGNOSIS — R972 Elevated prostate specific antigen [PSA]: Secondary | ICD-10-CM

## 2018-07-24 MED ORDER — LEVOFLOXACIN 500 MG PO TABS
500.0000 mg | ORAL_TABLET | Freq: Once | ORAL | Status: AC
  Administered 2018-07-24: 500 mg via ORAL

## 2018-07-24 MED ORDER — GENTAMICIN SULFATE 40 MG/ML IJ SOLN
80.0000 mg | Freq: Once | INTRAMUSCULAR | Status: AC
  Administered 2018-07-24: 80 mg via INTRAMUSCULAR

## 2018-07-24 NOTE — Progress Notes (Signed)
Prostate Biopsy Procedure   See Shannon's note of 06/05/2018.  Slowly rising PSA and slight firmness right prostate on exam  Informed consent was obtained after discussing risks/benefits of the procedure.  A time out was performed to ensure correct patient identity.  Pre-Procedure: - Last PSA Level: 05/2018  8.6 - Gentamicin given prophylactically - Levaquin 500 mg administered PO -Transrectal Ultrasound performed revealing a 48 gm prostate -No significant hypoechoic or median lobe noted  Procedure: - Prostate block performed using 10 cc 1% lidocaine and biopsies taken from sextant areas, a total of 12 under ultrasound guidance.  Post-Procedure: - Patient tolerated the procedure well - He was counseled to seek immediate medical attention if experiences any severe pain, significant bleeding, or fevers - Return in one week to discuss biopsy results   John Giovanni, MD

## 2018-07-28 ENCOUNTER — Encounter: Payer: Self-pay | Admitting: Urology

## 2018-08-01 LAB — PATHOLOGY REPORT

## 2018-08-04 ENCOUNTER — Other Ambulatory Visit: Payer: Self-pay | Admitting: Urology

## 2018-08-06 ENCOUNTER — Ambulatory Visit (INDEPENDENT_AMBULATORY_CARE_PROVIDER_SITE_OTHER): Payer: Medicare Other | Admitting: Urology

## 2018-08-06 ENCOUNTER — Encounter: Payer: Self-pay | Admitting: Urology

## 2018-08-06 VITALS — BP 136/75 | HR 85 | Ht 69.0 in | Wt 261.9 lb

## 2018-08-06 DIAGNOSIS — C61 Malignant neoplasm of prostate: Secondary | ICD-10-CM

## 2018-08-06 NOTE — Progress Notes (Signed)
08/06/2018 4:16 PM   Jimmy Greer [birth year] [id number]  Referring provider: Idelle Crouch, MD [facility] [place], [place] [id number]  Chief Complaint  Patient presents with  . Results    HPI: 77 year old male presents for prostate biopsy follow-up.  Prostate biopsy was performed on 07/24/2018 for a PSA of 8.6 and slight firmness of the right prostate on DRE.  The prostate volume was measured at 48 g.  Standard 12 core biopsies were performed.  Pathology: 5/6 cores were positive in the left prostate for Gleason 4+4 adenocarcinoma involving from 20-100% of submitted tissue.  There were 2/6 cores positive for Gleason 3+3 adenocarcinoma in the right prostate.  Refer to the path report for details.   PMH: Past Medical History:  Diagnosis Date  . Arthritis   . Bleeding disorder ([place])   . Diabetes mellitus without complication ([place])   . GERD (gastroesophageal reflux disease)   . Hypertension     Surgical History: Past Surgical History:  Procedure Laterality Date  . APPENDECTOMY    . CARPAL TUNNEL RELEASE    . COLONOSCOPY WITH PROPOFOL N/A 09/25/2017   Procedure: COLONOSCOPY WITH PROPOFOL;  Surgeon: Toledo, Benay Pike, MD;  Location: [facility] ENDOSCOPY;  Service: Endoscopy;  Laterality: N/A;  . ESOPHAGOGASTRODUODENOSCOPY (EGD) WITH PROPOFOL N/A 09/25/2017   Procedure: ESOPHAGOGASTRODUODENOSCOPY (EGD) WITH PROPOFOL;  Surgeon: Toledo, Benay Pike, MD;  Location: [facility] ENDOSCOPY;  Service: Endoscopy;  Laterality: N/A;  . GALLBLADDER SURGERY    . REPLACEMENT TOTAL KNEE Bilateral     Home Medications:  Allergies as of 08/06/2018   No Known Allergies     Medication List        Accurate as of 08/06/18  4:16 PM. Always use your most recent med list.          allopurinol 300 MG tablet Commonly known as:  ZYLOPRIM TAKE 1 TABLET ONE TIME DAILY   aspirin EC 81 MG tablet Take by mouth.   [employer] VITAMIN B12 1000 MCG tablet Generic drug:   cyanocobalamin Take by mouth.   gabapentin 600 MG tablet Commonly known as:  NEURONTIN TAKE 1 TABLET THREE TIMES DAILY   hydrochlorothiazide 12.5 MG capsule Commonly known as:  MICROZIDE Take 12.5 mg by mouth daily.   HYDROcodone-acetaminophen 5-325 MG tablet Commonly known as:  NORCO/VICODIN 1 po q6h prn   Inositol 650 MG Tabs Take 1 tablet by mouth 3 (three) times daily.   Melatonin 5 MG Caps Take by mouth.   meloxicam 7.5 MG tablet Commonly known as:  MOBIC Take 7.5 mg by mouth daily.   metFORMIN 1000 MG tablet Commonly known as:  GLUCOPHAGE Take 1,000 mg by mouth 2 (two) times daily with a meal.   MULTI-VITAMINS Tabs Take by mouth.   omeprazole 40 MG capsule Commonly known as:  PRILOSEC TAKE 1 CAPSULE TWICE DAILY   oxybutynin 10 MG 24 hr tablet Commonly known as:  DITROPAN-XL Take 1 tablet (10 mg total) by mouth at bedtime.   pioglitazone 15 MG tablet Commonly known as:  ACTOS TAKE 1 TABLET ONE TIME DAILY   sildenafil 20 MG tablet Commonly known as:  REVATIO Take 20 mg by mouth as needed.   simvastatin 40 MG tablet Commonly known as:  ZOCOR TAKE 1 TABLET EVERY NIGHT   tamsulosin 0.4 MG Caps capsule Commonly known as:  FLOMAX TAKE 1 CAPSULE TWICE DAILY   traMADol 50 MG tablet Commonly known as:  ULTRAM Take by mouth every 6 (six) hours  as needed.   warfarin 5 MG tablet Commonly known as:  COUMADIN Take by mouth. 2 days per week   warfarin 3 MG tablet Commonly known as:  COUMADIN Take 3 mg by mouth. Five days per week       Allergies: No Known Allergies  Family History: Family History  Problem Relation Age of Onset  . Prostate cancer Brother   . Bladder Cancer Neg Hx   . Kidney cancer Neg Hx     Social History:  reports that he has quit smoking. He has never used smokeless tobacco. He reports that he drinks alcohol. He reports that he does not use drugs.  ROS: UROLOGY Frequent Urination?: Yes Hard to postpone urination?:  Yes Burning/pain with urination?: Yes Get up at night to urinate?: Yes Leakage of urine?: Yes Urine stream starts and stops?: Yes Trouble starting stream?: Yes Do you have to strain to urinate?: Yes Blood in urine?: No Urinary tract infection?: No Sexually transmitted disease?: No Injury to kidneys or bladder?: No Painful intercourse?: No Weak stream?: No Erection problems?: Yes Penile pain?: No  Gastrointestinal Nausea?: No Vomiting?: No Indigestion/heartburn?: No Diarrhea?: Yes Constipation?: No  Constitutional Fever: No Night sweats?: No Weight loss?: No Fatigue?: No  Skin Skin rash/lesions?: No Itching?: No  Eyes Blurred vision?: No Double vision?: No  Ears/Nose/Throat Sore throat?: No Sinus problems?: No  Hematologic/Lymphatic Swollen glands?: No Easy bruising?: No  Cardiovascular Leg swelling?: No Chest pain?: No  Respiratory Cough?: No Shortness of breath?: No  Endocrine Excessive thirst?: No  Musculoskeletal Back pain?: Yes Joint pain?: Yes  Neurological Headaches?: No Dizziness?: No  Psychologic Depression?: No Anxiety?: No  Physical Exam: BP 136/75 (BP Location: Left Arm, Patient Position: Sitting, Cuff Size: Large)   Pulse 85   Ht 5\' 9"  (1.753 m)   Wt 261 lb 14.4 oz (118.8 kg)   BMI 38.68 kg/m   Constitutional:  Alert and oriented, No acute distress.   Assessment & Plan:   77 year old male with very high risk prostate cancer.  There was mild firmness on the right on DRE however his most significant cancer is on the left.  The pathology report was discussed in detail.  Will schedule a CT and bone scan as part of his metastatic evaluation.  If no evidence of metastatic disease feel his best treatment would be radiation therapy plus ADT.  Radical prostatectomy was discussed however he does have a high risk of extracapsular disease.  A referral to radiation oncology was ordered.  Greater than 50% of this 15-minute visit was  spent counseling the patient.   Abbie Sons, [place] [address] [place], [place] [address] [phone]

## 2018-08-15 ENCOUNTER — Encounter: Payer: Self-pay | Admitting: Radiation Oncology

## 2018-08-15 ENCOUNTER — Ambulatory Visit
Admission: RE | Admit: 2018-08-15 | Discharge: 2018-08-15 | Disposition: A | Discharge status: Home / Self Care | Payer: Medicare Other | Source: Ambulatory Visit | Attending: Radiation Oncology | Admitting: Radiation Oncology

## 2018-08-15 ENCOUNTER — Other Ambulatory Visit: Payer: Self-pay

## 2018-08-15 DIAGNOSIS — C61 Malignant neoplasm of prostate: Secondary | ICD-10-CM | POA: Diagnosis present

## 2018-08-15 DIAGNOSIS — E119 Type 2 diabetes mellitus without complications: Secondary | ICD-10-CM | POA: Diagnosis not present

## 2018-08-15 DIAGNOSIS — Z87891 Personal history of nicotine dependence: Secondary | ICD-10-CM | POA: Insufficient documentation

## 2018-08-15 DIAGNOSIS — Z7984 Long term (current) use of oral hypoglycemic drugs: Secondary | ICD-10-CM | POA: Insufficient documentation

## 2018-08-15 DIAGNOSIS — Z7982 Long term (current) use of aspirin: Secondary | ICD-10-CM | POA: Diagnosis not present

## 2018-08-15 DIAGNOSIS — I10 Essential (primary) hypertension: Secondary | ICD-10-CM | POA: Diagnosis not present

## 2018-08-15 DIAGNOSIS — Z7901 Long term (current) use of anticoagulants: Secondary | ICD-10-CM | POA: Diagnosis not present

## 2018-08-15 DIAGNOSIS — Z79899 Other long term (current) drug therapy: Secondary | ICD-10-CM | POA: Diagnosis not present

## 2018-08-15 DIAGNOSIS — M129 Arthropathy, unspecified: Secondary | ICD-10-CM | POA: Diagnosis not present

## 2018-08-15 DIAGNOSIS — K219 Gastro-esophageal reflux disease without esophagitis: Secondary | ICD-10-CM | POA: Insufficient documentation

## 2018-08-15 DIAGNOSIS — R350 Frequency of micturition: Secondary | ICD-10-CM | POA: Diagnosis not present

## 2018-08-15 NOTE — Consult Note (Signed)
NEW PATIENT EVALUATION  Name: Jimmy Greer  MRN: [id number]  Date:   08/15/2018     DOB: 29-Dec-1940   This 77 y.o. male patient presents to the clinic for initial evaluation of tage IIB (T2 cN0 M0) Gleason 8 (4+4) adenocarcinoma the prostate presenting the PSA of 8.6.  REFERRING PHYSICIAN: Idelle Crouch, MD  CHIEF COMPLAINT:  Chief Complaint  Patient presents with  . Prostate Cancer    Initial Eval    DIAGNOSIS: The encounter diagnosis was Malignant neoplasm of prostate ([place]).   PREVIOUS INVESTIGATIONS:  Pathology report reviewed Bone scan and CT scan of abdomen and pelvis ordered will be reviewed Clinical notes reviewed  HPI: patient is a 77 year old male presented with a gradually rising PSA over the past few years most recently 8.6. He also developed some asymmetry and firmness of the left lateral lobe of his prostate on digital rectal exam. Prostate was measured at 48 g. He underwent transrectal ultrasound-guided biopsy showing 7 of 12 cores positive with 5 of 6 cores from the left lobe positive for Gleason 8 (4+4) adenocarcinoma. Bone scan and CT scan of abdomen and pelvis have been ordered. Patient does have a history of diarrhea fairly chronic. He also is receiving TINS treatment for urgency and frequency of urination. He is now referred to radiation oncology for opinion. He is having no bone pain.  PLANNED TREATMENT REGIMEN: I MRT radiation therapy to prostate and pelvic nodes image guided radiationplus ADT therapy  PAST MEDICAL HISTORY:  has a past medical history of Arthritis, Bleeding disorder ([place]), Diabetes mellitus without complication ([place]), GERD (gastroesophageal reflux disease), and Hypertension.    PAST SURGICAL HISTORY:  Past Surgical History:  Procedure Laterality Date  . APPENDECTOMY    . CARPAL TUNNEL RELEASE    . COLONOSCOPY WITH PROPOFOL N/A 09/25/2017   Procedure: COLONOSCOPY WITH PROPOFOL;  Surgeon: Toledo, Benay Pike, MD;  Location: [facility]  ENDOSCOPY;  Service: Endoscopy;  Laterality: N/A;  . ESOPHAGOGASTRODUODENOSCOPY (EGD) WITH PROPOFOL N/A 09/25/2017   Procedure: ESOPHAGOGASTRODUODENOSCOPY (EGD) WITH PROPOFOL;  Surgeon: Toledo, Benay Pike, MD;  Location: [facility] ENDOSCOPY;  Service: Endoscopy;  Laterality: N/A;  . GALLBLADDER SURGERY    . REPLACEMENT TOTAL KNEE Bilateral     FAMILY HISTORY: family history includes Prostate cancer in his brother.  SOCIAL HISTORY:  reports that he has quit smoking. He has never used smokeless tobacco. He reports that he drinks alcohol. He reports that he does not use drugs.  ALLERGIES: Patient has no known allergies.  MEDICATIONS:  Current Outpatient Medications  Medication Sig Dispense Refill  . allopurinol (ZYLOPRIM) 300 MG tablet TAKE 1 TABLET ONE TIME DAILY    . aspirin EC 81 MG tablet Take by mouth.    . cyanocobalamin ([employer] VITAMIN B12) 1000 MCG tablet Take by mouth.    . gabapentin (NEURONTIN) 600 MG tablet TAKE 1 TABLET THREE TIMES DAILY    . hydrochlorothiazide (MICROZIDE) 12.5 MG capsule Take 12.5 mg by mouth daily.    Marland Kitchen HYDROcodone-acetaminophen (NORCO/VICODIN) 5-325 MG tablet 1 po q6h prn    . Inositol 650 MG TABS Take 1 tablet by mouth 3 (three) times daily.    . Melatonin 5 MG CAPS Take by mouth.    . meloxicam (MOBIC) 7.5 MG tablet Take 7.5 mg by mouth daily.    . metFORMIN (GLUCOPHAGE) 1000 MG tablet Take 1,000 mg by mouth 2 (two) times daily with a meal.    . Multiple Vitamin (MULTI-VITAMINS) TABS Take by mouth.    Marland Kitchen  omeprazole (PRILOSEC) 40 MG capsule TAKE 1 CAPSULE TWICE DAILY    . oxybutynin (DITROPAN-XL) 10 MG 24 hr tablet Take 1 tablet (10 mg total) by mouth at bedtime. 30 tablet 11  . pioglitazone (ACTOS) 15 MG tablet TAKE 1 TABLET ONE TIME DAILY    . sildenafil (REVATIO) 20 MG tablet Take 20 mg by mouth as needed.    . simvastatin (ZOCOR) 40 MG tablet TAKE 1 TABLET EVERY NIGHT    . tamsulosin (FLOMAX) 0.4 MG CAPS capsule TAKE 1 CAPSULE TWICE DAILY    . traMADol  (ULTRAM) 50 MG tablet Take by mouth every 6 (six) hours as needed.    . warfarin (COUMADIN) 3 MG tablet Take 3 mg by mouth. Five days per week    . warfarin (COUMADIN) 5 MG tablet Take by mouth. 2 days per week     No current facility-administered medications for this encounter.     ECOG PERFORMANCE STATUS:  1 - Symptomatic but completely ambulatory  REVIEW OF SYSTEMS: patient does have anemia of unknown etiology.Well-developed well-nourished patient in NAD. HEENT reveals PERLA, EOMI, discs not visualized.  Oral cavity is clear. No oral mucosal lesions are identified. Neck is clear without evidence of cervical or supraclavicular adenopathy. Lungs are clear to A&P. Cardiac examination is essentially unremarkable with regular rate and rhythm without murmur rub or thrill. Abdomen is benign with no organomegaly or masses noted. Motor sensory and DTR levels are equal and symmetric in the upper and lower extremities. Cranial nerves II through XII are grossly intact. Proprioception is intact. No peripheral adenopathy or edema is identified. No motor or sensory levels are noted. Crude visual fields are within normal range.   PHYSICAL EXAM: BP (P) 118/85 (BP Location: Left Arm, Patient Position: Sitting)   Pulse (P) 94   Temp (P) 98.4 F (36.9 C) (Tympanic)   Wt (P) 257 lb 6.2 oz (116.7 kg)   BMI (P) 38.01 kg/m  On rectal exam rectal sphincter tone is good prostate is asymmetric with a dominant left lateral lobe which is firm sulcus is preserved bilaterally. No other rectal abnormalities identified..Well-developed well-nourished patient in NAD. HEENT reveals PERLA, EOMI, discs not visualized.  Oral cavity is clear. No oral mucosal lesions are identified. Neck is clear without evidence of cervical or supraclavicular adenopathy. Lungs are clear to A&P. Cardiac examination is essentially unremarkable with regular rate and rhythm without murmur rub or thrill. Abdomen is benign with no organomegaly or masses  noted. Motor sensory and DTR levels are equal and symmetric in the upper and lower extremities. Cranial nerves II through XII are grossly intact. Proprioception is intact. No peripheral adenopathy or edema is identified. No motor or sensory levels are noted. Crude visual fields are within normal range.   LABORATORY DATA: pathology reports reviewed    RADIOLOGY RESULTS:one scan and CT scan for complete staging have been ordered and will be reviewed   IMPRESSION: at least stage IIb high risk adenocarcinoma the prostate in 77 year old male  PLAN: I have run the [facility] nomogram which shows only a 33% chance of 5 year progression free probability after radical prostatectomy. He only has a 12% chance of organ confined disease as well as a 39% chance of lymph node involvement as well as seminal vesicle involvement. I have recommended I MRT radiation therapy to both prostate and pelvic nodes. Would treat his prostate 8000 cGy and his pelvic knows to 5400 cGy using dose painting technique of I am RT. I have asked  Dr. Bernardo Heater to place fiduciary markers in his prostate for daily image guided treatment. All of also asked Dr. Dene Gentry office to start Lupron therapy and would keep him suppressed for a year and a half to 2 years. Risks and benefits of radiation including exacerbation of his already present diarrhea increased lower urinary tract symptoms such as frequency urgency and nocturia fatigue alteration of blood counts skin reaction all were discussed in detail with the patient and his wife. They both seem to comprehend my treatment plan well. I will review his CT scan of abdomen and pelvis plus bone scan before priortreatment planning is performed.  I would like to take this opportunity to thank you for allowing me to participate in the care of your patient.Noreene Filbert, MD

## 2018-08-21 DIAGNOSIS — C61 Malignant neoplasm of prostate: Secondary | ICD-10-CM | POA: Insufficient documentation

## 2018-08-22 ENCOUNTER — Ambulatory Visit
Admission: RE | Admit: 2018-08-22 | Discharge: 2018-08-22 | Disposition: A | Discharge status: Home / Self Care | Payer: Medicare Other | Source: Ambulatory Visit | Attending: Urology | Admitting: Urology

## 2018-08-22 DIAGNOSIS — C61 Malignant neoplasm of prostate: Secondary | ICD-10-CM | POA: Diagnosis present

## 2018-08-22 HISTORY — DX: Malignant (primary) neoplasm, unspecified: C80.1

## 2018-08-22 MED ORDER — TECHNETIUM TC 99M MEDRONATE IV KIT
20.0000 | PACK | Freq: Once | INTRAVENOUS | Status: AC | PRN
  Administered 2018-08-22: 23.6 via INTRAVENOUS

## 2018-08-22 MED ORDER — IOPAMIDOL (ISOVUE-300) INJECTION 61%
100.0000 mL | Freq: Once | INTRAVENOUS | Status: AC | PRN
  Administered 2018-08-22: 100 mL via INTRAVENOUS

## 2018-08-26 ENCOUNTER — Telehealth: Payer: Self-pay | Admitting: Urology

## 2018-08-26 NOTE — Telephone Encounter (Signed)
Patient called and asked about his CT and bone scan results. Was wondering if you had had a chance to review them yet?   Sharyn Lull

## 2018-08-26 NOTE — Telephone Encounter (Signed)
I sent a result note to the clinical pool on 9/6.  His CT and bone scan showed no evidence of metastatic disease.

## 2018-08-26 NOTE — Telephone Encounter (Addendum)
Pt informed, he will resume with his scheduled appointments.

## 2018-08-28 ENCOUNTER — Ambulatory Visit (INDEPENDENT_AMBULATORY_CARE_PROVIDER_SITE_OTHER): Payer: Medicare Other

## 2018-08-28 DIAGNOSIS — R3915 Urgency of urination: Secondary | ICD-10-CM

## 2018-08-28 NOTE — Progress Notes (Signed)
PTNS  Session # monthly  Health & Social Factors: no change Caffeine: 2 Alcohol: 0 Daytime voids #per day: 4-6 Night-time voids #per night: 0-2 Urgency: mild Incontinence Episodes #per day: 0 Ankle used: right Treatment Setting: 16 Feeling/ Response: both Comments: none  Preformed By: Judson Roch watts, CMA   Follow Up: 1 month

## 2018-09-01 ENCOUNTER — Telehealth: Payer: Self-pay | Admitting: Urology

## 2018-09-01 NOTE — Telephone Encounter (Signed)
Pt was told by cancer ctr that he will be getting Lupron injection at his next month PTNS appt. Pt is confused and would like some clarity. He knows of his PTNS appt however doesn't know anything about the Lupron injection. Please advise pt [phone]

## 2018-09-10 ENCOUNTER — Ambulatory Visit (INDEPENDENT_AMBULATORY_CARE_PROVIDER_SITE_OTHER): Payer: Medicare Other | Admitting: Urology

## 2018-09-10 ENCOUNTER — Encounter: Payer: Self-pay | Admitting: Urology

## 2018-09-10 VITALS — BP 106/63 | HR 89 | Ht 69.0 in | Wt 258.2 lb

## 2018-09-10 DIAGNOSIS — C61 Malignant neoplasm of prostate: Secondary | ICD-10-CM | POA: Diagnosis not present

## 2018-09-10 MED ORDER — GENTAMICIN SULFATE 40 MG/ML IJ SOLN
80.0000 mg | Freq: Once | INTRAMUSCULAR | Status: AC
  Administered 2018-09-10: 80 mg via INTRAMUSCULAR

## 2018-09-10 MED ORDER — LEUPROLIDE ACETATE (6 MONTH) 45 MG IM KIT
45.0000 mg | PACK | Freq: Once | INTRAMUSCULAR | Status: AC
  Administered 2018-09-10: 45 mg via INTRAMUSCULAR

## 2018-09-10 NOTE — Progress Notes (Signed)
09/10/18  CC: gold fiducial markers  HPI: 77 y.o. male with prostate cancer who presents today for placement of fiducial markers in anticipation of his upcoming IMRT with Dr. Baruch Gouty.  Prostate Gold Seed Marker Placement Procedure   Informed consent was obtained after discussing risks/benefits of the procedure.  A time out was performed to ensure correct patient identity.  Pre-Procedure: - Gentamicin given prophylactically - PO Levaquin 500 mg also given today  Procedure: -Lidocaine jelly was administered per rectum -Rectal ultrasound probe was placed without difficulty and the prostate visualized - 3 fiducial gold seed markers placed, one at right base, one at left base, one at apex of prostate gland under transrectal ultrasound guidance  Post-Procedure: - Patient tolerated the procedure well - He was counseled to seek immediate medical attention if experiences any severe pain, significant bleeding, or fevers  John Giovanni, MD

## 2018-09-10 NOTE — Patient Instructions (Signed)
Leuprolide depot injection What is this medicine? LEUPROLIDE (loo PROE lide) is a man-made protein that acts like a natural hormone in the body. It decreases testosterone in men and decreases estrogen in women. In men, this medicine is used to treat advanced prostate cancer. In women, some forms of this medicine may be used to treat endometriosis, uterine fibroids, or other male hormone-related problems. This medicine may be used for other purposes; ask your health care provider or pharmacist if you have questions. COMMON BRAND NAME(S): Eligard, Lupron Depot, Lupron Depot-Ped, Viadur What should I tell my health care provider before I take this medicine? They need to know if you have any of these conditions: -diabetes -heart disease or previous heart attack -high blood pressure -high cholesterol -mental illness -osteoporosis -pain or difficulty passing urine -seizures -spinal cord metastasis -stroke -suicidal thoughts, plans, or attempt; a previous suicide attempt by you or a family member -tobacco smoker -unusual vaginal bleeding (women) -an unusual or allergic reaction to leuprolide, benzyl alcohol, other medicines, foods, dyes, or preservatives -pregnant or trying to get pregnant -breast-feeding How should I use this medicine? This medicine is for injection into a muscle or for injection under the skin. It is given by a health care professional in a hospital or clinic setting. The specific product will determine how it will be given to you. Make sure you understand which product you receive and how often you will receive it. Talk to your pediatrician regarding the use of this medicine in children. Special care may be needed. Overdosage: If you think you have taken too much of this medicine contact a poison control center or emergency room at once. NOTE: This medicine is only for you. Do not share this medicine with others. What if I miss a dose? It is important not to miss a dose.  Call your doctor or health care professional if you are unable to keep an appointment. Depot injections: Depot injections are given either once-monthly, every 12 weeks, every 16 weeks, or every 24 weeks depending on the product you are prescribed. The product you are prescribed will be based on if you are male or male, and your condition. Make sure you understand your product and dosing. What may interact with this medicine? Do not take this medicine with any of the following medications: -chasteberry This medicine may also interact with the following medications: -herbal or dietary supplements, like black cohosh or DHEA -male hormones, like estrogens or progestins and birth control pills, patches, rings, or injections -male hormones, like testosterone This list may not describe all possible interactions. Give your health care provider a list of all the medicines, herbs, non-prescription drugs, or dietary supplements you use. Also tell them if you smoke, drink alcohol, or use illegal drugs. Some items may interact with your medicine. What should I watch for while using this medicine? Visit your doctor or health care professional for regular checks on your progress. During the first weeks of treatment, your symptoms may get worse, but then will improve as you continue your treatment. You may get hot flashes, increased bone pain, increased difficulty passing urine, or an aggravation of nerve symptoms. Discuss these effects with your doctor or health care professional, some of them may improve with continued use of this medicine. Male patients may experience a menstrual cycle or spotting during the first months of therapy with this medicine. If this continues, contact your doctor or health care professional. What side effects may I notice from receiving this medicine? Side   effects that you should report to your doctor or health care professional as soon as possible: -allergic reactions like skin  rash, itching or hives, swelling of the face, lips, or tongue -breathing problems -chest pain -depression or memory disorders -pain in your legs or groin -pain at site where injected or implanted -seizures -severe headache -swelling of the feet and legs -suicidal thoughts or other mood changes -visual changes -vomiting Side effects that usually do not require medical attention (report to your doctor or health care professional if they continue or are bothersome): -breast swelling or tenderness -decrease in sex drive or performance -diarrhea -hot flashes -loss of appetite -muscle, joint, or bone pains -nausea -redness or irritation at site where injected or implanted -skin problems or acne This list may not describe all possible side effects. Call your doctor for medical advice about side effects. You may report side effects to FDA at 1-800-FDA-1088. Where should I keep my medicine? This drug is given in a hospital or clinic and will not be stored at home. NOTE: This sheet is a summary. It may not cover all possible information. If you have questions about this medicine, talk to your doctor, pharmacist, or health care provider.  2018 Elsevier/Gold Standard (2016-05-17 09:45:53)  

## 2018-09-11 ENCOUNTER — Telehealth: Payer: Self-pay | Admitting: Family Medicine

## 2018-09-11 LAB — PSA: PROSTATE SPECIFIC AG, SERUM: 8.8 ng/mL — AB (ref 0.0–4.0)

## 2018-09-11 NOTE — Telephone Encounter (Signed)
Patient notified

## 2018-09-11 NOTE — Telephone Encounter (Signed)
-----   Message from Abbie Sons, MD sent at 09/11/2018  7:53 AM EDT ----- PSA stable 8.8

## 2018-09-16 ENCOUNTER — Ambulatory Visit
Admission: RE | Admit: 2018-09-16 | Discharge: 2018-09-16 | Disposition: A | Discharge status: Home / Self Care | Payer: Medicare Other | Source: Ambulatory Visit | Attending: Radiation Oncology | Admitting: Radiation Oncology

## 2018-09-16 DIAGNOSIS — Z510 Encounter for antineoplastic radiation therapy: Secondary | ICD-10-CM | POA: Diagnosis not present

## 2018-09-16 DIAGNOSIS — C61 Malignant neoplasm of prostate: Secondary | ICD-10-CM | POA: Diagnosis not present

## 2018-09-17 DIAGNOSIS — Z510 Encounter for antineoplastic radiation therapy: Secondary | ICD-10-CM | POA: Diagnosis not present

## 2018-09-24 ENCOUNTER — Ambulatory Visit
Admission: RE | Admit: 2018-09-24 | Discharge: 2018-09-24 | Disposition: A | Discharge status: Home / Self Care | Payer: Medicare Other | Source: Ambulatory Visit | Attending: Radiation Oncology | Admitting: Radiation Oncology

## 2018-09-25 ENCOUNTER — Ambulatory Visit
Admission: RE | Admit: 2018-09-25 | Discharge: 2018-09-25 | Disposition: A | Discharge status: Home / Self Care | Payer: Medicare Other | Source: Ambulatory Visit | Attending: Radiation Oncology | Admitting: Radiation Oncology

## 2018-09-25 DIAGNOSIS — Z510 Encounter for antineoplastic radiation therapy: Secondary | ICD-10-CM | POA: Diagnosis not present

## 2018-09-26 ENCOUNTER — Ambulatory Visit
Admission: RE | Admit: 2018-09-26 | Discharge: 2018-09-26 | Disposition: A | Discharge status: Home / Self Care | Payer: Medicare Other | Source: Ambulatory Visit | Attending: Radiation Oncology | Admitting: Radiation Oncology

## 2018-09-26 DIAGNOSIS — Z510 Encounter for antineoplastic radiation therapy: Secondary | ICD-10-CM | POA: Diagnosis not present

## 2018-09-29 ENCOUNTER — Ambulatory Visit
Admission: RE | Admit: 2018-09-29 | Discharge: 2018-09-29 | Disposition: A | Discharge status: Home / Self Care | Payer: Medicare Other | Source: Ambulatory Visit | Attending: Radiation Oncology | Admitting: Radiation Oncology

## 2018-09-29 DIAGNOSIS — Z510 Encounter for antineoplastic radiation therapy: Secondary | ICD-10-CM | POA: Diagnosis not present

## 2018-09-30 ENCOUNTER — Ambulatory Visit
Admission: RE | Admit: 2018-09-30 | Discharge: 2018-09-30 | Disposition: A | Discharge status: Home / Self Care | Payer: Medicare Other | Source: Ambulatory Visit | Attending: Radiation Oncology | Admitting: Radiation Oncology

## 2018-09-30 DIAGNOSIS — Z510 Encounter for antineoplastic radiation therapy: Secondary | ICD-10-CM | POA: Diagnosis not present

## 2018-10-01 ENCOUNTER — Ambulatory Visit
Admission: RE | Admit: 2018-10-01 | Discharge: 2018-10-01 | Disposition: A | Discharge status: Home / Self Care | Payer: Medicare Other | Source: Ambulatory Visit | Attending: Radiation Oncology | Admitting: Radiation Oncology

## 2018-10-01 DIAGNOSIS — Z510 Encounter for antineoplastic radiation therapy: Secondary | ICD-10-CM | POA: Diagnosis not present

## 2018-10-02 ENCOUNTER — Ambulatory Visit: Payer: Medicare Other | Admitting: Family Medicine

## 2018-10-02 ENCOUNTER — Ambulatory Visit
Admission: RE | Admit: 2018-10-02 | Discharge: 2018-10-02 | Disposition: A | Discharge status: Home / Self Care | Payer: Medicare Other | Source: Ambulatory Visit | Attending: Radiation Oncology | Admitting: Radiation Oncology

## 2018-10-02 DIAGNOSIS — Z510 Encounter for antineoplastic radiation therapy: Secondary | ICD-10-CM | POA: Diagnosis not present

## 2018-10-02 DIAGNOSIS — R3915 Urgency of urination: Secondary | ICD-10-CM

## 2018-10-02 NOTE — Progress Notes (Signed)
PTNS  Session # Monthly  Health & Social Factors: no change Caffeine: 2 Alcohol: 0 Daytime voids #per day: Patient is having radiation treatments and is having frequent urination. Night-time voids #per night:  Urgency: mild Incontinence Episodes #per day: 0 Ankle used: left Treatment Setting: 14 Feeling/ Response: sensory Comments: Patient tolerated well  Preformed By: Elberta Leatherwood, CMA  Follow Up: 1 month

## 2018-10-03 ENCOUNTER — Ambulatory Visit
Admission: RE | Admit: 2018-10-03 | Discharge: 2018-10-03 | Disposition: A | Discharge status: Home / Self Care | Payer: Medicare Other | Source: Ambulatory Visit | Attending: Radiation Oncology | Admitting: Radiation Oncology

## 2018-10-03 DIAGNOSIS — Z510 Encounter for antineoplastic radiation therapy: Secondary | ICD-10-CM | POA: Diagnosis not present

## 2018-10-06 ENCOUNTER — Ambulatory Visit
Admission: RE | Admit: 2018-10-06 | Discharge: 2018-10-06 | Disposition: A | Discharge status: Home / Self Care | Payer: Medicare Other | Source: Ambulatory Visit | Attending: Radiation Oncology | Admitting: Radiation Oncology

## 2018-10-06 ENCOUNTER — Other Ambulatory Visit: Payer: Self-pay | Admitting: *Deleted

## 2018-10-06 DIAGNOSIS — Z510 Encounter for antineoplastic radiation therapy: Secondary | ICD-10-CM | POA: Diagnosis not present

## 2018-10-06 MED ORDER — MIRABEGRON ER 25 MG PO TB24: 25.0000 mg | ORAL_TABLET | Freq: Every day | ORAL | 5 refills | Status: DC

## 2018-10-07 ENCOUNTER — Ambulatory Visit
Admission: RE | Admit: 2018-10-07 | Discharge: 2018-10-07 | Disposition: A | Discharge status: Home / Self Care | Payer: Medicare Other | Source: Ambulatory Visit | Attending: Radiation Oncology | Admitting: Radiation Oncology

## 2018-10-07 DIAGNOSIS — Z510 Encounter for antineoplastic radiation therapy: Secondary | ICD-10-CM | POA: Diagnosis not present

## 2018-10-08 ENCOUNTER — Ambulatory Visit
Admission: RE | Admit: 2018-10-08 | Discharge: 2018-10-08 | Disposition: A | Discharge status: Home / Self Care | Payer: Medicare Other | Source: Ambulatory Visit | Attending: Radiation Oncology | Admitting: Radiation Oncology

## 2018-10-08 DIAGNOSIS — Z510 Encounter for antineoplastic radiation therapy: Secondary | ICD-10-CM | POA: Diagnosis not present

## 2018-10-09 ENCOUNTER — Ambulatory Visit
Admission: RE | Admit: 2018-10-09 | Discharge: 2018-10-09 | Disposition: A | Discharge status: Home / Self Care | Payer: Medicare Other | Source: Ambulatory Visit | Attending: Radiation Oncology | Admitting: Radiation Oncology

## 2018-10-09 DIAGNOSIS — Z510 Encounter for antineoplastic radiation therapy: Secondary | ICD-10-CM | POA: Diagnosis not present

## 2018-10-10 ENCOUNTER — Ambulatory Visit
Admission: RE | Admit: 2018-10-10 | Discharge: 2018-10-10 | Disposition: A | Discharge status: Home / Self Care | Payer: Medicare Other | Source: Ambulatory Visit | Attending: Radiation Oncology | Admitting: Radiation Oncology

## 2018-10-10 DIAGNOSIS — Z510 Encounter for antineoplastic radiation therapy: Secondary | ICD-10-CM | POA: Diagnosis not present

## 2018-10-13 ENCOUNTER — Ambulatory Visit
Admission: RE | Admit: 2018-10-13 | Discharge: 2018-10-13 | Disposition: A | Discharge status: Home / Self Care | Payer: Medicare Other | Source: Ambulatory Visit | Attending: Radiation Oncology | Admitting: Radiation Oncology

## 2018-10-13 DIAGNOSIS — Z510 Encounter for antineoplastic radiation therapy: Secondary | ICD-10-CM | POA: Diagnosis not present

## 2018-10-14 ENCOUNTER — Ambulatory Visit
Admission: RE | Admit: 2018-10-14 | Discharge: 2018-10-14 | Disposition: A | Discharge status: Home / Self Care | Payer: Medicare Other | Source: Ambulatory Visit | Attending: Radiation Oncology | Admitting: Radiation Oncology

## 2018-10-14 DIAGNOSIS — Z510 Encounter for antineoplastic radiation therapy: Secondary | ICD-10-CM | POA: Diagnosis not present

## 2018-10-15 ENCOUNTER — Ambulatory Visit
Admission: RE | Admit: 2018-10-15 | Discharge: 2018-10-15 | Disposition: A | Discharge status: Home / Self Care | Payer: Medicare Other | Source: Ambulatory Visit | Attending: Radiation Oncology | Admitting: Radiation Oncology

## 2018-10-15 DIAGNOSIS — Z510 Encounter for antineoplastic radiation therapy: Secondary | ICD-10-CM | POA: Diagnosis not present

## 2018-10-16 ENCOUNTER — Ambulatory Visit
Admission: RE | Admit: 2018-10-16 | Discharge: 2018-10-16 | Disposition: A | Discharge status: Home / Self Care | Payer: Medicare Other | Source: Ambulatory Visit | Attending: Radiation Oncology | Admitting: Radiation Oncology

## 2018-10-16 DIAGNOSIS — Z510 Encounter for antineoplastic radiation therapy: Secondary | ICD-10-CM | POA: Diagnosis not present

## 2018-10-17 ENCOUNTER — Ambulatory Visit
Admission: RE | Admit: 2018-10-17 | Discharge: 2018-10-17 | Disposition: A | Discharge status: Home / Self Care | Payer: Medicare Other | Source: Ambulatory Visit | Attending: Radiation Oncology | Admitting: Radiation Oncology

## 2018-10-17 DIAGNOSIS — Z510 Encounter for antineoplastic radiation therapy: Secondary | ICD-10-CM | POA: Diagnosis present

## 2018-10-17 DIAGNOSIS — C61 Malignant neoplasm of prostate: Secondary | ICD-10-CM | POA: Insufficient documentation

## 2018-10-20 ENCOUNTER — Ambulatory Visit
Admission: RE | Admit: 2018-10-20 | Discharge: 2018-10-20 | Disposition: A | Discharge status: Home / Self Care | Payer: Medicare Other | Source: Ambulatory Visit | Attending: Radiation Oncology | Admitting: Radiation Oncology

## 2018-10-20 DIAGNOSIS — Z510 Encounter for antineoplastic radiation therapy: Secondary | ICD-10-CM | POA: Diagnosis not present

## 2018-10-21 ENCOUNTER — Ambulatory Visit
Admission: RE | Admit: 2018-10-21 | Discharge: 2018-10-21 | Disposition: A | Discharge status: Home / Self Care | Payer: Medicare Other | Source: Ambulatory Visit | Attending: Radiation Oncology | Admitting: Radiation Oncology

## 2018-10-21 DIAGNOSIS — Z510 Encounter for antineoplastic radiation therapy: Secondary | ICD-10-CM | POA: Diagnosis not present

## 2018-10-22 ENCOUNTER — Ambulatory Visit
Admission: RE | Admit: 2018-10-22 | Discharge: 2018-10-22 | Disposition: A | Discharge status: Home / Self Care | Payer: Medicare Other | Source: Ambulatory Visit | Attending: Radiation Oncology | Admitting: Radiation Oncology

## 2018-10-22 DIAGNOSIS — Z510 Encounter for antineoplastic radiation therapy: Secondary | ICD-10-CM | POA: Diagnosis not present

## 2018-10-23 ENCOUNTER — Ambulatory Visit
Admission: RE | Admit: 2018-10-23 | Discharge: 2018-10-23 | Disposition: A | Discharge status: Home / Self Care | Payer: Medicare Other | Source: Ambulatory Visit | Attending: Radiation Oncology | Admitting: Radiation Oncology

## 2018-10-23 DIAGNOSIS — Z510 Encounter for antineoplastic radiation therapy: Secondary | ICD-10-CM | POA: Diagnosis not present

## 2018-10-24 ENCOUNTER — Ambulatory Visit
Admission: RE | Admit: 2018-10-24 | Discharge: 2018-10-24 | Disposition: A | Discharge status: Home / Self Care | Payer: Medicare Other | Source: Ambulatory Visit | Attending: Radiation Oncology | Admitting: Radiation Oncology

## 2018-10-24 DIAGNOSIS — Z510 Encounter for antineoplastic radiation therapy: Secondary | ICD-10-CM | POA: Diagnosis not present

## 2018-10-27 ENCOUNTER — Ambulatory Visit
Admission: RE | Admit: 2018-10-27 | Discharge: 2018-10-27 | Disposition: A | Discharge status: Home / Self Care | Payer: Medicare Other | Source: Ambulatory Visit | Attending: Radiation Oncology | Admitting: Radiation Oncology

## 2018-10-27 DIAGNOSIS — Z510 Encounter for antineoplastic radiation therapy: Secondary | ICD-10-CM | POA: Diagnosis not present

## 2018-10-28 ENCOUNTER — Ambulatory Visit
Admission: RE | Admit: 2018-10-28 | Discharge: 2018-10-28 | Disposition: A | Discharge status: Home / Self Care | Payer: Medicare Other | Source: Ambulatory Visit | Attending: Radiation Oncology | Admitting: Radiation Oncology

## 2018-10-28 DIAGNOSIS — Z510 Encounter for antineoplastic radiation therapy: Secondary | ICD-10-CM | POA: Diagnosis not present

## 2018-10-29 ENCOUNTER — Ambulatory Visit
Admission: RE | Admit: 2018-10-29 | Discharge: 2018-10-29 | Disposition: A | Discharge status: Home / Self Care | Payer: Medicare Other | Source: Ambulatory Visit | Attending: Radiation Oncology | Admitting: Radiation Oncology

## 2018-10-29 DIAGNOSIS — Z510 Encounter for antineoplastic radiation therapy: Secondary | ICD-10-CM | POA: Diagnosis not present

## 2018-10-30 ENCOUNTER — Ambulatory Visit
Admission: RE | Admit: 2018-10-30 | Discharge: 2018-10-30 | Disposition: A | Discharge status: Home / Self Care | Payer: Medicare Other | Source: Ambulatory Visit | Attending: Radiation Oncology | Admitting: Radiation Oncology

## 2018-10-30 DIAGNOSIS — Z510 Encounter for antineoplastic radiation therapy: Secondary | ICD-10-CM | POA: Diagnosis not present

## 2018-10-31 ENCOUNTER — Ambulatory Visit
Admission: RE | Admit: 2018-10-31 | Discharge: 2018-10-31 | Disposition: A | Discharge status: Home / Self Care | Payer: Medicare Other | Source: Ambulatory Visit | Attending: Radiation Oncology | Admitting: Radiation Oncology

## 2018-10-31 DIAGNOSIS — Z510 Encounter for antineoplastic radiation therapy: Secondary | ICD-10-CM | POA: Diagnosis not present

## 2018-11-03 ENCOUNTER — Ambulatory Visit
Admission: RE | Admit: 2018-11-03 | Discharge: 2018-11-03 | Disposition: A | Discharge status: Home / Self Care | Payer: Medicare Other | Source: Ambulatory Visit | Attending: Radiation Oncology | Admitting: Radiation Oncology

## 2018-11-03 DIAGNOSIS — Z510 Encounter for antineoplastic radiation therapy: Secondary | ICD-10-CM | POA: Diagnosis not present

## 2018-11-04 ENCOUNTER — Ambulatory Visit (INDEPENDENT_AMBULATORY_CARE_PROVIDER_SITE_OTHER): Payer: Medicare Other

## 2018-11-04 ENCOUNTER — Ambulatory Visit
Admission: RE | Admit: 2018-11-04 | Discharge: 2018-11-04 | Disposition: A | Discharge status: Home / Self Care | Payer: Medicare Other | Source: Ambulatory Visit | Attending: Radiation Oncology | Admitting: Radiation Oncology

## 2018-11-04 DIAGNOSIS — R3915 Urgency of urination: Secondary | ICD-10-CM | POA: Diagnosis not present

## 2018-11-04 DIAGNOSIS — Z510 Encounter for antineoplastic radiation therapy: Secondary | ICD-10-CM | POA: Diagnosis not present

## 2018-11-04 NOTE — Progress Notes (Signed)
PTNS  Session # Monthly  Health & Social Factors: Prostate Cancer Caffeine: 2 Alcohol: 0 Daytime voids #per day: Patient unable to count Night-time voids #per night: 2 Urgency: Mild Incontinence Episodes #per day: 0 Ankle used: Right  Treatment Setting: 16 Feeling/ Response: Sensory & Toe Flex Comments: Patient states that since beginning radiation for prostate cancer he has experienced significant urinary frequency.   Preformed By: Gordy Clement, CMA (AAMA)   Follow Up: One month

## 2018-11-05 ENCOUNTER — Ambulatory Visit
Admission: RE | Admit: 2018-11-05 | Discharge: 2018-11-05 | Disposition: A | Discharge status: Home / Self Care | Payer: Medicare Other | Source: Ambulatory Visit | Attending: Radiation Oncology | Admitting: Radiation Oncology

## 2018-11-05 DIAGNOSIS — Z510 Encounter for antineoplastic radiation therapy: Secondary | ICD-10-CM | POA: Diagnosis not present

## 2018-11-06 ENCOUNTER — Ambulatory Visit
Admission: RE | Admit: 2018-11-06 | Discharge: 2018-11-06 | Disposition: A | Discharge status: Home / Self Care | Payer: Medicare Other | Source: Ambulatory Visit | Attending: Radiation Oncology | Admitting: Radiation Oncology

## 2018-11-06 DIAGNOSIS — Z510 Encounter for antineoplastic radiation therapy: Secondary | ICD-10-CM | POA: Diagnosis not present

## 2018-11-07 ENCOUNTER — Ambulatory Visit
Admission: RE | Admit: 2018-11-07 | Discharge: 2018-11-07 | Disposition: A | Discharge status: Home / Self Care | Payer: Medicare Other | Source: Ambulatory Visit | Attending: Radiation Oncology | Admitting: Radiation Oncology

## 2018-11-07 DIAGNOSIS — Z510 Encounter for antineoplastic radiation therapy: Secondary | ICD-10-CM | POA: Diagnosis not present

## 2018-11-10 ENCOUNTER — Ambulatory Visit
Admission: RE | Admit: 2018-11-10 | Discharge: 2018-11-10 | Disposition: A | Discharge status: Home / Self Care | Payer: Medicare Other | Source: Ambulatory Visit | Attending: Radiation Oncology | Admitting: Radiation Oncology

## 2018-11-10 DIAGNOSIS — Z510 Encounter for antineoplastic radiation therapy: Secondary | ICD-10-CM | POA: Diagnosis not present

## 2018-11-11 ENCOUNTER — Ambulatory Visit
Admission: RE | Admit: 2018-11-11 | Discharge: 2018-11-11 | Disposition: A | Discharge status: Home / Self Care | Payer: Medicare Other | Source: Ambulatory Visit | Attending: Radiation Oncology | Admitting: Radiation Oncology

## 2018-11-11 DIAGNOSIS — Z510 Encounter for antineoplastic radiation therapy: Secondary | ICD-10-CM | POA: Diagnosis not present

## 2018-11-12 ENCOUNTER — Ambulatory Visit: Payer: Medicare Other

## 2018-11-12 DIAGNOSIS — Z510 Encounter for antineoplastic radiation therapy: Secondary | ICD-10-CM | POA: Diagnosis not present

## 2018-11-17 ENCOUNTER — Ambulatory Visit
Admission: RE | Admit: 2018-11-17 | Discharge: 2018-11-17 | Disposition: A | Discharge status: Home / Self Care | Payer: Medicare Other | Source: Ambulatory Visit | Attending: Radiation Oncology | Admitting: Radiation Oncology

## 2018-11-17 DIAGNOSIS — C61 Malignant neoplasm of prostate: Secondary | ICD-10-CM | POA: Diagnosis not present

## 2018-11-17 DIAGNOSIS — Z510 Encounter for antineoplastic radiation therapy: Secondary | ICD-10-CM | POA: Insufficient documentation

## 2018-11-18 ENCOUNTER — Ambulatory Visit
Admission: RE | Admit: 2018-11-18 | Discharge: 2018-11-18 | Disposition: A | Discharge status: Home / Self Care | Payer: Medicare Other | Source: Ambulatory Visit | Attending: Radiation Oncology | Admitting: Radiation Oncology

## 2018-11-18 DIAGNOSIS — Z510 Encounter for antineoplastic radiation therapy: Secondary | ICD-10-CM | POA: Diagnosis not present

## 2018-11-19 ENCOUNTER — Ambulatory Visit
Admission: RE | Admit: 2018-11-19 | Discharge: 2018-11-19 | Disposition: A | Discharge status: Home / Self Care | Payer: Medicare Other | Source: Ambulatory Visit | Attending: Radiation Oncology | Admitting: Radiation Oncology

## 2018-11-19 DIAGNOSIS — Z510 Encounter for antineoplastic radiation therapy: Secondary | ICD-10-CM | POA: Diagnosis not present

## 2018-11-20 ENCOUNTER — Ambulatory Visit
Admission: RE | Admit: 2018-11-20 | Discharge: 2018-11-20 | Disposition: A | Discharge status: Home / Self Care | Payer: Medicare Other | Source: Ambulatory Visit | Attending: Radiation Oncology | Admitting: Radiation Oncology

## 2018-11-20 DIAGNOSIS — Z510 Encounter for antineoplastic radiation therapy: Secondary | ICD-10-CM | POA: Diagnosis not present

## 2018-11-21 ENCOUNTER — Ambulatory Visit: Payer: Medicare Other

## 2018-11-21 DIAGNOSIS — Z510 Encounter for antineoplastic radiation therapy: Secondary | ICD-10-CM | POA: Diagnosis not present

## 2018-12-03 ENCOUNTER — Ambulatory Visit (INDEPENDENT_AMBULATORY_CARE_PROVIDER_SITE_OTHER): Payer: Medicare Other | Admitting: Family Medicine

## 2018-12-03 DIAGNOSIS — R3915 Urgency of urination: Secondary | ICD-10-CM | POA: Diagnosis not present

## 2018-12-03 DIAGNOSIS — C61 Malignant neoplasm of prostate: Secondary | ICD-10-CM

## 2018-12-03 NOTE — Progress Notes (Signed)
PTNS  Session # monthly  Health & Social Factors: No change Caffeine: 2 Alcohol: 0 Daytime voids #per day: 10 Night-time voids #per night: 8 Urgency: Strong Incontinence Episodes #per day: 2 Ankle used: right Treatment Setting: 14 Feeling/ Response: Both Comments: patient tolerated well  Preformed By: Elberta Leatherwood, CMA   Follow Up: 1 month

## 2018-12-25 ENCOUNTER — Ambulatory Visit
Admission: RE | Admit: 2018-12-25 | Discharge: 2018-12-25 | Disposition: A | Discharge status: Home / Self Care | Payer: Medicare HMO | Source: Ambulatory Visit | Attending: Radiation Oncology | Admitting: Radiation Oncology

## 2018-12-25 ENCOUNTER — Other Ambulatory Visit: Payer: Self-pay

## 2018-12-25 ENCOUNTER — Encounter: Payer: Self-pay | Admitting: Radiation Oncology

## 2018-12-25 VITALS — BP 129/79 | HR 73 | Temp 96.8°F | Resp 18 | Wt 263.2 lb

## 2018-12-25 DIAGNOSIS — C61 Malignant neoplasm of prostate: Secondary | ICD-10-CM | POA: Diagnosis present

## 2018-12-25 DIAGNOSIS — Z923 Personal history of irradiation: Secondary | ICD-10-CM | POA: Insufficient documentation

## 2018-12-25 DIAGNOSIS — R197 Diarrhea, unspecified: Secondary | ICD-10-CM | POA: Diagnosis not present

## 2018-12-25 DIAGNOSIS — C775 Secondary and unspecified malignant neoplasm of intrapelvic lymph nodes: Secondary | ICD-10-CM | POA: Diagnosis not present

## 2018-12-25 NOTE — Progress Notes (Signed)
Radiation Oncology Follow up Note  Name: Jimmy Greer   Date:   12/25/2018 MRN:  [id number] DOB: 1941/03/08    This 78 y.o. male presents to the clinic today for one-month follow-up status post image guided radiation therapy to prostate and pelvic nodes for stage IIB.adenocarcinoma the prostate Gleason score of 8 (4+4).  REFERRING PROVIDER: Idelle Crouch, MD  HPI: patient is a 78 year old male now out 1 month having completed IM RT image guided radiation therapy to his prostate and pelvic nodes for a stage IIb Gleason 8 (4+4) adenocarcinoma the prostate. Seen today in routine follow-up he is doing fairly well still has some occasional intermittent diarrhea and does takeImodium when necessary. He follows no low-residue diet. He continues to have some urinary frequency and urgency although this preceded his treatments..  COMPLICATIONS OF TREATMENT: none  FOLLOW UP COMPLIANCE: keeps appointments   PHYSICAL EXAM:  BP 129/79   Pulse 73   Temp (!) 96.8 F (36 C)   Resp 18   Wt 263 lb 3.7 oz (119.4 kg)   BMI 38.87 kg/m  Well-developed well-nourished patient in NAD. HEENT reveals PERLA, EOMI, discs not visualized.  Oral cavity is clear. No oral mucosal lesions are identified. Neck is clear without evidence of cervical or supraclavicular adenopathy. Lungs are clear to A&P. Cardiac examination is essentially unremarkable with regular rate and rhythm without murmur rub or thrill. Abdomen is benign with no organomegaly or masses noted. Motor sensory and DTR levels are equal and symmetric in the upper and lower extremities. Cranial nerves II through XII are grossly intact. Proprioception is intact. No peripheral adenopathy or edema is identified. No motor or sensory levels are noted. Crude visual fields are within normal range.  RADIOLOGY RESULTS: no current films for review  PLAN: present time he has a low side effect profile from his previous I MRT radiation therapy. I'm please was overall  progress. I've asked to see him back in proximal 3 months for follow-up and will obtain a PSA at that time.he is currently on Lupron therapy delivered through urologist office. Patient is to call with any concerns.  I would like to take this opportunity to thank you for allowing me to participate in the care of your patient.Noreene Filbert, MD

## 2019-01-01 ENCOUNTER — Ambulatory Visit: Payer: Medicare HMO

## 2019-01-01 DIAGNOSIS — N138 Other obstructive and reflux uropathy: Secondary | ICD-10-CM

## 2019-01-01 DIAGNOSIS — N401 Enlarged prostate with lower urinary tract symptoms: Principal | ICD-10-CM

## 2019-01-01 DIAGNOSIS — R3915 Urgency of urination: Secondary | ICD-10-CM

## 2019-01-01 NOTE — Progress Notes (Signed)
PTNS  Session # Monthly Maintenance   Health & Social Factors: finished radiation Caffeine: 2 Alcohol: 0 Daytime voids #per day: 10 Night-time voids #per night: 8 Urgency: strong Incontinence Episodes #per day: 2 Ankle used: right Treatment Setting: 2 Feeling/ Response: sensory Comments: patient tolerated well  Preformed By: Elizabeth Palau, CMA(AAMA)

## 2019-02-05 ENCOUNTER — Ambulatory Visit (INDEPENDENT_AMBULATORY_CARE_PROVIDER_SITE_OTHER): Payer: Medicare HMO | Admitting: Family Medicine

## 2019-02-05 DIAGNOSIS — R3915 Urgency of urination: Secondary | ICD-10-CM

## 2019-02-05 NOTE — Progress Notes (Signed)
PTNS  Session # monthly  Health & Social Factors: no change Caffeine: 2 Alcohol: 0 Daytime voids #per day: 8 Night-time voids #per night: 6 Urgency: strong Incontinence Episodes #per day: 2 Ankle used: left Treatment Setting: 17 Feeling/ Response: sensory Comments: Patient tolerated well  Preformed By: Elberta Leatherwood, CMA  Follow Up: 1 month

## 2019-03-02 ENCOUNTER — Telehealth: Payer: Self-pay | Admitting: Urology

## 2019-03-02 NOTE — Telephone Encounter (Signed)
See my chart message

## 2019-03-02 NOTE — Telephone Encounter (Signed)
Is he still taking the Myrbetriq?

## 2019-03-02 NOTE — Telephone Encounter (Signed)
IDK we didn't talk about that

## 2019-03-02 NOTE — Telephone Encounter (Signed)
pt no longer wants to have PTNS due to ins charging him a copay   [employer]

## 2019-03-06 ENCOUNTER — Ambulatory Visit: Payer: PRIVATE HEALTH INSURANCE

## 2019-03-18 ENCOUNTER — Ambulatory Visit (INDEPENDENT_AMBULATORY_CARE_PROVIDER_SITE_OTHER): Payer: Medicare HMO

## 2019-03-18 ENCOUNTER — Other Ambulatory Visit: Payer: Self-pay

## 2019-03-18 ENCOUNTER — Telehealth: Payer: Self-pay | Admitting: Urology

## 2019-03-18 DIAGNOSIS — C61 Malignant neoplasm of prostate: Secondary | ICD-10-CM | POA: Diagnosis not present

## 2019-03-18 MED ORDER — LEUPROLIDE ACETATE (6 MONTH) 45 MG IM KIT
45.0000 mg | PACK | Freq: Once | INTRAMUSCULAR | Status: AC
  Administered 2019-03-18: 14:00:00 45 mg via INTRAMUSCULAR

## 2019-03-18 NOTE — Telephone Encounter (Addendum)
Spoke with patient-per Dr. Diamantina Providence patient should keep appointment. Patient verbalized understanding.

## 2019-03-18 NOTE — Progress Notes (Signed)
Lupron IM Injection   Due to Prostate Cancer patient is present today for a Lupron Injection.  Medication: Lupron 6 month Dose: 45mg   Location: right upper outer buttocks Lot: [id number] Exp: 04/22/2021  Patient tolerated well Performed by: Verlene Mayer, CMA  Follow up:6 month

## 2019-03-18 NOTE — Telephone Encounter (Signed)
Pt LMOM and asked if the Lupron shot would effect his immune system. Please advise.

## 2019-04-17 ENCOUNTER — Encounter: Payer: Self-pay | Admitting: *Deleted

## 2019-04-29 ENCOUNTER — Other Ambulatory Visit: Payer: Self-pay

## 2019-04-30 ENCOUNTER — Other Ambulatory Visit: Payer: Self-pay

## 2019-04-30 ENCOUNTER — Inpatient Hospital Stay: Payer: Medicare HMO | Attending: Radiation Oncology

## 2019-04-30 ENCOUNTER — Other Ambulatory Visit: Payer: Medicare HMO

## 2019-04-30 ENCOUNTER — Other Ambulatory Visit: Payer: Self-pay | Admitting: *Deleted

## 2019-04-30 DIAGNOSIS — C61 Malignant neoplasm of prostate: Secondary | ICD-10-CM

## 2019-04-30 LAB — PSA: Prostatic Specific Antigen: <0.01 ng/mL (ref 0.00–4.00)

## 2019-05-05 ENCOUNTER — Encounter: Payer: Self-pay | Admitting: Radiation Oncology

## 2019-05-08 ENCOUNTER — Ambulatory Visit: Payer: Medicare HMO | Admitting: Radiation Oncology

## 2019-06-05 ENCOUNTER — Other Ambulatory Visit: Payer: Medicare HMO

## 2019-06-09 DIAGNOSIS — B351 Tinea unguium: Secondary | ICD-10-CM | POA: Diagnosis not present

## 2019-06-09 DIAGNOSIS — E1142 Type 2 diabetes mellitus with diabetic polyneuropathy: Secondary | ICD-10-CM | POA: Diagnosis not present

## 2019-06-09 DIAGNOSIS — Z7901 Long term (current) use of anticoagulants: Secondary | ICD-10-CM | POA: Diagnosis not present

## 2019-06-11 ENCOUNTER — Other Ambulatory Visit: Payer: Self-pay

## 2019-06-12 ENCOUNTER — Encounter: Payer: Self-pay | Admitting: Radiation Oncology

## 2019-06-12 ENCOUNTER — Ambulatory Visit
Admission: RE | Admit: 2019-06-12 | Discharge: 2019-06-12 | Disposition: A | Discharge status: Home / Self Care | Payer: Medicare HMO | Source: Ambulatory Visit | Attending: Radiation Oncology | Admitting: Radiation Oncology

## 2019-06-12 ENCOUNTER — Other Ambulatory Visit: Payer: Self-pay

## 2019-06-12 VITALS — BP 127/78 | HR 75 | Temp 96.8°F | Resp 18 | Wt 261.9 lb

## 2019-06-12 DIAGNOSIS — C61 Malignant neoplasm of prostate: Secondary | ICD-10-CM | POA: Insufficient documentation

## 2019-06-12 DIAGNOSIS — Z923 Personal history of irradiation: Secondary | ICD-10-CM | POA: Diagnosis not present

## 2019-06-12 NOTE — Progress Notes (Signed)
Radiation Oncology Follow up Note  Name: Jimmy Greer   Date:   06/12/2019 MRN:  [id number] DOB: [birth year]    This 78 y.o. male presents to the clinic today for 71-month follow-up status post IMRT to prostate and pelvic nodes for stage IIb adenocarcinoma the prostate Gleason score of 8.  REFERRING PROVIDER: Idelle Crouch, MD  HPI: Patient is a 78 year old male now out 5 months having completed IMRT radiation therapy to his prostate and pelvic nodes for Gleason 8 (4+4) adenocarcinoma presenting with a PSA of.  8.6.  He is seen today in routine follow-up is doing fairly well still has some loose stools but this has predated his radiation therapy treatments.  He also has some decreased flow does take Flomax twice a day.  Has not been back to see urology I have requested he make that appointment.  His most recent PSA is less than 6.76 COMPLICATIONS OF TREATMENT: none  FOLLOW UP COMPLIANCE: keeps appointments   PHYSICAL EXAM:  BP 127/78 (BP Location: Left Arm, Patient Position: Sitting)   Pulse 75   Temp (!) 96.8 F (36 C) (Tympanic)   Resp 18   Wt 261 lb 14.5 oz (118.8 kg)   BMI 38.68 kg/m  Well-developed well-nourished patient in NAD. HEENT reveals PERLA, EOMI, discs not visualized.  Oral cavity is clear. No oral mucosal lesions are identified. Neck is clear without evidence of cervical or supraclavicular adenopathy. Lungs are clear to A&P. Cardiac examination is essentially unremarkable with regular rate and rhythm without murmur rub or thrill. Abdomen is benign with no organomegaly or masses noted. Motor sensory and DTR levels are equal and symmetric in the upper and lower extremities. Cranial nerves II through XII are grossly intact. Proprioception is intact. No peripheral adenopathy or edema is identified. No motor or sensory levels are noted. Crude visual fields are within normal range.  RADIOLOGY RESULTS: No current films for review  PLAN: Present time patient is doing well  under excellent biochemical control of his prostate cancer.  I like to keep keep him on androgen suppression therapy and have asked him to return to urology to make sure that is being performed.  Otherwise I have asked to see him back in 6 months with a PSA at that time.  Patient will also discuss his lower urinary tract symptoms with urology.  Patient knows to call with any concerns.  I would like to take this opportunity to thank you for allowing me to participate in the care of your patient.Noreene Filbert, MD

## 2019-07-13 DIAGNOSIS — Z7901 Long term (current) use of anticoagulants: Secondary | ICD-10-CM | POA: Diagnosis not present

## 2019-08-05 DIAGNOSIS — I10 Essential (primary) hypertension: Secondary | ICD-10-CM | POA: Diagnosis not present

## 2019-08-05 DIAGNOSIS — Z7901 Long term (current) use of anticoagulants: Secondary | ICD-10-CM | POA: Diagnosis not present

## 2019-08-05 DIAGNOSIS — Z79899 Other long term (current) drug therapy: Secondary | ICD-10-CM | POA: Diagnosis not present

## 2019-08-05 DIAGNOSIS — E782 Mixed hyperlipidemia: Secondary | ICD-10-CM | POA: Diagnosis not present

## 2019-08-05 DIAGNOSIS — I2782 Chronic pulmonary embolism: Secondary | ICD-10-CM | POA: Diagnosis not present

## 2019-08-05 DIAGNOSIS — K210 Gastro-esophageal reflux disease with esophagitis: Secondary | ICD-10-CM | POA: Diagnosis not present

## 2019-08-05 DIAGNOSIS — E118 Type 2 diabetes mellitus with unspecified complications: Secondary | ICD-10-CM | POA: Diagnosis not present

## 2019-08-12 DIAGNOSIS — Z7901 Long term (current) use of anticoagulants: Secondary | ICD-10-CM | POA: Diagnosis not present

## 2019-08-12 DIAGNOSIS — Z79899 Other long term (current) drug therapy: Secondary | ICD-10-CM | POA: Diagnosis not present

## 2019-08-12 DIAGNOSIS — K219 Gastro-esophageal reflux disease without esophagitis: Secondary | ICD-10-CM | POA: Diagnosis not present

## 2019-08-12 DIAGNOSIS — E118 Type 2 diabetes mellitus with unspecified complications: Secondary | ICD-10-CM | POA: Diagnosis not present

## 2019-08-12 DIAGNOSIS — R195 Other fecal abnormalities: Secondary | ICD-10-CM | POA: Diagnosis not present

## 2019-09-14 DIAGNOSIS — Z7901 Long term (current) use of anticoagulants: Secondary | ICD-10-CM | POA: Diagnosis not present

## 2019-09-17 DIAGNOSIS — Z23 Encounter for immunization: Secondary | ICD-10-CM | POA: Diagnosis not present

## 2019-09-18 ENCOUNTER — Ambulatory Visit: Payer: PRIVATE HEALTH INSURANCE

## 2019-09-18 ENCOUNTER — Encounter: Payer: Self-pay | Admitting: Urology

## 2019-09-21 DIAGNOSIS — E1142 Type 2 diabetes mellitus with diabetic polyneuropathy: Secondary | ICD-10-CM | POA: Diagnosis not present

## 2019-09-21 DIAGNOSIS — B351 Tinea unguium: Secondary | ICD-10-CM | POA: Diagnosis not present

## 2019-09-22 ENCOUNTER — Other Ambulatory Visit: Payer: Self-pay

## 2019-09-22 ENCOUNTER — Ambulatory Visit (INDEPENDENT_AMBULATORY_CARE_PROVIDER_SITE_OTHER): Payer: Medicare HMO

## 2019-09-22 DIAGNOSIS — C61 Malignant neoplasm of prostate: Secondary | ICD-10-CM | POA: Diagnosis not present

## 2019-09-22 MED ORDER — LEUPROLIDE ACETATE (6 MONTH) 45 MG ~~LOC~~ KIT
45.0000 mg | PACK | Freq: Once | SUBCUTANEOUS | Status: AC
  Administered 2019-09-22: 45 mg via SUBCUTANEOUS

## 2019-09-22 NOTE — Progress Notes (Signed)
Eligard SubQ Injection   Due to Prostate Cancer patient is present today for a Eligard Injection.  Medication: Eligard 6 month Dose: 45 mg  Location: left  Lot: [id number] Exp: 02/2021  Patient tolerated well, no complications were noted  Performed by: Gaspar Cola CMA   Follow up: 6 months.

## 2019-09-22 NOTE — Patient Instructions (Signed)

## 2019-10-08 DIAGNOSIS — H2513 Age-related nuclear cataract, bilateral: Secondary | ICD-10-CM | POA: Diagnosis not present

## 2019-10-08 DIAGNOSIS — H524 Presbyopia: Secondary | ICD-10-CM | POA: Diagnosis not present

## 2019-10-08 DIAGNOSIS — H5213 Myopia, bilateral: Secondary | ICD-10-CM | POA: Diagnosis not present

## 2019-10-08 DIAGNOSIS — H52223 Regular astigmatism, bilateral: Secondary | ICD-10-CM | POA: Diagnosis not present

## 2019-10-08 DIAGNOSIS — Z7984 Long term (current) use of oral hypoglycemic drugs: Secondary | ICD-10-CM | POA: Diagnosis not present

## 2019-10-08 DIAGNOSIS — E119 Type 2 diabetes mellitus without complications: Secondary | ICD-10-CM | POA: Diagnosis not present

## 2019-10-15 DIAGNOSIS — Z7901 Long term (current) use of anticoagulants: Secondary | ICD-10-CM | POA: Diagnosis not present

## 2019-11-09 DIAGNOSIS — Z87891 Personal history of nicotine dependence: Secondary | ICD-10-CM | POA: Diagnosis not present

## 2019-11-09 DIAGNOSIS — Z79899 Other long term (current) drug therapy: Secondary | ICD-10-CM | POA: Diagnosis not present

## 2019-11-09 DIAGNOSIS — E782 Mixed hyperlipidemia: Secondary | ICD-10-CM | POA: Diagnosis not present

## 2019-11-09 DIAGNOSIS — I10 Essential (primary) hypertension: Secondary | ICD-10-CM | POA: Diagnosis not present

## 2019-11-09 DIAGNOSIS — M5136 Other intervertebral disc degeneration, lumbar region: Secondary | ICD-10-CM | POA: Diagnosis not present

## 2019-11-09 DIAGNOSIS — I2699 Other pulmonary embolism without acute cor pulmonale: Secondary | ICD-10-CM | POA: Diagnosis not present

## 2019-11-09 DIAGNOSIS — Z7901 Long term (current) use of anticoagulants: Secondary | ICD-10-CM | POA: Diagnosis not present

## 2019-11-09 DIAGNOSIS — E119 Type 2 diabetes mellitus without complications: Secondary | ICD-10-CM | POA: Diagnosis not present

## 2019-12-14 DIAGNOSIS — Z7901 Long term (current) use of anticoagulants: Secondary | ICD-10-CM | POA: Diagnosis not present

## 2019-12-23 ENCOUNTER — Other Ambulatory Visit: Payer: Self-pay

## 2019-12-23 ENCOUNTER — Ambulatory Visit (INDEPENDENT_AMBULATORY_CARE_PROVIDER_SITE_OTHER): Payer: Medicare Other | Admitting: Urology

## 2019-12-23 ENCOUNTER — Encounter: Payer: Self-pay | Admitting: Urology

## 2019-12-23 DIAGNOSIS — N3941 Urge incontinence: Secondary | ICD-10-CM | POA: Diagnosis not present

## 2019-12-23 DIAGNOSIS — C61 Malignant neoplasm of prostate: Secondary | ICD-10-CM | POA: Diagnosis not present

## 2019-12-23 DIAGNOSIS — R350 Frequency of micturition: Secondary | ICD-10-CM | POA: Diagnosis not present

## 2019-12-23 LAB — BLADDER SCAN AMB NON-IMAGING

## 2019-12-23 NOTE — Progress Notes (Signed)
12/23/2019 2:27 PM   Jimmy Greer 1941/01/30 [id number]  Referring provider: Idelle Crouch, MD [facility] [place],  [place] [address]  Chief Complaint  Patient presents with  . Urinary Frequency    Urologic history: 1. T2 high risk prostate cancer -Diagnosed 07/2018; PSA 8.6; 48 cc gland -IMRT plus ADT as primary treatment -IMRT completed December 2019   HPI: 79 y.o. male presents for follow-up.  He saw Dr. Baruch Gouty June 2020 and PSA was < 0.01.  He states he is scheduled for a blood draw at the [facility] tomorrow.  He has persistent lower urinary tract symptoms consisting of urinary frequency, urgency with urge incontinence.  He tried Myrbetriq which was helpful however too expensive.  He has been on oxybutynin also but does not remember the efficacy or why it was discontinued.  He is on tamsulosin.  Denies gross hematuria.   PMH: Past Medical History:  Diagnosis Date  . Arthritis   . Bleeding disorder ([place])   . Cancer ([place])   . Diabetes mellitus without complication ([place])   . GERD (gastroesophageal reflux disease)   . Hypertension     Surgical History: Past Surgical History:  Procedure Laterality Date  . APPENDECTOMY    . CARPAL TUNNEL RELEASE    . COLONOSCOPY WITH PROPOFOL N/A 09/25/2017   Procedure: COLONOSCOPY WITH PROPOFOL;  Surgeon: Toledo, Benay Pike, MD;  Location: [facility] ENDOSCOPY;  Service: Endoscopy;  Laterality: N/A;  . ESOPHAGOGASTRODUODENOSCOPY (EGD) WITH PROPOFOL N/A 09/25/2017   Procedure: ESOPHAGOGASTRODUODENOSCOPY (EGD) WITH PROPOFOL;  Surgeon: Toledo, Benay Pike, MD;  Location: [facility] ENDOSCOPY;  Service: Endoscopy;  Laterality: N/A;  . GALLBLADDER SURGERY    . REPLACEMENT TOTAL KNEE Bilateral     Home Medications:  Allergies as of 12/23/2019   No Known Allergies     Medication List       Accurate as of December 23, 2019  2:27 PM. If you have any questions, ask your nurse or doctor.        STOP taking  these medications   [employer] VITAMIN B12 1000 MCG tablet Generic drug: cyanocobalamin Stopped by: Abbie Sons, MD   HYDROcodone-acetaminophen 5-325 MG tablet Commonly known as: NORCO/VICODIN Stopped by: Abbie Sons, MD   Inositol 650 MG Tabs Stopped by: Abbie Sons, MD   mirabegron ER 25 MG Tb24 tablet Commonly known as: MYRBETRIQ Stopped by: Abbie Sons, MD   oxybutynin 10 MG 24 hr tablet Commonly known as: DITROPAN-XL Stopped by: Abbie Sons, MD     TAKE these medications   allopurinol 300 MG tablet Commonly known as: ZYLOPRIM TAKE 1 TABLET ONE TIME DAILY   aspirin EC 81 MG tablet Take by mouth.   cholestyramine light 4 GM/DOSE powder Commonly known as: PREVALITE Take by mouth.   gabapentin 600 MG tablet Commonly known as: NEURONTIN TAKE 1 TABLET THREE TIMES DAILY   hydrochlorothiazide 12.5 MG capsule Commonly known as: MICROZIDE Take 12.5 mg by mouth daily.   Melatonin 5 MG Caps Take by mouth.   meloxicam 7.5 MG tablet Commonly known as: MOBIC Take 7.5 mg by mouth daily.   metFORMIN 1000 MG tablet Commonly known as: GLUCOPHAGE Take 1,000 mg by mouth 2 (two) times daily with a meal.   Multi-Vitamins Tabs Take by mouth.   omeprazole 40 MG capsule Commonly known as: PRILOSEC TAKE 1 CAPSULE TWICE DAILY   pioglitazone 15 MG tablet Commonly known as: ACTOS TAKE 1 TABLET ONE TIME DAILY  sildenafil 20 MG tablet Commonly known as: REVATIO Take 20 mg by mouth as needed.   simvastatin 40 MG tablet Commonly known as: ZOCOR TAKE 1 TABLET EVERY NIGHT   tamsulosin 0.4 MG Caps capsule Commonly known as: FLOMAX TAKE 1 CAPSULE TWICE DAILY   traMADol 50 MG tablet Commonly known as: ULTRAM Take by mouth every 6 (six) hours as needed.   warfarin 5 MG tablet Commonly known as: COUMADIN Take by mouth. 2 days per week   warfarin 3 MG tablet Commonly known as: COUMADIN Take 3 mg by mouth. Five days per week       Allergies: No Known  Allergies  Family History: Family History  Problem Relation Age of Onset  . Prostate cancer Brother   . Bladder Cancer Neg Hx   . Kidney cancer Neg Hx     Social History:  reports that he has quit smoking. He has never used smokeless tobacco. He reports current alcohol use. He reports that he does not use drugs.  ROS: UROLOGY Frequent Urination?: Yes Hard to postpone urination?: No Burning/pain with urination?: No Get up at night to urinate?: Yes Leakage of urine?: Yes Urine stream starts and stops?: No Trouble starting stream?: No Do you have to strain to urinate?: No Blood in urine?: No Urinary tract infection?: No Sexually transmitted disease?: No Injury to kidneys or bladder?: No Painful intercourse?: No Weak stream?: No Erection problems?: Yes Penile pain?: No  Gastrointestinal Nausea?: No Vomiting?: No Indigestion/heartburn?: No Diarrhea?: Yes Constipation?: No  Constitutional Fever: No Night sweats?: Yes Weight loss?: No Fatigue?: No  Skin Skin rash/lesions?: No Itching?: No  Eyes Blurred vision?: No Double vision?: No  Ears/Nose/Throat Sore throat?: No Sinus problems?: No  Hematologic/Lymphatic Swollen glands?: No Easy bruising?: No  Cardiovascular Leg swelling?: Yes Chest pain?: No  Respiratory Cough?: No Shortness of breath?: Yes  Endocrine Excessive thirst?: No  Musculoskeletal Back pain?: Yes Joint pain?: Yes  Neurological Headaches?: No Dizziness?: No  Psychologic Depression?: No Anxiety?: No  Physical Exam: BP 101/63 (BP Location: Left Arm, Patient Position: Sitting, Cuff Size: Normal)   Pulse 76   Ht 5\' 9"  (1.753 m)   Wt 264 lb (119.7 kg)   BMI 38.99 kg/m   Constitutional:  Alert and oriented, No acute distress. HEENT: [place] AT, moist mucus membranes.  Trachea midline, no masses. Cardiovascular: No clubbing, cyanosis, or edema. Respiratory: Normal respiratory effort, no increased work of breathing.  Laboratory  Data:  Urinalysis Dipstick 2+ blood Microscopy negative  Assessment & Plan:    - T2 very high risk prostate cancer IMRT completed and he remains on ADT.  PSA undetectable.  - Lower urinary tract symptoms Persistent, severe storage related voiding symptoms.  Schedule cystoscopy to evaluate for the possibility of urethral stricture.  No significant residual by bladder scan.  Trial of Toviaz 4 mg daily.  Given 2 weeks of samples.  Potential side effects of dry mouth, constipation were discussed.  If beneficial he will call back for Rx.   Abbie Sons, [place] [address] [place], [place] [address] [phone]

## 2019-12-24 ENCOUNTER — Other Ambulatory Visit: Payer: Self-pay

## 2019-12-24 ENCOUNTER — Encounter: Payer: Self-pay | Admitting: Urology

## 2019-12-24 ENCOUNTER — Inpatient Hospital Stay: Payer: Medicare Other | Attending: Radiation Oncology

## 2019-12-24 DIAGNOSIS — Z923 Personal history of irradiation: Secondary | ICD-10-CM | POA: Insufficient documentation

## 2019-12-24 DIAGNOSIS — R350 Frequency of micturition: Secondary | ICD-10-CM | POA: Insufficient documentation

## 2019-12-24 DIAGNOSIS — C61 Malignant neoplasm of prostate: Secondary | ICD-10-CM | POA: Insufficient documentation

## 2019-12-24 DIAGNOSIS — N3941 Urge incontinence: Secondary | ICD-10-CM | POA: Insufficient documentation

## 2019-12-24 LAB — PSA: Prostatic Specific Antigen: <0.01 ng/mL (ref 0.00–4.00)

## 2019-12-25 LAB — URINALYSIS, COMPLETE
Bilirubin, UA: NEGATIVE
Glucose, UA: NEGATIVE
Ketones, UA: NEGATIVE
Leukocytes,UA: NEGATIVE
Nitrite, UA: NEGATIVE
Protein,UA: NEGATIVE
Specific Gravity, UA: 1.02 (ref 1.005–1.030)
Urobilinogen, Ur: 0.2 mg/dL (ref 0.2–1.0)
pH, UA: 5 (ref 5.0–7.5)

## 2019-12-25 LAB — MICROSCOPIC EXAMINATION
Bacteria, UA: NONE SEEN
Epithelial Cells (non renal): NONE SEEN /hpf (ref 0–10)
WBC, UA: NONE SEEN /hpf (ref 0–5)

## 2019-12-28 ENCOUNTER — Other Ambulatory Visit: Payer: Self-pay | Admitting: Internal Medicine

## 2019-12-28 DIAGNOSIS — M7989 Other specified soft tissue disorders: Secondary | ICD-10-CM

## 2019-12-31 ENCOUNTER — Ambulatory Visit: Payer: Medicare HMO | Admitting: Radiation Oncology

## 2020-01-01 ENCOUNTER — Ambulatory Visit
Admission: RE | Admit: 2020-01-01 | Discharge: 2020-01-01 | Disposition: A | Discharge status: Home / Self Care | Payer: Medicare Other | Source: Ambulatory Visit | Attending: Internal Medicine | Admitting: Internal Medicine

## 2020-01-01 ENCOUNTER — Other Ambulatory Visit: Payer: Self-pay

## 2020-01-01 DIAGNOSIS — M7989 Other specified soft tissue disorders: Secondary | ICD-10-CM | POA: Insufficient documentation

## 2020-01-04 ENCOUNTER — Other Ambulatory Visit: Payer: Self-pay

## 2020-01-05 ENCOUNTER — Other Ambulatory Visit: Payer: Self-pay

## 2020-01-05 ENCOUNTER — Encounter: Payer: Self-pay | Admitting: Radiation Oncology

## 2020-01-05 ENCOUNTER — Ambulatory Visit: Payer: Medicare Other | Attending: Internal Medicine

## 2020-01-05 ENCOUNTER — Ambulatory Visit
Admission: RE | Admit: 2020-01-05 | Discharge: 2020-01-05 | Disposition: A | Discharge status: Home / Self Care | Payer: Medicare Other | Source: Ambulatory Visit | Attending: Radiation Oncology | Admitting: Radiation Oncology

## 2020-01-05 VITALS — BP 134/68 | HR 75 | Wt 259.8 lb

## 2020-01-05 DIAGNOSIS — C61 Malignant neoplasm of prostate: Secondary | ICD-10-CM

## 2020-01-05 DIAGNOSIS — Z923 Personal history of irradiation: Secondary | ICD-10-CM | POA: Diagnosis not present

## 2020-01-05 DIAGNOSIS — R351 Nocturia: Secondary | ICD-10-CM | POA: Diagnosis not present

## 2020-01-05 DIAGNOSIS — Z23 Encounter for immunization: Secondary | ICD-10-CM | POA: Insufficient documentation

## 2020-01-05 NOTE — Progress Notes (Signed)
Radiation Oncology Follow up Note  Name: Jimmy Greer   Date:   01/05/2020 MRN:  [id number] DOB: July 08, 1941    This 79 y.o. male presents to the clinic today for 102-month follow-up status post IMRT to his prostate and pelvic nodes for stage IIb adenocarcinoma the prostate Gleason score of 8 (4+4)..  REFERRING PROVIDER: Idelle Crouch, MD  HPI: Patient is a 79 year old male now at 64 months having completed IMRT radiation therapy to his prostate and pelvic nodes for Gleason 8 (4+4) adenocarcinoma.  Who presented with a PSA of 8.6 seen today in routine follow-up he is still having problems with nocturia x5 frequency and urgency.  He is currently on Flomax this is still being worked up by Dr. Bernardo Heater and a plan cystoscopy is in the future.  He has slight intermittent diarrhea does not follow any low residue diet does not really take Imodium.  His most recent PSA is less than 0.01 patient was takingMyrbetriq although he is discontinued that on his own based on the side effect profile.  COMPLICATIONS OF TREATMENT: none  FOLLOW UP COMPLIANCE: keeps appointments   PHYSICAL EXAM:  BP 134/68   Pulse 75   Wt 259 lb 12.8 oz (117.8 kg)   BMI 38.37 kg/m  Well-developed well-nourished patient in NAD. HEENT reveals PERLA, EOMI, discs not visualized.  Oral cavity is clear. No oral mucosal lesions are identified. Neck is clear without evidence of cervical or supraclavicular adenopathy. Lungs are clear to A&P. Cardiac examination is essentially unremarkable with regular rate and rhythm without murmur rub or thrill. Abdomen is benign with no organomegaly or masses noted. Motor sensory and DTR levels are equal and symmetric in the upper and lower extremities. Cranial nerves II through XII are grossly intact. Proprioception is intact. No peripheral adenopathy or edema is identified. No motor or sensory levels are noted. Crude visual fields are within normal range.  RADIOLOGY RESULTS: No current films for  review  PLAN: Present time patient is under excellent control of his prostate cancer with almost undetectable PSA.  He continues close follow-up care with urology and I asked him to direct his questions about his lower urinary tract symptoms to Dr. Bernardo Heater and his staff.  He otherwise is doing well and I have asked to see him back in 1 year for follow-up.  I have also suggested he continues on androgen deprivation therapy for another year.  Patient and his wife both seem to comprehend my treatment plan well.  I would like to take this opportunity to thank you for allowing me to participate in the care of your patient.Noreene Filbert, MD

## 2020-01-05 NOTE — Progress Notes (Signed)
   Covid-19 Vaccination Clinic  Name:  Jimmy Greer    MRN: [id number] DOB: 1941-01-01  01/05/2020  Mr. Jimmy Greer was observed post Covid-19 immunization for 15 minutes without incidence. He was provided with Vaccine Information Sheet and instruction to access the V-Safe system.   Mr. Jimmy Greer was instructed to call 911 with any severe reactions post vaccine: Marland Kitchen Difficulty breathing  . Swelling of your face and throat  . A fast heartbeat  . A bad rash all over your body  . Dizziness and weakness    Immunizations Administered    Name Date Dose VIS Date Route   Pfizer COVID-19 Vaccine 01/05/2020  4:37 PM 0.3 mL 11/27/2019 Intramuscular   Manufacturer: [place]   Lot: [id number]   [place]: [id number]

## 2020-01-13 ENCOUNTER — Encounter: Payer: Self-pay | Admitting: Urology

## 2020-01-13 ENCOUNTER — Other Ambulatory Visit: Payer: Self-pay

## 2020-01-13 ENCOUNTER — Ambulatory Visit (INDEPENDENT_AMBULATORY_CARE_PROVIDER_SITE_OTHER): Payer: Medicare Other | Admitting: Urology

## 2020-01-13 VITALS — BP 124/73 | HR 78 | Ht 69.0 in | Wt 259.0 lb

## 2020-01-13 DIAGNOSIS — R350 Frequency of micturition: Secondary | ICD-10-CM | POA: Diagnosis not present

## 2020-01-13 NOTE — Patient Instructions (Signed)

## 2020-01-14 NOTE — Progress Notes (Signed)
   01/14/20  CC:  Chief Complaint  Patient presents with  . Cysto    HPI: 79 y.o. male with history of prostate cancer status post IMRT with persistent storage related voiding symptoms.  See my note 12/23/2019.  He had minimal improvement with Toviaz and had significant dry mouth.  Blood pressure 124/73, pulse 78, height 5\' 9"  (1.753 m), weight 259 lb (117.5 kg). NED. A&Ox3.   No respiratory distress   Abd soft, NT, ND Normal phallus with bilateral descended testicles  Cystoscopy Procedure Note  Patient identification was confirmed, informed consent was obtained, and patient was prepped using Betadine solution.  Lidocaine jelly was administered per urethral meatus.     Pre-Procedure: - Inspection reveals a normal caliber ureteral meatus.  Procedure: The flexible cystoscope was introduced without difficulty - No urethral strictures/lesions are present. - Nonocclusive prostate  - Normal bladder neck - Bilateral ureteral orifices identified - Bladder mucosa  reveals no ulcers, tumors, or lesions - No bladder stones - No trabeculation  Retroflexion shows no abnormalities   Post-Procedure: - Patient tolerated the procedure well  Assessment/ Plan: -No urethral stricture/bladder neck contracture -Normal bladder mucosa -He does not remember efficacy of Myrbetriq and was given samples x4 weeks -Virtual follow-up 1 month   Abbie Sons, MD

## 2020-01-15 LAB — MICROSCOPIC EXAMINATION: RBC: NONE SEEN /hpf (ref 0–2)

## 2020-01-15 LAB — URINALYSIS, COMPLETE
Bilirubin, UA: NEGATIVE
Glucose, UA: NEGATIVE
Ketones, UA: NEGATIVE
Leukocytes,UA: NEGATIVE
Nitrite, UA: NEGATIVE
Protein,UA: NEGATIVE
Specific Gravity, UA: 1.015 (ref 1.005–1.030)
Urobilinogen, Ur: 0.2 mg/dL (ref 0.2–1.0)
pH, UA: 5 (ref 5.0–7.5)

## 2020-01-23 ENCOUNTER — Ambulatory Visit: Payer: Medicare Other | Attending: Internal Medicine

## 2020-01-23 DIAGNOSIS — Z23 Encounter for immunization: Secondary | ICD-10-CM | POA: Insufficient documentation

## 2020-01-23 NOTE — Progress Notes (Signed)
   Covid-19 Vaccination Clinic  Name:  Jimmy Greer    MRN: [id number] DOB: Sep 21, 1941  01/23/2020  Mr. [name] was observed post Covid-19 immunization for 15 minutes without incidence. He was provided with Vaccine Information Sheet and instruction to access the V-Safe system.   Mr. [name] was instructed to call 911 with any severe reactions post vaccine: Marland Kitchen Difficulty breathing  . Swelling of your face and throat  . A fast heartbeat  . A bad rash all over your body  . Dizziness and weakness    Immunizations Administered    Name Date Dose VIS Date Route   Pfizer COVID-19 Vaccine 01/23/2020 10:53 AM 0.3 mL 11/27/2019 Intramuscular   Manufacturer: [place]   Lot: [id number]   [place]: [id number]

## 2020-02-17 ENCOUNTER — Other Ambulatory Visit: Payer: Self-pay

## 2020-02-17 ENCOUNTER — Telehealth (INDEPENDENT_AMBULATORY_CARE_PROVIDER_SITE_OTHER): Payer: Medicare Other | Admitting: Urology

## 2020-02-17 DIAGNOSIS — C61 Malignant neoplasm of prostate: Secondary | ICD-10-CM

## 2020-02-17 NOTE — Progress Notes (Signed)
Virtual Visit via Telephone Note  I connected with Jimmy Greer on 02/17/20 at 3:45EST by telephone and verified that I am speaking with the correct person using two identifiers.  Location: Patient: Home Provider: [name]   I discussed the limitations, risks, security and privacy concerns of performing an evaluation and management service by telephone and the availability of in person appointments. I also discussed with the patient that there may be a patient responsible charge related to this service. The patient expressed understanding and agreed to proceed.  Urologic history: 1. T2 high risk prostate cancer -Diagnosed 07/2018; PSA 8.6; 48 cc gland -IMRT plus ADT as primary treatment -IMRT completed December 2019  History of Present Illness: 79 y.o. male with persistent urinary frequency, urgency and urge incontinence since completely IMRT for high risk prostate cancer 11/2018.  He is on tamsulosin.  No significant improvement with oxybutynin or Myrbetriq.  Cystoscopy last month showed no urethral stricture or bladder neck contracture.  Prostate was nonocclusive.  He also underwent PTNS which was not effective.  He had 2 additional questions today.  He does feel his penis is smaller since undergoing radiation and states he does have some delay with urge onset which he feels compounds the problem.  He states he has not had an erection since starting ADT/radiation.  He has been on ADT x18 months and is inquiring if this could be stopped.   Observations/Objective: Alert, conversive.  Answers questions appropriately  Assessment and Plan: -Persistent storage related voiding symptoms.  Failed anticholinergic and Myrbetriq.  He has undergone PTNS.  He would like to see Dr. Matilde Sprang to discuss nonmedical options.  -He has noted penile atrophy.  Sildenafil 20 mg daily  -We discussed ADT was recommended for 2 years.  He has undergone 18 months and would like to  discontinue.  PSA 12/2019 was <0.01.  Follow-up PSA July 2021   Follow Up Instructions: I discussed the assessment and treatment plan with the patient. The patient was provided an opportunity to ask questions and all were answered. The patient agreed with the plan and demonstrated an understanding of the instructions.   The patient was advised to call back or seek an in-person evaluation if the symptoms worsen or if the condition fails to improve as anticipated.  I provided 15 minutes of non-face-to-face time during this encounter.   Abbie Sons, MD

## 2020-02-18 MED ORDER — SILDENAFIL CITRATE 20 MG PO TABS: ORAL_TABLET | ORAL | 3 refills | Status: DC

## 2020-02-26 ENCOUNTER — Telehealth: Payer: Self-pay

## 2020-02-26 ENCOUNTER — Other Ambulatory Visit: Payer: Self-pay | Admitting: *Deleted

## 2020-02-26 MED ORDER — SILDENAFIL CITRATE 20 MG PO TABS: ORAL_TABLET | ORAL | 0 refills | Status: DC

## 2020-02-26 NOTE — Telephone Encounter (Signed)
[facility]

## 2020-02-26 NOTE — Telephone Encounter (Signed)
Pt calls and states that he received a letter from his insurance company saying that his prior auth for sildenafil was denied due to the office not sending notes or information. He is not taking sildenafil for erectile dysfunction, rather for urinary symptoms. Pt requests call back when PA completed.

## 2020-02-29 ENCOUNTER — Other Ambulatory Visit: Payer: Self-pay

## 2020-02-29 ENCOUNTER — Encounter: Payer: Self-pay | Admitting: Urology

## 2020-02-29 ENCOUNTER — Ambulatory Visit (INDEPENDENT_AMBULATORY_CARE_PROVIDER_SITE_OTHER): Payer: Medicare Other | Admitting: Urology

## 2020-02-29 VITALS — BP 115/70 | HR 84 | Ht 69.0 in | Wt 260.0 lb

## 2020-02-29 DIAGNOSIS — N3941 Urge incontinence: Secondary | ICD-10-CM

## 2020-02-29 DIAGNOSIS — N3281 Overactive bladder: Secondary | ICD-10-CM | POA: Diagnosis not present

## 2020-02-29 LAB — URINALYSIS, COMPLETE
Bilirubin, UA: NEGATIVE
Glucose, UA: NEGATIVE
Ketones, UA: NEGATIVE
Leukocytes,UA: NEGATIVE
Nitrite, UA: NEGATIVE
Specific Gravity, UA: 1.02 (ref 1.005–1.030)
Urobilinogen, Ur: 2 mg/dL — ABNORMAL HIGH (ref 0.2–1.0)
pH, UA: 6.5 (ref 5.0–7.5)

## 2020-02-29 LAB — MICROSCOPIC EXAMINATION: Bacteria, UA: NONE SEEN

## 2020-02-29 MED ORDER — SILDENAFIL CITRATE 20 MG PO TABS: ORAL_TABLET | ORAL | 3 refills | Status: AC

## 2020-02-29 NOTE — Progress Notes (Signed)
02/29/2020 9:21 AM   Jimmy Greer [birth year] [id number]  Referring provider: Idelle Crouch, MD [facility] [place],  [place] [address]  Chief Complaint  Patient presents with  . Over Active Bladder    HPI: Patient saw Dr. Bernardo Heater for lower tract symptoms.  Myrbetriq helped but was too expensive.  He is on Flomax and failed oxybutynin.  He minimally benefit from [place] and a dry mouth.  He had radiation for prostate cancer.  He had a normal cystoscopy recently.  He failed PTNS  Patient voids every 1-2 hours and gets up 4-6 times a night.  He does better when he does his CPAP.  He has ankle edema.  He takes a diuretic.  He has very sudden urgency and urge incontinence wearing 2 pads a day that are damp to moderately wet  He continues to be bothered by a retracted penis discussed with Dr. Bernardo Heater.  He will start to leak and he needs a urinal to decrease the spraying issue.  He understands the percutaneous tibial nerve stimulation done before 2019 when he had his radiation generally speaking cannot be repeated for insurance reasons  He would like to proceed with urodynamics.  At his request we gave him his Viagra prescription renewal.  We will be assessing his bladder capacity and his bladder overactivity and whether or not he has an element of bladder outlet obstruction.  He understands it theoretically sacral nerve stimulation and Botox are potential options.   PMH: Past Medical History:  Diagnosis Date  . Arthritis   . Bleeding disorder ([place])   . Cancer ([place])   . Diabetes mellitus without complication ([place])   . GERD (gastroesophageal reflux disease)   . Hypertension     Surgical History: Past Surgical History:  Procedure Laterality Date  . APPENDECTOMY    . CARPAL TUNNEL RELEASE    . COLONOSCOPY WITH PROPOFOL N/A 09/25/2017   Procedure: COLONOSCOPY WITH PROPOFOL;  Surgeon: Toledo, Benay Pike, MD;  Location: [facility] ENDOSCOPY;  Service:  Endoscopy;  Laterality: N/A;  . ESOPHAGOGASTRODUODENOSCOPY (EGD) WITH PROPOFOL N/A 09/25/2017   Procedure: ESOPHAGOGASTRODUODENOSCOPY (EGD) WITH PROPOFOL;  Surgeon: Toledo, Benay Pike, MD;  Location: [facility] ENDOSCOPY;  Service: Endoscopy;  Laterality: N/A;  . GALLBLADDER SURGERY    . REPLACEMENT TOTAL KNEE Bilateral     Home Medications:  Allergies as of 02/29/2020   No Known Allergies     Medication List       Accurate as of February 29, 2020  9:21 AM. If you have any questions, ask your nurse or doctor.        allopurinol 300 MG tablet Commonly known as: ZYLOPRIM TAKE 1 TABLET ONE TIME DAILY   aspirin EC 81 MG tablet Take by mouth.   gabapentin 600 MG tablet Commonly known as: NEURONTIN TAKE 1 TABLET THREE TIMES DAILY   hydrochlorothiazide 25 MG tablet Commonly known as: HYDRODIURIL Take by mouth.   Melatonin 5 MG Caps Take by mouth.   meloxicam 7.5 MG tablet Commonly known as: MOBIC Take 7.5 mg by mouth daily.   metFORMIN 1000 MG tablet Commonly known as: GLUCOPHAGE Take 1,000 mg by mouth 2 (two) times daily with a meal.   Multi-Vitamins Tabs Take by mouth.   omeprazole 40 MG capsule Commonly known as: PRILOSEC TAKE 1 CAPSULE TWICE DAILY   pioglitazone 15 MG tablet Commonly known as: ACTOS TAKE 1 TABLET ONE TIME DAILY   sildenafil 20 MG tablet Commonly known as: REVATIO  1 tablet daily   simvastatin 40 MG tablet Commonly known as: ZOCOR TAKE 1 TABLET EVERY NIGHT   tamsulosin 0.4 MG Caps capsule Commonly known as: FLOMAX TAKE 1 CAPSULE TWICE DAILY   traMADol 50 MG tablet Commonly known as: ULTRAM Take by mouth every 6 (six) hours as needed.   warfarin 5 MG tablet Commonly known as: COUMADIN Take by mouth. 2 days per week   warfarin 3 MG tablet Commonly known as: COUMADIN Take 3 mg by mouth. Five days per week       Allergies: No Known Allergies  Family History: Family History  Problem Relation Age of Onset  . Prostate cancer Brother     . Bladder Cancer Neg Hx   . Kidney cancer Neg Hx     Social History:  reports that he has quit smoking. He has never used smokeless tobacco. He reports current alcohol use. He reports that he does not use drugs.  ROS:                                        Physical Exam: There were no vitals taken for this visit.  Constitutional:  Alert and oriented, No acute distress.   Laboratory Data: Lab Results  Component Value Date   WBC 3.9 12/23/2012   HGB 9.0 (L) 01/07/2013   HCT 34.6 (L) 12/23/2012   MCV 83 12/23/2012   PLT 148 (L) 01/07/2013    Lab Results  Component Value Date   CREATININE 0.90 01/07/2013    No results found for: PSA  No results found for: TESTOSTERONE  No results found for: HGBA1C  Urinalysis    Component Value Date/Time   COLORURINE Yellow 12/23/2012 1403   APPEARANCEUR Clear 01/13/2020 1140   LABSPEC 1.010 12/23/2012 1403   PHURINE 5.0 12/23/2012 1403   GLUCOSEU Negative 01/13/2020 1140   GLUCOSEU Negative 12/23/2012 1403   HGBUR 1+ 12/23/2012 1403   BILIRUBINUR Negative 01/13/2020 1140   BILIRUBINUR Negative 12/23/2012 1403   KETONESUR Negative 12/23/2012 1403   PROTEINUR Negative 01/13/2020 1140   PROTEINUR Negative 12/23/2012 1403   NITRITE Negative 01/13/2020 1140   NITRITE Negative 12/23/2012 1403   LEUKOCYTESUR Negative 01/13/2020 1140   LEUKOCYTESUR Negative 12/23/2012 1403    Pertinent Imaging:   Assessment & Plan: Urodynamics ordered  1. OAB (overactive bladder)  - Urinalysis, Complete   No follow-ups on file.  Reece Packer, MD  [facility] [address] [place], [place] [address] [phone]

## 2020-03-22 ENCOUNTER — Ambulatory Visit: Payer: Medicare HMO

## 2020-04-21 ENCOUNTER — Other Ambulatory Visit: Payer: Self-pay | Admitting: Urology

## 2020-04-25 ENCOUNTER — Encounter: Payer: Self-pay | Admitting: Urology

## 2020-04-25 ENCOUNTER — Other Ambulatory Visit: Payer: Self-pay

## 2020-04-25 ENCOUNTER — Telehealth: Payer: Self-pay | Admitting: Urology

## 2020-04-25 ENCOUNTER — Ambulatory Visit (INDEPENDENT_AMBULATORY_CARE_PROVIDER_SITE_OTHER): Payer: Medicare Other | Admitting: Urology

## 2020-04-25 VITALS — BP 102/65 | HR 64 | Ht 69.0 in | Wt 262.0 lb

## 2020-04-25 DIAGNOSIS — N3946 Mixed incontinence: Secondary | ICD-10-CM

## 2020-04-25 NOTE — Progress Notes (Signed)
04/25/2020 11:24 AM   Jimmy Greer [birth year] [id number]  Referring provider: Idelle Crouch, MD [facility] [place],  [place] [address]  Chief Complaint  Patient presents with  . Results    UDS    HPI: Patient saw Dr. Bernardo Heater for lower tract symptoms.  Myrbetriq helped but was too expensive.  He is on Flomax and failed oxybutynin.  He minimally benefit from [place] and a dry mouth.  He had radiation for prostate cancer.  He had a normal cystoscopy recently.  He failed PTNS  Patient voids every 1-2 hours and gets up 4-6 times a night.  He does better when he does his CPAP.  He has ankle edema.  He takes a diuretic.  He has very sudden urgency and urge incontinence wearing 2 pads a day that are damp to moderately wet  He continues to be bothered by a retracted penis discussed with Dr. Bernardo Heater.  He will start to leak and he needs a urinal to decrease the spraying issue.  He understands the percutaneous tibial nerve stimulation done before 2019 when he had his radiation generally speaking cannot be repeated for insurance reasons  He would like to proceed with urodynamics.  At his request we gave him his Viagra prescription renewal.  We will be assessing his bladder capacity and his bladder overactivity and whether or not he has an element of bladder outlet obstruction.  He understands it theoretically sacral nerve stimulation and Botox are potential options.  Today Frequency stable.  Incontinence stable. Patient had voided prior to urodynamics and was catheterized for 30 mL.  Maximum bladder capacity 340 mL.  Bladder was unstable reaching a pressure of 12 cm of water.  He felt urgency.  He tried to void off the contraction.  He could not void.  The pressure reached 100 cm of water.  He leaned back and then he could void.  No stress incontinence with a Valsalva pressure 167 cm water.  He could voluntarily void.  He often voids with straining at the end  as noted.  He voided 168 mL with a maximum flow 6 mils per second.  Maximum voiding pressure 22 cm of water.  There was loss of line artifact.  Residual was 100 mL.  His flow was very intermittent with the voluntary contraction.  The details of the urodynamics are signed dictated   PMH: Past Medical History:  Diagnosis Date  . Arthritis   . Bleeding disorder ([place])   . Cancer ([place])   . Diabetes mellitus without complication ([place])   . GERD (gastroesophageal reflux disease)   . Hypertension     Surgical History: Past Surgical History:  Procedure Laterality Date  . APPENDECTOMY    . CARPAL TUNNEL RELEASE    . COLONOSCOPY WITH PROPOFOL N/A 09/25/2017   Procedure: COLONOSCOPY WITH PROPOFOL;  Surgeon: Toledo, Benay Pike, MD;  Location: [facility] ENDOSCOPY;  Service: Endoscopy;  Laterality: N/A;  . ESOPHAGOGASTRODUODENOSCOPY (EGD) WITH PROPOFOL N/A 09/25/2017   Procedure: ESOPHAGOGASTRODUODENOSCOPY (EGD) WITH PROPOFOL;  Surgeon: Toledo, Benay Pike, MD;  Location: [facility] ENDOSCOPY;  Service: Endoscopy;  Laterality: N/A;  . GALLBLADDER SURGERY    . REPLACEMENT TOTAL KNEE Bilateral     Home Medications:  Allergies as of 04/25/2020   No Known Allergies     Medication List       Accurate as of Apr 25, 2020 11:24 AM. If you have any questions, ask your nurse or doctor.  allopurinol 300 MG tablet Commonly known as: ZYLOPRIM TAKE 1 TABLET ONE TIME DAILY   aspirin EC 81 MG tablet Take by mouth.   gabapentin 600 MG tablet Commonly known as: NEURONTIN TAKE 1 TABLET THREE TIMES DAILY   hydrochlorothiazide 25 MG tablet Commonly known as: HYDRODIURIL Take by mouth.   Melatonin 5 MG Caps Take by mouth.   meloxicam 7.5 MG tablet Commonly known as: MOBIC Take 7.5 mg by mouth daily.   metFORMIN 1000 MG tablet Commonly known as: GLUCOPHAGE Take 1,000 mg by mouth 2 (two) times daily with a meal.   Multi-Vitamins Tabs Take by mouth.   omeprazole 40 MG capsule Commonly known as:  PRILOSEC TAKE 1 CAPSULE TWICE DAILY   pioglitazone 15 MG tablet Commonly known as: ACTOS TAKE 1 TABLET ONE TIME DAILY   sildenafil 20 MG tablet Commonly known as: REVATIO 1 tablet daily   simvastatin 40 MG tablet Commonly known as: ZOCOR TAKE 1 TABLET EVERY NIGHT   tamsulosin 0.4 MG Caps capsule Commonly known as: FLOMAX TAKE 1 CAPSULE TWICE DAILY   traMADol 50 MG tablet Commonly known as: ULTRAM Take by mouth every 6 (six) hours as needed.   warfarin 5 MG tablet Commonly known as: COUMADIN Take by mouth. 2 days per week   warfarin 3 MG tablet Commonly known as: COUMADIN Take 3 mg by mouth. Five days per week       Allergies: No Known Allergies  Family History: Family History  Problem Relation Age of Onset  . Prostate cancer Brother   . Bladder Cancer Neg Hx   . Kidney cancer Neg Hx     Social History:  reports that he has quit smoking. He has never used smokeless tobacco. He reports current alcohol use. He reports that he does not use drugs.  ROS:                                        Physical Exam: BP 102/65   Pulse 64   Ht 5\' 9"  (1.753 m)   Wt 262 lb (118.8 kg)   BMI 38.69 kg/m   Constitutional:  Alert and oriented, No acute distress.  Laboratory Data: Lab Results  Component Value Date   WBC 3.9 12/23/2012   HGB 9.0 (L) 01/07/2013   HCT 34.6 (L) 12/23/2012   MCV 83 12/23/2012   PLT 148 (L) 01/07/2013    Lab Results  Component Value Date   CREATININE 0.90 01/07/2013    No results found for: PSA  No results found for: TESTOSTERONE  No results found for: HGBA1C  Urinalysis    Component Value Date/Time   COLORURINE Yellow 12/23/2012 1403   APPEARANCEUR Clear 02/29/2020 0920   LABSPEC 1.010 12/23/2012 1403   PHURINE 5.0 12/23/2012 1403   GLUCOSEU Negative 02/29/2020 0920   GLUCOSEU Negative 12/23/2012 1403   HGBUR 1+ 12/23/2012 1403   BILIRUBINUR Negative 02/29/2020 0920   BILIRUBINUR Negative 12/23/2012  1403   KETONESUR Negative 12/23/2012 1403   PROTEINUR 1+ (A) 02/29/2020 0920   PROTEINUR Negative 12/23/2012 1403   NITRITE Negative 02/29/2020 0920   NITRITE Negative 12/23/2012 1403   LEUKOCYTESUR Negative 02/29/2020 0920   LEUKOCYTESUR Negative 12/23/2012 1403    Pertinent Imaging:  Higher retention rates and lower efficacy discussed with all treatments especially Botox post radiation. Assessment & Plan: Patient has medical comorbidities.  On blood thinners. Says he readily stops  blood thinners all the time.  I went over Botox and InterStim and full templates.  He will think about it and call us I would like to proceed with 1.  Handouts given.  Full template utilized.  Botox off label and perhaps more bleeding or retention issues with radiation discussed  There are no diagnoses linked to this encounter.  No follow-ups on file.  Reece Packer, MD  [facility] [address] [place], [place] [address] [phone]

## 2020-04-25 NOTE — Telephone Encounter (Signed)
Pt would like someone to call him to discuss the brochures he received this morning at his appt.  He is confused about the bladder pacemaker.

## 2020-04-26 NOTE — Telephone Encounter (Signed)
Patient information sent via mychart for interstim

## 2020-05-23 ENCOUNTER — Telehealth: Payer: Self-pay | Admitting: Radiology

## 2020-05-23 NOTE — Telephone Encounter (Signed)
Patient would like to have botox bladder injections. Please arrange.

## 2020-05-24 MED ORDER — CIPROFLOXACIN HCL 250 MG PO TABS: ORAL_TABLET | ORAL | 0 refills | Status: DC

## 2020-05-24 NOTE — Telephone Encounter (Signed)
No Pre-Auth required Ref# [id number] over procedure in detail. Scheduled lab visit and procedure. Prefers to have RX of Cipro printed-left up front for patient to pick up. Voiced understanding.

## 2020-05-24 NOTE — Addendum Note (Signed)
Addended by: Verlene Mayer A on: 05/24/2020 03:37 PM   Modules accepted: Orders

## 2020-05-26 NOTE — Telephone Encounter (Signed)
If he has botox make sure he is off all blood thinners Geniva Lohnes - cheers

## 2020-05-26 NOTE — Telephone Encounter (Signed)
Faxed Cardiac clearance to PCP, patient aware.

## 2020-05-27 ENCOUNTER — Other Ambulatory Visit: Payer: Self-pay | Admitting: Family Medicine

## 2020-05-27 DIAGNOSIS — N3281 Overactive bladder: Secondary | ICD-10-CM

## 2020-05-30 ENCOUNTER — Other Ambulatory Visit: Payer: Self-pay

## 2020-05-30 ENCOUNTER — Other Ambulatory Visit: Payer: Medicare Other

## 2020-05-30 DIAGNOSIS — N3281 Overactive bladder: Secondary | ICD-10-CM

## 2020-05-30 LAB — URINALYSIS, COMPLETE
Bilirubin, UA: NEGATIVE
Glucose, UA: NEGATIVE
Ketones, UA: NEGATIVE
Leukocytes,UA: NEGATIVE
Nitrite, UA: NEGATIVE
Protein,UA: NEGATIVE
Specific Gravity, UA: 1.025 (ref 1.005–1.030)
Urobilinogen, Ur: 0.2 mg/dL (ref 0.2–1.0)
pH, UA: 5 (ref 5.0–7.5)

## 2020-05-30 LAB — MICROSCOPIC EXAMINATION
Bacteria, UA: NONE SEEN
Epithelial Cells (non renal): NONE SEEN /hpf (ref 0–10)

## 2020-06-06 ENCOUNTER — Ambulatory Visit (INDEPENDENT_AMBULATORY_CARE_PROVIDER_SITE_OTHER): Payer: Medicare Other | Admitting: Urology

## 2020-06-06 ENCOUNTER — Other Ambulatory Visit: Payer: Self-pay

## 2020-06-06 ENCOUNTER — Encounter: Payer: Self-pay | Admitting: Urology

## 2020-06-06 VITALS — BP 114/67 | HR 63

## 2020-06-06 DIAGNOSIS — N3946 Mixed incontinence: Secondary | ICD-10-CM

## 2020-06-06 DIAGNOSIS — N3281 Overactive bladder: Secondary | ICD-10-CM | POA: Diagnosis not present

## 2020-06-06 MED ORDER — ONABOTULINUMTOXINA 100 UNITS IJ SOLR
100.0000 [IU] | Freq: Once | INTRAMUSCULAR | Status: AC
  Administered 2020-06-06: 100 [IU] via INTRAMUSCULAR

## 2020-06-06 MED ORDER — LIDOCAINE HCL 2 % IJ SOLN
60.0000 mL | Freq: Once | INTRAMUSCULAR | Status: AC
  Administered 2020-06-06: 1200 mg

## 2020-06-06 NOTE — Patient Instructions (Signed)
Start Coumadin tomorrow   Follow up in 2 weeks with PA  Finish your antibiotics

## 2020-06-06 NOTE — Progress Notes (Signed)
Bladder Instillation  Due to Botox patient is present today for a Bladder Instillation of Lidocaine 2%. Patient was cleaned and prepped in a sterile fashion with betadine and lidocaine 2% jelly was instilled into the urethra.  A 14FR catheter was inserted, urine return was noted 39ml, urine was yellow in color.  60 ml was instilled into the bladder. The catheter was then removed. Patient tolerated well, no complications were noted Patient held in bladder for 30 minutes prior to procedure starting.   Performed by: Verlene Mayer, [place]

## 2020-06-06 NOTE — Progress Notes (Signed)
06/06/2020 2:35 PM   Jimmy Greer August 05, 1941 [id number]  Referring provider: Idelle Crouch, MD [place] [facility] [place],  [place] [address]  Chief Complaint  Patient presents with  . Botulinum Toxin Injection    HPI: Patient saw Dr. Bernardo Heater for lower tract symptoms. Myrbetriq helped but was too expensive. He is on Flomax and failed oxybutynin.He minimally benefit from [place] and a dry mouth. He had radiation for prostate cancer. He had a normal cystoscopy recently.He failed PTNS  Patient voids every 1-2 hours and gets up 4-6 times a night. He does better when he does his CPAP. He has ankle edema. He takes a diuretic. He has very sudden urgency and urge incontinence wearing 2 pads a day that are damp to moderately wet  He continues to be bothered by a retracted penis discussed with Dr. Bernardo Heater. He will start to leak and he needs a urinal to decrease the spraying issue.  He understands the percutaneous tibial nerve stimulation done before 2019 when he had his radiation generally speaking cannot be repeated for insurance reasons  He would like to proceed with urodynamics. At his request we gave him his Viagra prescription renewal. We will be assessing his bladder capacity and his bladder overactivity and whether or not he has an element of bladder outlet obstruction. He understands it theoretically sacral nerve stimulation and Botox are potential options.  Patient had voided prior to urodynamics and was catheterized for 30 mL.  Maximum bladder capacity 340 mL.  Bladder was unstable reaching a pressure of 12 cm of water.  He felt urgency.  He tried to void off the contraction.  He could not void.  The pressure reached 100 cm of water.  He leaned back and then he could void.  No stress incontinence with a Valsalva pressure 167 cm water.  He could voluntarily void.  He often voids with straining at the end as noted.  He voided 168 mL with a  maximum flow 6 mils per second.  Maximum voiding pressure 22 cm of water.  There was loss of line artifact.  Residual was 100 mL.  His flow was very intermittent with the voluntary contraction.    Higher retention rates and lower efficacy discussed with all treatments especially Botox post radiation. Patient has medical comorbidities.  On blood thinners. Says he readily stops blood thinners all the time.  I went over Botox and InterStim and full templates.  He will think about it and call us I would like to proceed with 1.  Handouts given.  Full template utilized.  Botox off label and perhaps more bleeding or retention issues with radiation discussed  Today Frequency stable.  Incontinence stable.  Took antibiotics and stopped his Coumadin Patient underwent flexible cystoscopy Cystoscopy and Botox: Penile bulbar membranous urethra normal.  He a grade 1 of 4 bladder trabeculation.  No cystitis.  Trigone easily visualized.  I injected 100 units of Botox in 10 cc of normal saline with my usual template in the lower half of the bladder.  He tolerated it very well.  No bleeding.  Usual protocol will be followed.  Start Coumadin tomorrow unless urine grossly bloody.   PMH: Past Medical History:  Diagnosis Date  . Arthritis   . Bleeding disorder ([place])   . Cancer ([place])   . Diabetes mellitus without complication ([place])   . GERD (gastroesophageal reflux disease)   . Hypertension     Surgical History: Past Surgical History:  Procedure Laterality Date  . APPENDECTOMY    . CARPAL TUNNEL RELEASE    . COLONOSCOPY WITH PROPOFOL N/A 09/25/2017   Procedure: COLONOSCOPY WITH PROPOFOL;  Surgeon: Toledo, Benay Pike, MD;  Location: [facility] ENDOSCOPY;  Service: Endoscopy;  Laterality: N/A;  . ESOPHAGOGASTRODUODENOSCOPY (EGD) WITH PROPOFOL N/A 09/25/2017   Procedure: ESOPHAGOGASTRODUODENOSCOPY (EGD) WITH PROPOFOL;  Surgeon: Toledo, Benay Pike, MD;  Location: [facility] ENDOSCOPY;  Service: Endoscopy;  Laterality: N/A;    . GALLBLADDER SURGERY    . REPLACEMENT TOTAL KNEE Bilateral     Home Medications:  Allergies as of 06/06/2020   No Known Allergies     Medication List       Accurate as of June 06, 2020  2:35 PM. If you have any questions, ask your nurse or doctor.        allopurinol 300 MG tablet Commonly known as: ZYLOPRIM TAKE 1 TABLET ONE TIME DAILY   aspirin EC 81 MG tablet Take by mouth.   ciprofloxacin 250 MG tablet Commonly known as: Cipro Take 1 tablet two times a day before procedure, day of and day after.   gabapentin 600 MG tablet Commonly known as: NEURONTIN TAKE 1 TABLET THREE TIMES DAILY   hydrochlorothiazide 25 MG tablet Commonly known as: HYDRODIURIL Take by mouth.   Melatonin 5 MG Caps Take by mouth.   meloxicam 7.5 MG tablet Commonly known as: MOBIC Take 7.5 mg by mouth daily.   metFORMIN 1000 MG tablet Commonly known as: GLUCOPHAGE Take 1,000 mg by mouth 2 (two) times daily with a meal.   Multi-Vitamins Tabs Take by mouth.   omeprazole 40 MG capsule Commonly known as: PRILOSEC TAKE 1 CAPSULE TWICE DAILY   pioglitazone 15 MG tablet Commonly known as: ACTOS TAKE 1 TABLET ONE TIME DAILY   sildenafil 20 MG tablet Commonly known as: REVATIO 1 tablet daily   simvastatin 40 MG tablet Commonly known as: ZOCOR TAKE 1 TABLET EVERY NIGHT   tamsulosin 0.4 MG Caps capsule Commonly known as: FLOMAX TAKE 1 CAPSULE TWICE DAILY   traMADol 50 MG tablet Commonly known as: ULTRAM Take by mouth every 6 (six) hours as needed.   warfarin 5 MG tablet Commonly known as: COUMADIN Take by mouth. 2 days per week   warfarin 3 MG tablet Commonly known as: COUMADIN Take 3 mg by mouth. Five days per week       Allergies: No Known Allergies  Family History: Family History  Problem Relation Age of Onset  . Prostate cancer Brother   . Bladder Cancer Neg Hx   . Kidney cancer Neg Hx     Social History:  reports that he has quit smoking. He has never used  smokeless tobacco. He reports current alcohol use. He reports that he does not use drugs.  ROS:                                        Physical Exam: There were no vitals taken for this visit.  Constitutional:  Alert and oriented, No acute distress.   Laboratory Data: Lab Results  Component Value Date   WBC 3.9 12/23/2012   HGB 9.0 (L) 01/07/2013   HCT 34.6 (L) 12/23/2012   MCV 83 12/23/2012   PLT 148 (L) 01/07/2013    Lab Results  Component Value Date   CREATININE 0.90 01/07/2013    No results found for: PSA  No results  found for: TESTOSTERONE  No results found for: HGBA1C  Urinalysis    Component Value Date/Time   COLORURINE Yellow 12/23/2012 1403   APPEARANCEUR Clear 05/30/2020 1321   LABSPEC 1.010 12/23/2012 1403   PHURINE 5.0 12/23/2012 1403   GLUCOSEU Negative 05/30/2020 1321   GLUCOSEU Negative 12/23/2012 1403   HGBUR 1+ 12/23/2012 1403   BILIRUBINUR Negative 05/30/2020 1321   BILIRUBINUR Negative 12/23/2012 1403   KETONESUR Negative 12/23/2012 1403   PROTEINUR Negative 05/30/2020 1321   PROTEINUR Negative 12/23/2012 1403   NITRITE Negative 05/30/2020 1321   NITRITE Negative 12/23/2012 1403   LEUKOCYTESUR Negative 05/30/2020 1321   LEUKOCYTESUR Negative 12/23/2012 1403    Pertinent Imaging:   Assessment & Plan: Follow-up as scheduled  1. OAB (overactive bladder)  - Urinalysis, Complete   No follow-ups on file.  Reece Packer, MD  [facility] [address] [place], [place] [address] [phone]

## 2020-06-07 ENCOUNTER — Telehealth: Payer: Self-pay | Admitting: *Deleted

## 2020-06-07 NOTE — Telephone Encounter (Signed)
Spoke with patient post Botox-denies pain, trouble urinating-aware of follow up appointment.

## 2020-06-08 ENCOUNTER — Telehealth: Payer: Self-pay | Admitting: Urology

## 2020-06-08 LAB — URINALYSIS, COMPLETE
Bilirubin, UA: NEGATIVE
Glucose, UA: NEGATIVE
Ketones, UA: NEGATIVE
Leukocytes,UA: NEGATIVE
Nitrite, UA: NEGATIVE
Protein,UA: NEGATIVE
Specific Gravity, UA: 1.025 (ref 1.005–1.030)
Urobilinogen, Ur: 0.2 mg/dL (ref 0.2–1.0)
pH, UA: 5 (ref 5.0–7.5)

## 2020-06-08 LAB — MICROSCOPIC EXAMINATION
Bacteria, UA: NONE SEEN
Epithelial Cells (non renal): NONE SEEN /hpf (ref 0–10)

## 2020-06-08 NOTE — Telephone Encounter (Signed)
PT left vm he received a call someone was checking on him he did forget to ask him a question please call pt

## 2020-06-08 NOTE — Telephone Encounter (Signed)
Answered all questions regarding his Botox procedure=denies issues. Voiced understanding.

## 2020-06-21 ENCOUNTER — Other Ambulatory Visit: Payer: Self-pay

## 2020-06-21 ENCOUNTER — Other Ambulatory Visit: Payer: Self-pay | Admitting: Family Medicine

## 2020-06-21 ENCOUNTER — Ambulatory Visit (INDEPENDENT_AMBULATORY_CARE_PROVIDER_SITE_OTHER): Payer: Medicare Other | Admitting: Physician Assistant

## 2020-06-21 ENCOUNTER — Other Ambulatory Visit: Payer: Medicare Other

## 2020-06-21 ENCOUNTER — Encounter: Payer: Self-pay | Admitting: Physician Assistant

## 2020-06-21 VITALS — BP 131/74 | HR 80 | Ht 69.0 in | Wt 265.7 lb

## 2020-06-21 DIAGNOSIS — C61 Malignant neoplasm of prostate: Secondary | ICD-10-CM

## 2020-06-21 DIAGNOSIS — N3281 Overactive bladder: Secondary | ICD-10-CM | POA: Diagnosis not present

## 2020-06-21 LAB — BLADDER SCAN AMB NON-IMAGING

## 2020-06-21 NOTE — Progress Notes (Signed)
06/21/2020 4:01 PM   Jimmy Greer 08-05-1941 [id number]  CC: Chief Complaint  Patient presents with  . Follow-up    HPI: Jimmy Greer is a 79 y.o. male with PMH refractory OAB, sleep apnea, and prostate cancer s/p radiation therapy who presents today for PVR s/p intravesical Botox injections with Dr. Matilde Sprang on 06/06/2020.  Today, patient reports doing well since his Botox injections.  He is now urinating approximately every 2 hours, previously every 60 to 90 minutes.  He feels that he has greater urinary control compared to prior.  He feels he is emptying appropriately.  He continues to report bothersome nocturia.  He has not been wearing his CPAP regularly.  PVR 59mL.  PMH: Past Medical History:  Diagnosis Date  . Arthritis   . Bleeding disorder ([place])   . Cancer ([place])   . Diabetes mellitus without complication ([place])   . GERD (gastroesophageal reflux disease)   . Hypertension     Surgical History: Past Surgical History:  Procedure Laterality Date  . APPENDECTOMY    . CARPAL TUNNEL RELEASE    . COLONOSCOPY WITH PROPOFOL N/A 09/25/2017   Procedure: COLONOSCOPY WITH PROPOFOL;  Surgeon: Toledo, Benay Pike, MD;  Location: [facility] ENDOSCOPY;  Service: Endoscopy;  Laterality: N/A;  . ESOPHAGOGASTRODUODENOSCOPY (EGD) WITH PROPOFOL N/A 09/25/2017   Procedure: ESOPHAGOGASTRODUODENOSCOPY (EGD) WITH PROPOFOL;  Surgeon: Toledo, Benay Pike, MD;  Location: [facility] ENDOSCOPY;  Service: Endoscopy;  Laterality: N/A;  . GALLBLADDER SURGERY    . REPLACEMENT TOTAL KNEE Bilateral     Home Medications:  Allergies as of 06/21/2020   No Known Allergies     Medication List       Accurate as of June 21, 2020  4:01 PM. If you have any questions, ask your nurse or doctor.        allopurinol 300 MG tablet Commonly known as: ZYLOPRIM TAKE 1 TABLET ONE TIME DAILY   aspirin EC 81 MG tablet Take by mouth.   ciprofloxacin 250 MG tablet Commonly known as: Cipro Take 1 tablet two times  a day before procedure, day of and day after.   gabapentin 600 MG tablet Commonly known as: NEURONTIN TAKE 1 TABLET THREE TIMES DAILY   hydrochlorothiazide 25 MG tablet Commonly known as: HYDRODIURIL Take by mouth.   Melatonin 5 MG Caps Take by mouth.   meloxicam 7.5 MG tablet Commonly known as: MOBIC Take 7.5 mg by mouth daily.   metFORMIN 1000 MG tablet Commonly known as: GLUCOPHAGE Take 1,000 mg by mouth 2 (two) times daily with a meal.   Multi-Vitamins Tabs Take by mouth.   omeprazole 40 MG capsule Commonly known as: PRILOSEC TAKE 1 CAPSULE TWICE DAILY   pioglitazone 15 MG tablet Commonly known as: ACTOS TAKE 1 TABLET ONE TIME DAILY   sildenafil 20 MG tablet Commonly known as: REVATIO 1 tablet daily   simvastatin 40 MG tablet Commonly known as: ZOCOR TAKE 1 TABLET EVERY NIGHT   tamsulosin 0.4 MG Caps capsule Commonly known as: FLOMAX TAKE 1 CAPSULE TWICE DAILY   traMADol 50 MG tablet Commonly known as: ULTRAM Take by mouth every 6 (six) hours as needed.   warfarin 5 MG tablet Commonly known as: COUMADIN Take by mouth. 2 days per week   warfarin 3 MG tablet Commonly known as: COUMADIN Take 3 mg by mouth. Five days per week       Allergies:  No Known Allergies  Family History: Family History  Problem Relation Age of Onset  .  Prostate cancer Brother   . Bladder Cancer Neg Hx   . Kidney cancer Neg Hx     Social History:   reports that he has quit smoking. He has never used smokeless tobacco. He reports current alcohol use. He reports that he does not use drugs.  Physical Exam: BP 131/74 (BP Location: Left Arm, Patient Position: Sitting, Cuff Size: Large)   Pulse 80   Ht 5\' 9"  (1.753 m)   Wt 265 lb 11.2 oz (120.5 kg)   BMI 39.24 kg/m   Constitutional:  Alert and oriented, no acute distress, nontoxic appearing HEENT: [place], AT Cardiovascular: No clubbing, cyanosis, or edema Respiratory: Normal respiratory effort, no increased work of  breathing Skin: No rashes, bruises or suspicious lesions Neurologic: Grossly intact, no focal deficits, moving all 4 extremities Psychiatric: Normal mood and affect  Laboratory Data: Results for orders placed or performed in visit on 06/21/20  Bladder Scan (Post Void Residual) in office  Result Value Ref Range   Scan Result 57mL    Assessment & Plan:   1. OAB (overactive bladder) Patient beginning to see symptomatic improvement 2 weeks following intravesical Botox injections with decreased urinary frequency and increased urinary control.  PVR WNL.  Explained he may continue to see symptomatic improvement in the coming weeks.  Counseled him to contact the office if he develops a sensation of incomplete voiding or the inability to urinate.  He expressed understanding.  I reiterated that intravesical Botox typically last 4 to 6 months and that we can retreat when his symptomatic improvement wears off.   Encouraged him to continue CPAP use and explained that sleep apnea is likely contributing to his nighttime symptoms. - Bladder Scan (Post Void Residual) in office  2. Prostate cancer [facility]) PSA drawn today in advance of scheduled follow-up with Dr. Bernardo Heater next week. - PSA   Return in about 4 months (around 10/22/2020) for Symptom recheck with Dr. Matilde Sprang.  Debroah Loop, PA-C  [facility] [address], [place], [place] [address] [phone]

## 2020-06-22 LAB — PSA: Prostate Specific Ag, Serum: <0.1 ng/mL (ref 0.0–4.0)

## 2020-06-23 ENCOUNTER — Other Ambulatory Visit: Payer: Medicare Other

## 2020-06-27 ENCOUNTER — Ambulatory Visit: Payer: Medicare Other | Admitting: Urology

## 2020-06-29 ENCOUNTER — Other Ambulatory Visit: Payer: Self-pay

## 2020-06-29 ENCOUNTER — Ambulatory Visit (INDEPENDENT_AMBULATORY_CARE_PROVIDER_SITE_OTHER): Payer: Medicare Other | Admitting: Urology

## 2020-06-29 ENCOUNTER — Encounter: Payer: Self-pay | Admitting: Urology

## 2020-06-29 VITALS — BP 125/70 | HR 73 | Ht 70.0 in | Wt 265.0 lb

## 2020-06-29 DIAGNOSIS — C61 Malignant neoplasm of prostate: Secondary | ICD-10-CM | POA: Diagnosis not present

## 2020-06-29 DIAGNOSIS — N3281 Overactive bladder: Secondary | ICD-10-CM

## 2020-06-29 NOTE — Progress Notes (Signed)
05/17/20 2:18 PM   Jimmy Greer November 09, 1941 [id number]  Referring provider: Idelle Crouch, MD [facility] [place],  [place] [address] Chief Complaint  Patient presents with   Prostate Cancer    Urologic history: 1. T2 high risk prostate cancer -Diagnosed 07/2018; PSA 8.6; 48 cc gland -IMRT plus ADT as primary treatment -IMRT completed December 2019   HPI: Jimmy Greer is a 79 y.o. male who presents today for a prostate cancer follow up.  -Has recently seen Dr. Matilde Sprang for [facility] bladder symptoms after radiation -Underwent botox in 05/2020 -Overall does feel his overactive symptoms have lessened -Did have two episodes of nocturnal enuresis in the last few weeks, with the last episode one week ago -PSA drawn 05/2020 was undetectable at < 0.1   PMH: Past Medical History:  Diagnosis Date   Arthritis    Bleeding disorder ([place])    Cancer ([place])    Diabetes mellitus without complication ([place])    GERD (gastroesophageal reflux disease)    Hypertension     Surgical History: Past Surgical History:  Procedure Laterality Date   APPENDECTOMY     CARPAL TUNNEL RELEASE     COLONOSCOPY WITH PROPOFOL N/A 09/25/2017   Procedure: COLONOSCOPY WITH PROPOFOL;  Surgeon: Toledo, Benay Pike, MD;  Location: [facility] ENDOSCOPY;  Service: Endoscopy;  Laterality: N/A;   ESOPHAGOGASTRODUODENOSCOPY (EGD) WITH PROPOFOL N/A 09/25/2017   Procedure: ESOPHAGOGASTRODUODENOSCOPY (EGD) WITH PROPOFOL;  Surgeon: Toledo, Benay Pike, MD;  Location: [facility] ENDOSCOPY;  Service: Endoscopy;  Laterality: N/A;   GALLBLADDER SURGERY     REPLACEMENT TOTAL KNEE Bilateral     Home Medications:  Allergies as of 06/29/2020   No Known Allergies     Medication List       Accurate as of June 29, 2020  2:18 PM. If you have any questions, ask your nurse or doctor.        allopurinol 300 MG tablet Commonly known as: ZYLOPRIM TAKE 1 TABLET ONE TIME DAILY   aspirin  EC 81 MG tablet Take by mouth.   ciprofloxacin 250 MG tablet Commonly known as: Cipro Take 1 tablet two times a day before procedure, day of and day after.   gabapentin 600 MG tablet Commonly known as: NEURONTIN TAKE 1 TABLET THREE TIMES DAILY   hydrochlorothiazide 25 MG tablet Commonly known as: HYDRODIURIL Take by mouth.   Melatonin 5 MG Caps Take by mouth.   meloxicam 7.5 MG tablet Commonly known as: MOBIC Take 7.5 mg by mouth daily.   metFORMIN 1000 MG tablet Commonly known as: GLUCOPHAGE Take 1,000 mg by mouth 2 (two) times daily with a meal.   Multi-Vitamins Tabs Take by mouth.   omeprazole 40 MG capsule Commonly known as: PRILOSEC TAKE 1 CAPSULE TWICE DAILY   pioglitazone 15 MG tablet Commonly known as: ACTOS TAKE 1 TABLET ONE TIME DAILY   sildenafil 20 MG tablet Commonly known as: REVATIO 1 tablet daily   simvastatin 40 MG tablet Commonly known as: ZOCOR TAKE 1 TABLET EVERY NIGHT   tamsulosin 0.4 MG Caps capsule Commonly known as: FLOMAX TAKE 1 CAPSULE TWICE DAILY   traMADol 50 MG tablet Commonly known as: ULTRAM Take by mouth every 6 (six) hours as needed.   warfarin 5 MG tablet Commonly known as: COUMADIN Take by mouth. 2 days per week   warfarin 3 MG tablet Commonly known as: COUMADIN Take 3 mg by mouth. Five days per week       Allergies:  No Known Allergies  Family History: Family History  Problem Relation Age of Onset   Prostate cancer Brother    Bladder Cancer Neg Hx    Kidney cancer Neg Hx     Social History:  reports that he has quit smoking. He has never used smokeless tobacco. He reports current alcohol use. He reports that he does not use drugs.   Physical Exam: BP 125/70    Pulse 73    Ht 5\' 10"  (1.778 m)    Wt 265 lb (120.2 kg)    BMI 38.02 kg/m   Constitutional:  Alert and oriented, No acute distress. HEENT: [place] AT, moist mucus membranes.  Trachea midline, no masses. Cardiovascular: No clubbing, cyanosis, or  edema. Respiratory: Normal respiratory effort, no increased work of breathing. Skin: No rashes, bruises or suspicious lesions. Neurologic: Grossly intact, no focal deficits, moving all 4 extremities. Psychiatric: Normal mood and affect.   Assessment & Plan:    1. Prostate cancer status post radiation -PSA undetectable  -He has radiation oncology follow up in 12/2020 and will see him in 1 year   2. Overactive Bladder  -He has a follow up scheduled with Dr. Matilde Sprang in 10/2020   [facility] [address] [place], [place] [address] [phone]  I, Joneen Boers Peace, am acting as a Education administrator for Dr. Nicki Reaper C. Tashanti Dalporto.  I have reviewed the above documentation for accuracy and completeness, and I agree with the above.   Abbie Sons, MD

## 2020-06-30 ENCOUNTER — Encounter: Payer: Self-pay | Admitting: Urology

## 2020-06-30 DIAGNOSIS — N3281 Overactive bladder: Secondary | ICD-10-CM | POA: Insufficient documentation

## 2020-07-04 ENCOUNTER — Other Ambulatory Visit: Payer: Self-pay

## 2020-07-11 ENCOUNTER — Other Ambulatory Visit: Payer: Self-pay | Admitting: Urology

## 2020-09-07 ENCOUNTER — Ambulatory Visit
Admission: RE | Admit: 2020-09-07 | Discharge: 2020-09-07 | Disposition: A | Discharge status: Home / Self Care | Payer: Medicare Other | Source: Ambulatory Visit | Attending: Pulmonary Disease | Admitting: Pulmonary Disease

## 2020-09-07 ENCOUNTER — Other Ambulatory Visit: Payer: Self-pay | Admitting: Pulmonary Disease

## 2020-09-07 ENCOUNTER — Other Ambulatory Visit: Payer: Self-pay

## 2020-09-07 ENCOUNTER — Other Ambulatory Visit (HOSPITAL_COMMUNITY): Payer: Self-pay | Admitting: Pulmonary Disease

## 2020-09-07 DIAGNOSIS — R0602 Shortness of breath: Secondary | ICD-10-CM | POA: Insufficient documentation

## 2020-09-07 MED ORDER — IOHEXOL 350 MG/ML SOLN
85.0000 mL | Freq: Once | INTRAVENOUS | Status: AC | PRN
  Administered 2020-09-07: 85 mL via INTRAVENOUS

## 2020-10-05 DIAGNOSIS — I499 Cardiac arrhythmia, unspecified: Secondary | ICD-10-CM | POA: Insufficient documentation

## 2020-10-05 DIAGNOSIS — R079 Chest pain, unspecified: Secondary | ICD-10-CM | POA: Insufficient documentation

## 2020-10-05 DIAGNOSIS — R0602 Shortness of breath: Secondary | ICD-10-CM | POA: Insufficient documentation

## 2020-10-07 ENCOUNTER — Other Ambulatory Visit: Payer: Self-pay

## 2020-10-07 ENCOUNTER — Ambulatory Visit (INDEPENDENT_AMBULATORY_CARE_PROVIDER_SITE_OTHER): Payer: Medicare Other | Admitting: Vascular Surgery

## 2020-10-07 VITALS — BP 99/65 | HR 65 | Ht 69.0 in | Wt 256.0 lb

## 2020-10-07 DIAGNOSIS — I2699 Other pulmonary embolism without acute cor pulmonale: Secondary | ICD-10-CM | POA: Diagnosis not present

## 2020-10-07 DIAGNOSIS — I712 Thoracic aortic aneurysm, without rupture, unspecified: Secondary | ICD-10-CM

## 2020-10-07 DIAGNOSIS — I10 Essential (primary) hypertension: Secondary | ICD-10-CM

## 2020-10-07 DIAGNOSIS — I739 Peripheral vascular disease, unspecified: Secondary | ICD-10-CM

## 2020-10-07 DIAGNOSIS — E118 Type 2 diabetes mellitus with unspecified complications: Secondary | ICD-10-CM | POA: Diagnosis not present

## 2020-10-07 NOTE — Patient Instructions (Signed)
Thoracic Aortic Aneurysm  An aneurysm is a bulge in an artery. It happens when blood pushes up against a weakened or damaged artery wall. A thoracic aortic aneurysm is an aneurysm that occurs in the first part of the aorta, between the heart and the diaphragm. The aorta is the main artery of the body. It supplies blood from the heart to the rest of the body. Some aneurysms may not cause symptoms or problems. However, a thoracic aortic aneurysm can cause two serious problems:  It can enlarge and burst (rupture).  It can cause blood to flow between the layers of the wall of the aorta through a tear (aortic dissection). Both of these problems are medical emergencies. They can cause bleeding inside the body and can be life-threatening if they are not diagnosed and treated right away. What are the causes? The exact cause of this condition is not known. What increases the risk? The following factors may make you more likely to develop this condition:  Being 65 years of age or older.  Having a family history of aneurysms.  Using tobacco.  Having any of these conditions: ? Hardening of the arteries caused by the buildup of fat and other substances in the lining of a blood vessel (arteriosclerosis). ? Inflammation of the walls of an artery (arteritis). ? A genetic disease that weakens the body's connective tissue, such as Marfan syndrome. ? An injury or trauma to the aorta. ? High blood pressure (hypertension). ? High cholesterol. ? An infection from bacteria, such as syphilis or staphylococcus, in the wall of the aorta (infectious aortitis). What are the signs or symptoms? Symptoms of this condition vary depending on the size of the aneurysm and how fast it is growing. Most grow slowly and do not cause symptoms. When symptoms do occur, they may include:  Pain in the chest, back, sides, or abdomen. The pain may vary in intensity. Sudden, severe pain may indicate that the aneurysm has ruptured.   Hoarseness.  Cough.  Shortness of breath.  Swallowing problems.  Swelling in the face, arms, or legs.  Fever.  Unexplained weight loss. How is this diagnosed? This condition may be diagnosed with:  An ultrasound.  X-rays.  CT scan.  MRI.  A test to check the arteries for damage or blockages (angiogram). Most unruptured thoracic aortic aneurysms cause no symptoms, so they are often found during exams for other conditions. How is this treated? Treatment for this condition depends on:  The size of the aneurysm.  How fast the aneurysm is growing.  Your age.  Risk factors for rupture. Small aneurysms (2.2 inches, or 5.5 cm, or less) may be managed with:  Medicines to: ? Control blood pressure. ? Manage pain. ? Fight infection.  Regular monitoring. This may include an ultrasound or CT scan every year or every 6 months to see if the aneurysm is getting bigger. Large or fast-growing aneurysms may be treated with surgery. Follow these instructions at home: Eating and drinking   Eat a healthy diet. Your health care provider may recommend that you: ? Lower your salt (sodium) intake. In some people, too much salt can raise blood pressure and increase the risk for thoracic aortic aneurysm. ? Avoid foods that are high in saturated fat and cholesterol, such as red meat and full-fat dairy. ? Eat a diet that is low in sugar. ? Increase your fiber intake by including whole grains, vegetables, and fruits in your diet. Eating these foods may help to lower your   blood pressure.  Do not drink alcohol if your health care provider tells you not to drink.  If you drink alcohol: ? Limit how much you use to:  0-1 drink a day for women.  0-2 drinks a day for men. ? Be aware of how much alcohol is in your drink. In the [place] one drink equals one 12 oz bottle of beer (355 mL), one 5 oz glass of wine (148 mL), or one 1 oz glass of hard liquor (44 mL). Lifestyle  Do not use any  products that contain nicotine or tobacco, such as cigarettes, e-cigarettes, and chewing tobacco. If you need help quitting, ask your health care provider.  Maintain a healthy weight.  Check your blood pressure regularly. Follow your health care provider's instructions on how to keep your blood pressure within normal limits.  Have your blood sugar (glucose) level and cholesterol levels checked regularly. Follow your health care provider's instructions on how to keep levels within normal limits. Activity   Stay physically active and exercise regularly. Talk with your health care provider about how often you should exercise and ask which types of exercise are best for you.  Avoid heavy lifting and activities that take a lot of effort. Ask your health care provider what activities are safe for you. General instructions  Take over-the-counter and prescription medicines only as told by your health care provider.  Talk with your health care provider about regular screenings to see if the aneurysm is getting bigger.  Keep all follow-up visits as told by your health care provider. This is important. Contact a health care provider if you have:  Unexplained weight loss. Get help right away if you have:  Pain in your upper back, neck, or abdomen. This pain may move into your chest and arms.  Trouble swallowing.  A cough or hoarseness.  Shortness of breath. Summary  A thoracic aortic aneurysm is an aneurysm that occurs in the first part of the aorta, between the heart and the diaphragm.  As a thoracic aortic aneurysm becomes larger, it can burst (rupture), or blood can flow between the layers of the wall of the aorta through a tear (aorticdissection). These conditions can be life-threatening if they are not diagnosed and treated right away.  If you have a thoracic aortic aneurysm, its growth will be closely monitored. Surgical repair may be needed for larger or faster-growing aneurysms.  This information is not intended to replace advice given to you by your health care provider. Make sure you discuss any questions you have with your health care provider. Document Revised: 07/22/2018 Document Reviewed: 07/23/2018 Elsevier Patient Education  2020 Elsevier Inc.  

## 2020-10-07 NOTE — Assessment & Plan Note (Signed)
Remote.  Remains on anticoagulation.  No recent PE on the CT scan that I reviewed

## 2020-10-07 NOTE — Assessment & Plan Note (Signed)
blood pressure control important in reducing the progression of atherosclerotic disease and aneurysmal growth. On appropriate oral medications.  

## 2020-10-07 NOTE — Assessment & Plan Note (Signed)
Has not been checked in about 3-1/2 years at this point.  It was fairly mild and he does not have limb threatening symptoms, but certainly would like to get him in next couple weeks at his convenience to check his ABIs.

## 2020-10-07 NOTE — Assessment & Plan Note (Signed)
blood glucose control important in reducing the progression of atherosclerotic disease. Also, involved in wound healing. On appropriate medications.  

## 2020-10-07 NOTE — Assessment & Plan Note (Signed)
The patient has a 4.3 to 4.4 cm ascending thoracic aortic aneurysm.  We discussed the pathophysiology and natural history of thoracic aortic aneurysms today.  This is well below the threshold size for prophylactic repair, but it should be monitored.  We will plan a CT scan in 1 year for further evaluation.  Blood pressure control is the most important factor for avoiding growth.

## 2020-10-07 NOTE — Progress Notes (Signed)
Patient ID: Jimmy Greer, male   DOB: 08-09-1941, 79 y.o.   MRN: [id number]  No chief complaint on file.   HPI Jimmy Greer is a 79 y.o. male.  I am asked to see the patient by Dr. Montel Culver for evaluation of a thoracic aortic aneurysm.  He had a CT scan done for pulmonary reasons about a month ago which I have independently reviewed.  An incidental finding on the CT scan was of a 4.3 to 4.4 cm ascending thoracic aorta which would fall in the range of a small aneurysm.  No current aneurysm related symptoms.  Specifically, the patient denies new back or abdominal pain, or signs of peripheral embolization.  The patient does have a previous history of DVT and pulmonary embolus.  He has been on anticoagulation for many years for this.  No current PE was seen on his CT scan.  He has chronic right leg swelling which is actually a little better after he lost some weight although it remains noticeable. The patient also has a history of mild peripheral arterial disease.  We actually saw him for this about 3-1/2 years ago, but with the COVID-19 pandemic he was lost to follow-up last year at his scheduled follow-up visit.  He does not describe any disabling claudication symptoms, ischemic rest pain, or tissue loss.    Past Medical History:  Diagnosis Date  . Arthritis   . Bleeding disorder ([place])   . Cancer ([place])   . Diabetes mellitus without complication ([place])   . GERD (gastroesophageal reflux disease)   . Hypertension     Past Surgical History:  Procedure Laterality Date  . APPENDECTOMY    . CARPAL TUNNEL RELEASE    . COLONOSCOPY WITH PROPOFOL N/A 09/25/2017   Procedure: COLONOSCOPY WITH PROPOFOL;  Surgeon: Toledo, Benay Pike, MD;  Location: [facility] ENDOSCOPY;  Service: Endoscopy;  Laterality: N/A;  . ESOPHAGOGASTRODUODENOSCOPY (EGD) WITH PROPOFOL N/A 09/25/2017   Procedure: ESOPHAGOGASTRODUODENOSCOPY (EGD) WITH PROPOFOL;  Surgeon: Toledo, Benay Pike, MD;  Location: [facility] ENDOSCOPY;  Service:  Endoscopy;  Laterality: N/A;  . GALLBLADDER SURGERY    . REPLACEMENT TOTAL KNEE Bilateral      Family History  Problem Relation Age of Onset  . Prostate cancer Brother   . Bladder Cancer Neg Hx   . Kidney cancer Neg Hx   no bleeding or clotting disorders   Social History   Tobacco Use  . Smoking status: Former Research scientist (life sciences)  . Smokeless tobacco: Never Used  . Tobacco comment: quit 35 years  Vaping Use  . Vaping Use: Never used  Substance Use Topics  . Alcohol use: Yes  . Drug use: No    No Known Allergies  Current Outpatient Medications  Medication Sig Dispense Refill  . allopurinol (ZYLOPRIM) 300 MG tablet TAKE 1 TABLET ONE TIME DAILY    . aspirin EC 81 MG tablet Take by mouth.     . ciprofloxacin (CIPRO) 250 MG tablet Take 1 tablet two times a day before procedure, day of and day after. (Patient not taking: Reported on 06/29/2020) 6 tablet 0  . gabapentin (NEURONTIN) 600 MG tablet TAKE 1 TABLET THREE TIMES DAILY    . hydrochlorothiazide (HYDRODIURIL) 25 MG tablet Take by mouth.    . Melatonin 5 MG CAPS Take by mouth.    . meloxicam (MOBIC) 7.5 MG tablet Take 7.5 mg by mouth daily.    . metFORMIN (GLUCOPHAGE) 1000 MG tablet Take 1,000 mg by mouth 2 (two) times daily  with a meal.    . Multiple Vitamin (MULTI-VITAMINS) TABS Take by mouth.    [place] Kitchen omeprazole (PRILOSEC) 40 MG capsule TAKE 1 CAPSULE TWICE DAILY    . pioglitazone (ACTOS) 15 MG tablet TAKE 1 TABLET ONE TIME DAILY    . sildenafil (REVATIO) 20 MG tablet 1 tablet daily 90 tablet 3  . simvastatin (ZOCOR) 40 MG tablet TAKE 1 TABLET EVERY NIGHT    . tamsulosin (FLOMAX) 0.4 MG CAPS capsule TAKE 1 CAPSULE TWICE DAILY    . traMADol (ULTRAM) 50 MG tablet Take by mouth every 6 (six) hours as needed.    . warfarin (COUMADIN) 3 MG tablet Take 3 mg by mouth. Five days per week    . warfarin (COUMADIN) 5 MG tablet Take by mouth. 2 days per week     No current facility-administered medications for this visit.      REVIEW OF  SYSTEMS (Negative unless checked)  Constitutional: [] Weight loss  [] Fever  [] Chills Cardiac: [] Chest pain   [] Chest pressure   [] Palpitations   [] Shortness of breath when laying flat   [x] Shortness of breath at rest   [x] Shortness of breath with exertion. Vascular:  [] Pain in legs with walking   [] Pain in legs at rest   [] Pain in legs when laying flat   [] Claudication   [] Pain in feet when walking  [] Pain in feet at rest  [] Pain in feet when laying flat   [x] History of DVT   [] Phlebitis   [x] Swelling in legs   [] Varicose veins   [] Non-healing ulcers Pulmonary:   [] Uses home oxygen   [] Productive cough   [] Hemoptysis   [] Wheeze  [] COPD   [] Asthma Neurologic:  [] Dizziness  [] Blackouts   [] Seizures   [] History of stroke   [] History of TIA  [] Aphasia   [] Temporary blindness   [] Dysphagia   [] Weakness or numbness in arms   [] Weakness or numbness in legs Musculoskeletal:  [x] Arthritis   [] Joint swelling   [] Joint pain   [] Low back pain Hematologic:  [] Easy bruising  [] Easy bleeding   [] Hypercoagulable state   [] Anemic  [] Hepatitis Gastrointestinal:  [] Blood in stool   [] Vomiting blood  [] Gastroesophageal reflux/heartburn   [] Abdominal pain Genitourinary:  [] Chronic kidney disease   [] Difficult urination  [] Frequent urination  [] Burning with urination   [] Hematuria Skin:  [] Rashes   [] Ulcers   [] Wounds Psychological:  [] History of anxiety   []  History of major depression.    Physical Exam BP 99/65   Pulse 65   Ht 5\' 9"  (1.753 m)   Wt 256 lb (116.1 kg)   BMI 37.80 kg/m  Gen:  WD/WN, NAD Head: /AT, No temporalis wasting.  Ear/Nose/Throat: Hearing grossly intact, nares w/o erythema or drainage, oropharynx w/o Erythema/Exudate Eyes: Conjunctiva clear, sclera non-icteric  Neck: trachea midline.  No JVD.  Pulmonary:  Good air movement, respirations not labored, no use of accessory muscles  Cardiac: RRR, no JVD Vascular:  Vessel Right Left  Radial Palpable Palpable                            PT 1+ 2+  DP 2+ 1+   Gastrointestinal:. No masses, surgical incisions, or scars. Musculoskeletal: M/S 5/5 throughout.  Extremities without ischemic changes.  No deformity or atrophy. 1-2+ RLE edema, trace LLE edema. Neurologic: Sensation grossly intact in extremities.  Symmetrical.  Speech is fluent. Motor exam as listed above. Psychiatric: Judgment intact, Mood & affect appropriate for pt's clinical situation.  Dermatologic: No rashes or ulcers noted.  No cellulitis or open wounds.    Radiology CT ANGIO CHEST PE W OR WO CONTRAST  Result Date: 09/07/2020 CLINICAL DATA:  Shortness of breath.  History of prostate carcinoma EXAM: CT ANGIOGRAPHY CHEST WITH CONTRAST TECHNIQUE: Multidetector CT imaging of the chest was performed using the standard protocol during bolus administration of intravenous contrast. Multiplanar CT image reconstructions and MIPs were obtained to evaluate the vascular anatomy. CONTRAST:  77mL OMNIPAQUE IOHEXOL 350 MG/ML SOLN COMPARISON:  May 18, 2016 FINDINGS: Cardiovascular: There is no demonstrable pulmonary embolus. Ascending thoracic aortic diameter measures 4.4 x 4.3 cm. No evident dissection. Note that the contrast bolus in the aorta is not sufficient for dissection assessment. Visualized great vessels appear unremarkable on this study. There are foci of aortic atherosclerosis. There are multiple foci of coronary artery calcification. There is no pericardial effusion or pericardial thickening. Mediastinum/Nodes: Thyroid appears unremarkable. There is no appreciable thoracic adenopathy by size criteria. No esophageal lesions are appreciable. Lungs/Pleura: There is no edema or airspace opacity. There is minimal posterior basilar pleural thickening, stable. No appreciable pleural effusions. Upper Abdomen: There is a cyst in the right lobe of the liver measuring 2.7 x 2.3 cm, smaller than on previous study. Gallbladder absent. Calcification and splenic artery noted. Visualized  upper abdominal structures otherwise appear unremarkable. Musculoskeletal: There are foci of degenerative change in the thoracic spine. There are no blastic or lytic bone lesions. No evident chest wall lesions. Review of the MIP images confirms the above findings. IMPRESSION: 1.  No demonstrable pulmonary embolus. 2. Ascending thoracic aorta measures 4.4 x 4.3 cm in diameter. No dissection evident. Note that the contrast bolus in the aorta is not sufficient for assessment for potential dissection. There is aortic atherosclerosis as well as foci of coronary artery calcification. Recommend annual imaging followup by CTA or MRA. This recommendation follows 2010 ACCF/AHA/AATS/ACR/ASA/SCA/SCAI/SIR/STS/SVM Guidelines for the Diagnosis and Management of Patients with Thoracic Aortic Disease. Circulation. 2010; 121: W098-J191. Aortic aneurysm NOS (ICD10-I71.9). 3.  No edema or airspace opacity.  No pleural effusions. 4.  No evident adenopathy. 5.  Gallbladder absent. Aortic Atherosclerosis (ICD10-I70.0). Electronically Signed   By: Lowella Grip III M.D.   On: 09/07/2020 12:19    Labs No results found for this or any previous visit (from the past 2160 hour(s)).  Assessment/Plan:  No problem-specific Assessment & Plan notes found for this encounter.      Leotis Pain 10/07/2020, 9:33 AM   This note was created with Dragon medical transcription system.  Any errors from dictation are unintentional.

## 2020-10-24 ENCOUNTER — Ambulatory Visit (INDEPENDENT_AMBULATORY_CARE_PROVIDER_SITE_OTHER): Payer: Medicare Other | Admitting: Urology

## 2020-10-24 ENCOUNTER — Encounter: Payer: Self-pay | Admitting: Urology

## 2020-10-24 ENCOUNTER — Other Ambulatory Visit: Payer: Self-pay

## 2020-10-24 VITALS — BP 140/71 | HR 69 | Ht 69.0 in | Wt 257.0 lb

## 2020-10-24 DIAGNOSIS — N3941 Urge incontinence: Secondary | ICD-10-CM | POA: Diagnosis not present

## 2020-10-24 MED ORDER — CIPROFLOXACIN HCL 250 MG PO TABS: ORAL_TABLET | ORAL | 0 refills | Status: DC

## 2020-10-24 NOTE — Progress Notes (Signed)
10/24/2020 1:04 PM   Jimmy Greer 1941/06/30 [id number]  Referring provider: Idelle Crouch, MD [facility] [place],  [place] [address]  Chief Complaint  Patient presents with  . Over Active Bladder    HPI: Patient saw Dr. Bernardo Heater for lower tract symptoms. Myrbetriq helped but was too expensive. He is on Flomax and failed oxybutynin.He minimally benefit from [place] and a dry mouth. He had radiation for prostate cancer. He had a normal cystoscopy recently.He failed PTNS  Patient voids every 1-2 hours and gets up 4-6 times a night. He does better when he does his CPAP. He has ankle edema. He takes a diuretic. He has very sudden urgency and urge incontinence wearing 2 pads a day that are damp to moderately wet  He continues to be bothered by a retracted penis discussed with Dr. Bernardo Heater. He will start to leak and he needs a urinal to decrease the spraying issue.  He understands the percutaneous tibial nerve stimulation done before 2019 when he had his radiation generally speaking cannot be repeated for insurance reasons  He would like to proceed with urodynamics. At his request we gave him his Viagra prescription renewal. We will be assessing his bladder capacity and his bladder overactivity and whether or not he has an element of bladder outlet obstruction. He understands it theoretically sacral nerve stimulation and Botox are potential options.  Patient had voided prior to urodynamics and was catheterized for 30 mL. Maximum bladder capacity 340 mL. Bladder was unstable reaching a pressure of 12 cm of water. He felt urgency. He tried to void off the contraction. He could not void. The pressure reached 100 cm of water. He leaned back and then he could void. No stress incontinence with a Valsalva pressure 167 cm water. He could voluntarily void. He often voids with straining at the end as noted. He voided 168 mL with a maximum  flow 6 mils per second. Maximum voiding pressure 22 cm of water. There was loss of line artifact. Residual was 100 mL. His flow was very intermittent with the voluntary contraction.   Higher retention rates and lower efficacy discussed with all treatments especially Botox post radiation.Patient has medical comorbidities. On blood thinners. Says he readily stops blood thinners all the time. I went over Botox and InterStim and full templates. He will think about it and call us I would like to proceed with 1. Handouts given. Full template utilized.Botox off label and perhaps more bleeding or retention issues with radiation discussed  Today Frequency stable.  Incontinence stable.  Took antibiotics and stopped his Coumadin Patient underwent flexible cystoscopy Cystoscopy and Botox: Penile bulbar membranous urethra normal.  He a grade 1 of 4 bladder trabeculation.  No cystitis.  Trigone easily visualized.  I injected 100 units of Botox in 10 cc of normal saline with my usual template in the lower half of the bladder.  He tolerated it very well.  No bleeding.  Usual protocol will be followed.  Start Coumadin tomorrow unless urine grossly bloody.  Today Patient had Botox June 06, 2020.  Still only using 1 pad a day and very pleased.  No infections.  Frequency stable.  No retention issues  PMH: Past Medical History:  Diagnosis Date  . Arthritis   . Bleeding disorder ([place])   . Cancer ([place])   . Diabetes mellitus without complication ([place])   . GERD (gastroesophageal reflux disease)   . Hypertension     Surgical History:  Past Surgical History:  Procedure Laterality Date  . APPENDECTOMY    . CARPAL TUNNEL RELEASE    . COLONOSCOPY WITH PROPOFOL N/A 09/25/2017   Procedure: COLONOSCOPY WITH PROPOFOL;  Surgeon: Toledo, Benay Pike, MD;  Location: [facility] ENDOSCOPY;  Service: Endoscopy;  Laterality: N/A;  . ESOPHAGOGASTRODUODENOSCOPY (EGD) WITH PROPOFOL N/A 09/25/2017   Procedure:  ESOPHAGOGASTRODUODENOSCOPY (EGD) WITH PROPOFOL;  Surgeon: Toledo, Benay Pike, MD;  Location: [facility] ENDOSCOPY;  Service: Endoscopy;  Laterality: N/A;  . GALLBLADDER SURGERY    . REPLACEMENT TOTAL KNEE Bilateral     Home Medications:  Allergies as of 10/24/2020   No Known Allergies     Medication List       Accurate as of October 24, 2020  1:04 PM. If you have any questions, ask your nurse or doctor.        STOP taking these medications   ciprofloxacin 250 MG tablet Commonly known as: Cipro Stopped by: Reece Packer, MD     TAKE these medications   allopurinol 300 MG tablet Commonly known as: ZYLOPRIM TAKE 1 TABLET ONE TIME DAILY   aspirin EC 81 MG tablet Take by mouth.   gabapentin 600 MG tablet Commonly known as: NEURONTIN TAKE 1 TABLET THREE TIMES DAILY   hydrochlorothiazide 25 MG tablet Commonly known as: HYDRODIURIL Take by mouth.   Melatonin 5 MG Caps Take by mouth.   meloxicam 7.5 MG tablet Commonly known as: MOBIC Take 7.5 mg by mouth daily.   metFORMIN 1000 MG tablet Commonly known as: GLUCOPHAGE Take 1,000 mg by mouth 2 (two) times daily with a meal.   Multi-Vitamins Tabs Take by mouth.   omeprazole 40 MG capsule Commonly known as: PRILOSEC TAKE 1 CAPSULE TWICE DAILY   pioglitazone 15 MG tablet Commonly known as: ACTOS TAKE 1 TABLET ONE TIME DAILY   sildenafil 20 MG tablet Commonly known as: REVATIO 1 tablet daily   simvastatin 40 MG tablet Commonly known as: ZOCOR TAKE 1 TABLET EVERY NIGHT   tamsulosin 0.4 MG Caps capsule Commonly known as: FLOMAX TAKE 1 CAPSULE TWICE DAILY   tiZANidine 4 MG tablet Commonly known as: ZANAFLEX Take by mouth.   traMADol 50 MG tablet Commonly known as: ULTRAM Take by mouth every 6 (six) hours as needed.   Trelegy Ellipta 100-62.5-25 MCG/INH Aepb Generic drug: Fluticasone-Umeclidin-Vilant Inhale into the lungs.   warfarin 5 MG tablet Commonly known as: COUMADIN Take by mouth. 2 days per  week   warfarin 3 MG tablet Commonly known as: COUMADIN Take 3 mg by mouth. Five days per week       Allergies: No Known Allergies  Family History: Family History  Problem Relation Age of Onset  . Prostate cancer Brother   . Bladder Cancer Neg Hx   . Kidney cancer Neg Hx     Social History:  reports that he has quit smoking. He has never used smokeless tobacco. He reports current alcohol use. He reports that he does not use drugs.  ROS:                                        Physical Exam: BP 140/71   Pulse 69   Ht 5\' 9"  (1.753 m)   Wt 257 lb (116.6 kg)   BMI 37.95 kg/m   Constitutional:  Alert and oriented, No acute distress.  Laboratory Data: Lab Results  Component Value Date  WBC 3.9 12/23/2012   HGB 9.0 (L) 01/07/2013   HCT 34.6 (L) 12/23/2012   MCV 83 12/23/2012   PLT 148 (L) 01/07/2013    Lab Results  Component Value Date   CREATININE 0.90 01/07/2013    No results found for: PSA  No results found for: TESTOSTERONE  No results found for: HGBA1C  Urinalysis    Component Value Date/Time   COLORURINE Yellow 12/23/2012 1403   APPEARANCEUR Clear 06/06/2020 1447   LABSPEC 1.010 12/23/2012 1403   PHURINE 5.0 12/23/2012 1403   GLUCOSEU Negative 06/06/2020 1447   GLUCOSEU Negative 12/23/2012 1403   HGBUR 1+ 12/23/2012 1403   BILIRUBINUR Negative 06/06/2020 1447   BILIRUBINUR Negative 12/23/2012 1403   KETONESUR Negative 12/23/2012 1403   PROTEINUR Negative 06/06/2020 1447   PROTEINUR Negative 12/23/2012 1403   NITRITE Negative 06/06/2020 1447   NITRITE Negative 12/23/2012 1403   LEUKOCYTESUR Negative 06/06/2020 1447   LEUKOCYTESUR Negative 12/23/2012 1403    Pertinent Imaging:   Assessment & Plan: We would like to repeat the Botox near the end of the year next month.  I given the prescription with usual protocol.  Stop Coumadin usual protocol.  His wife has dementia and we talked about activity and anesthesia  associated with InterStim.  In the future he may consider this treatment  There are no diagnoses linked to this encounter.  No follow-ups on file.  Reece Packer, MD  [facility] [address], [place], [place] [address] [phone]

## 2020-10-25 ENCOUNTER — Telehealth: Payer: Self-pay | Admitting: Urology

## 2020-10-25 NOTE — Telephone Encounter (Signed)
Ebony from [facility] called the office this morning.  They received a clearance form for patient to stop his coumadin.  They did not prescribe that medication for the patient and do not manage him for that purpose.   They do not have documentation in their records as to who prescribed it for the patient.    Please contact the patient or the patient's pharmacy to obtain that information.

## 2020-10-26 NOTE — Telephone Encounter (Addendum)
Faxed request to Dr. Doy Hutching office, received clearance ok to hold Coumadin and Aspirin for 7 days. Patient aware.

## 2020-11-04 ENCOUNTER — Other Ambulatory Visit: Payer: Self-pay | Admitting: Family Medicine

## 2020-11-04 DIAGNOSIS — N3941 Urge incontinence: Secondary | ICD-10-CM

## 2020-11-14 ENCOUNTER — Other Ambulatory Visit: Payer: Medicare Other

## 2020-11-14 ENCOUNTER — Other Ambulatory Visit: Payer: Self-pay

## 2020-11-14 DIAGNOSIS — N3941 Urge incontinence: Secondary | ICD-10-CM

## 2020-11-15 LAB — URINALYSIS, COMPLETE
Bilirubin, UA: NEGATIVE
Glucose, UA: NEGATIVE
Ketones, UA: NEGATIVE
Leukocytes,UA: NEGATIVE
Nitrite, UA: NEGATIVE
Protein,UA: NEGATIVE
Specific Gravity, UA: 1.02 (ref 1.005–1.030)
Urobilinogen, Ur: 0.2 mg/dL (ref 0.2–1.0)
pH, UA: 5 (ref 5.0–7.5)

## 2020-11-15 LAB — MICROSCOPIC EXAMINATION: Bacteria, UA: NONE SEEN

## 2020-11-16 LAB — CULTURE, URINE COMPREHENSIVE

## 2020-11-21 ENCOUNTER — Other Ambulatory Visit: Payer: Self-pay

## 2020-11-21 ENCOUNTER — Ambulatory Visit: Payer: Medicare Other | Admitting: Urology

## 2020-11-21 ENCOUNTER — Other Ambulatory Visit: Payer: Medicare Other

## 2020-11-21 NOTE — Progress Notes (Signed)
11/21/2020 10:13 AM   [name] May 23, 1941 [id number]  Referring provider: Idelle Crouch, MD [place] [facility] [place],  [place] [address]  No chief complaint on file.   HPI: Reviewed the patient's chart.  Frequency and urge incontinence stable.  His last Botox was June 06, 2020.  He is using 1 pad a day.  He stopped Coumadin as per protocol.   PMH: Past Medical History:  Diagnosis Date  . Arthritis   . Bleeding disorder ([place])   . Cancer ([place])   . Diabetes mellitus without complication ([place])   . GERD (gastroesophageal reflux disease)   . Hypertension     Surgical History: Past Surgical History:  Procedure Laterality Date  . APPENDECTOMY    . CARPAL TUNNEL RELEASE    . COLONOSCOPY WITH PROPOFOL N/A 09/25/2017   Procedure: COLONOSCOPY WITH PROPOFOL;  Surgeon: Toledo, Benay Pike, MD;  Location: [facility] END[facility]COPY;  Service: Endoscopy;  Laterality: N/A;  . ESOPHAGOGASTRODUODENOSCOPY (EGD) WITH PROPOFOL N/A 09/25/2017   Procedure: ESOPHAGOGASTRODUODENOSCOPY (EGD) WITH PROPOFOL;  Surgeon: Toledo, Benay Pike, MD;  Location: [facility];  Service: Endoscopy;  Laterality: N/A;  . GALLBLADDER SURGERY    . REPLACEMENT TOTAL KNEE Bilateral     Home Medications:  Allergies as of 11/21/2020   No Known Allergies     Medication List       Accurate as of November 21, 2020 10:13 AM. If you have any questions, ask your nurse or doctor.        allopurinol 300 MG tablet Commonly known as: ZYLOPRIM TAKE 1 TABLET ONE TIME DAILY   aspirin EC 81 MG tablet Take by mouth.   ciprofloxacin 250 MG tablet Commonly known as: Cipro Take one tablet the day BEFORE the procedure, one day of and day after procedure.   gabapentin 600 MG tablet Commonly known as: NEURONTIN TAKE 1 TABLET THREE TIMES DAILY   hydrochlorothiazide 25 MG tablet Commonly known as: HYDRODIURIL Take by mouth.   Melatonin 5 MG Caps Take by mouth.   meloxicam 7.5 MG  tablet Commonly known as: MOBIC Take 7.5 mg by mouth daily.   metFORMIN 1000 MG tablet Commonly known as: GLUCOPHAGE Take 1,000 mg by mouth 2 (two) times daily with a meal.   Multi-Vitamins Tabs Take by mouth.   omeprazole 40 MG capsule Commonly known as: PRILOSEC TAKE 1 CAPSULE TWICE DAILY   pioglitazone 15 MG tablet Commonly known as: ACTOS TAKE 1 TABLET ONE TIME DAILY   sildenafil 20 MG tablet Commonly known as: REVATIO 1 tablet daily   simvastatin 40 MG tablet Commonly known as: ZOCOR TAKE 1 TABLET EVERY NIGHT   tamsulosin 0.4 MG Caps capsule Commonly known as: FLOMAX TAKE 1 CAPSULE TWICE DAILY   tiZANidine 4 MG tablet Commonly known as: ZANAFLEX Take by mouth.   traMADol 50 MG tablet Commonly known as: ULTRAM Take by mouth every 6 (six) hours as needed.   Trelegy Ellipta 100-62.5-25 MCG/INH Aepb Generic drug: Fluticasone-Umeclidin-Vilant Inhale into the lungs.   warfarin 5 MG tablet Commonly known as: COUMADIN Take by mouth. 2 days per week   warfarin 3 MG tablet Commonly known as: COUMADIN Take 3 mg by mouth. Five days per week       Allergies: No Known Allergies  Family History: Family History  Problem Relation Age of Onset  . Prostate cancer Brother   . Bladder Cancer Neg Hx   . Kidney cancer Neg Hx     Social History:  reports that he has quit smoking. He has never used smokeless tobacco. He reports current alcohol use. He reports that he does not use drugs.  ROS:                                        Physical Exam: There were no vitals taken for this visit.  Constitutional:  Alert and oriented, No acute distress. HEENT: [place] AT, moist mucus membranes.  Trachea midline, no masses. Cardiovascular: No clubbing, cyanosis, or edema. Respiratory: Normal respiratory effort, no increased work of breathing. GI: Abdomen is soft, nontender, nondistended, no abdominal masses GU: No CVA tenderness.  Skin: No rashes,  bruises or suspicious lesions. Lymph: No cervical or inguinal adenopathy. Neurologic: Grossly intact, no focal deficits, moving all 4 extremities. Psychiatric: Normal mood and affect.  Laboratory Data: Lab Results  Component Value Date   WBC 3.9 12/23/2012   HGB 9.0 (L) 01/07/2013   HCT 34.6 (L) 12/23/2012   MCV 83 12/23/2012   PLT 148 (L) 01/07/2013    Lab Results  Component Value Date   CREATININE 0.90 01/07/2013    No results found for: PSA  No results found for: TESTOSTERONE  No results found for: HGBA1C  Urinalysis    Component Value Date/Time   COLORURINE Yellow 12/23/2012 1403   APPEARANCEUR Clear 11/14/2020 1515   LABSPEC 1.010 12/23/2012 1403   PHURINE 5.0 12/23/2012 1403   GLUCOSEU Negative 11/14/2020 1515   GLUCOSEU Negative 12/23/2012 1403   HGBUR 1+ 12/23/2012 1403   BILIRUBINUR Negative 11/14/2020 1515   BILIRUBINUR Negative 12/23/2012 1403   KETONESUR Negative 12/23/2012 1403   PROTEINUR Negative 11/14/2020 1515   PROTEINUR Negative 12/23/2012 1403   NITRITE Negative 11/14/2020 1515   NITRITE Negative 12/23/2012 1403   LEUKOCYTESUR Negative 11/14/2020 1515   LEUKOCYTESUR Negative 12/23/2012 1403    Pertinent Imaging:   Assessment & Plan:  Patient  Visit was reschedule since he was still on blood thinners  1. Mixed incontinence  - Urinalysis, Complete   No follow-ups on file.  Reece Packer, MD  [facility] [address] [place], [place] [address] [phone]

## 2020-11-24 ENCOUNTER — Other Ambulatory Visit: Payer: Self-pay | Admitting: Family Medicine

## 2020-11-24 DIAGNOSIS — N3941 Urge incontinence: Secondary | ICD-10-CM

## 2020-11-28 ENCOUNTER — Other Ambulatory Visit: Payer: Medicare Other

## 2020-11-28 ENCOUNTER — Other Ambulatory Visit: Payer: Medicare Other | Admitting: Urology

## 2020-11-28 ENCOUNTER — Other Ambulatory Visit: Payer: Self-pay

## 2020-11-28 DIAGNOSIS — N3941 Urge incontinence: Secondary | ICD-10-CM

## 2020-11-29 LAB — MICROSCOPIC EXAMINATION: Bacteria, UA: NONE SEEN

## 2020-11-29 LAB — URINALYSIS, COMPLETE
Bilirubin, UA: NEGATIVE
Ketones, UA: NEGATIVE
Leukocytes,UA: NEGATIVE
Nitrite, UA: NEGATIVE
Protein,UA: NEGATIVE
Specific Gravity, UA: 1.025 (ref 1.005–1.030)
Urobilinogen, Ur: 1 mg/dL (ref 0.2–1.0)
pH, UA: 5 (ref 5.0–7.5)

## 2020-12-02 LAB — CULTURE, URINE COMPREHENSIVE

## 2020-12-05 ENCOUNTER — Encounter: Payer: Self-pay | Admitting: Urology

## 2020-12-05 ENCOUNTER — Other Ambulatory Visit: Payer: Self-pay

## 2020-12-05 ENCOUNTER — Ambulatory Visit (INDEPENDENT_AMBULATORY_CARE_PROVIDER_SITE_OTHER): Payer: Medicare Other | Admitting: Urology

## 2020-12-05 VITALS — BP 148/88 | HR 68

## 2020-12-05 DIAGNOSIS — N3941 Urge incontinence: Secondary | ICD-10-CM

## 2020-12-05 MED ORDER — CIPROFLOXACIN HCL 500 MG PO TABS
1000.0000 mg | ORAL_TABLET | Freq: Once | ORAL | Status: AC
  Administered 2020-12-05: 1000 mg via ORAL

## 2020-12-05 MED ORDER — ONABOTULINUMTOXINA 100 UNITS IJ SOLR
100.0000 [IU] | Freq: Once | INTRAMUSCULAR | Status: AC
  Administered 2020-12-05: 100 [IU] via INTRAMUSCULAR

## 2020-12-05 MED ORDER — CIPROFLOXACIN HCL 500 MG PO TABS: 500.0000 mg | ORAL_TABLET | Freq: Two times a day (BID) | ORAL | Status: DC

## 2020-12-05 MED ORDER — LIDOCAINE HCL 2 % IJ SOLN
60.0000 mL | Freq: Once | INTRAMUSCULAR | Status: AC
  Administered 2020-12-05: 1200 mg

## 2020-12-05 NOTE — Progress Notes (Signed)
12/05/2020 1:27 PM   Jimmy Greer 1941/03/18 [id number]  Referring provider: Idelle Crouch, MD [facility] [place],  [place] [address]  Chief Complaint  Patient presents with  . Botulinum Toxin Injection    HPI: Patient saw Dr. Bernardo Heater for lower tract symptoms. Myrbetriq helped but was too expensive. He is on Flomax and failed oxybutynin.He minimally benefit from [place] and a dry mouth. He had radiation for prostate cancer. He failed PTNS  Patient voids every 1-2 hours and gets up 4-6 times a night. He does better when he does his CPAP. He has ankle edema. He takes a diuretic. He has very sudden urgency and urge incontinence wearing 2 pads a day that are damp to moderately wet  He continues to be bothered by a retracted penis discussed with Dr. Bernardo Heater. He will start to leak and he needs a urinal to decrease the spraying issue.  Patient had voided prior to urodynamics and was catheterized for 30 mL. Maximum bladder capacity 340 mL. Bladder was unstable reaching a pressure of 12 cm of water. He felt urgency. He tried to void off the contraction. He could not void. The pressure reached 100 cm of water. He leaned back and then he could void. No stress incontinence with a Valsalva pressure 167 cm water. He could voluntarily void. He often voids with straining at the end as noted. He voided 168 mL with a maximum flow 6 mils per second. Maximum voiding pressure 22 cm of water. There was loss of line artifact. Residual was 100 mL. His flow was very intermittent with the voluntary contraction.   Higher retention rates and lower efficacy discussed with all treatments especially Botox post radiation.Patient has medical comorbidities. On blood thinners. Says he readily stops blood thinners all the time. I went over Botox and InterStim and full templates. He will think about it and call us I would like to proceed with 1. Handouts  given. Full template utilized.Botox off label and perhaps more bleeding or retention issues with radiation discussed   Took antibiotics and stopped his Coumadin Cystoscopy and Botox: Start Coumadin tomorrow unless urine grossly bloody. Patient had Botox June 06, 2020.  Still only using 1 pad a day and very pleased.  No infections.  Frequency stable.  No retention issues  We would like to repeat the Botox near the end of the year next month.  I given the prescription with usual protocol.  Stop Coumadin usual protocol.  His wife has dementia and we talked about activity and anesthesia associated with InterStim.  In the future he may consider this treatment  Today Frequency stable.  Incontinence stable.  Clinically not infected.  Coumadin stopped Patient underwent flexible cystoscopy and Botox injection therapy.  Penile bulbar membranous urethra normal.  He had mild bilobar enlargement of prostate.  No cystitis.  Trigone identified.  I injected 100 units of Botox in 10 cc of normal saline throughout the bladder even near the dome because of flexible cystoscopy.  I was happy with the injections.  No bleeding.  Well-tolerated.   PMH: Past Medical History:  Diagnosis Date  . Arthritis   . Bleeding disorder ([place])   . Cancer ([place])   . Diabetes mellitus without complication ([place])   . GERD (gastroesophageal reflux disease)   . Hypertension     Surgical History: Past Surgical History:  Procedure Laterality Date  . APPENDECTOMY    . CARPAL TUNNEL RELEASE    . COLONOSCOPY WITH  PROPOFOL N/A 09/25/2017   Procedure: COLONOSCOPY WITH PROPOFOL;  Surgeon: Toledo, Benay Pike, MD;  Location: [facility] ENDOSCOPY;  Service: Endoscopy;  Laterality: N/A;  . ESOPHAGOGASTRODUODENOSCOPY (EGD) WITH PROPOFOL N/A 09/25/2017   Procedure: ESOPHAGOGASTRODUODENOSCOPY (EGD) WITH PROPOFOL;  Surgeon: Toledo, Benay Pike, MD;  Location: [facility] ENDOSCOPY;  Service: Endoscopy;  Laterality: N/A;  . GALLBLADDER SURGERY    .  REPLACEMENT TOTAL KNEE Bilateral     Home Medications:  Allergies as of 12/05/2020   No Known Allergies     Medication List       Accurate as of December 05, 2020  1:27 PM. If you have any questions, ask your nurse or doctor.        allopurinol 300 MG tablet Commonly known as: ZYLOPRIM TAKE 1 TABLET ONE TIME DAILY   aspirin EC 81 MG tablet Take by mouth.   ciprofloxacin 250 MG tablet Commonly known as: Cipro Take one tablet the day BEFORE the procedure, one day of and day after procedure.   gabapentin 600 MG tablet Commonly known as: NEURONTIN TAKE 1 TABLET THREE TIMES DAILY   hydrochlorothiazide 25 MG tablet Commonly known as: HYDRODIURIL Take by mouth.   Melatonin 5 MG Caps Take by mouth.   meloxicam 7.5 MG tablet Commonly known as: MOBIC Take 7.5 mg by mouth daily.   metFORMIN 1000 MG tablet Commonly known as: GLUCOPHAGE Take 1,000 mg by mouth 2 (two) times daily with a meal.   Multi-Vitamins Tabs Take by mouth.   omeprazole 40 MG capsule Commonly known as: PRILOSEC TAKE 1 CAPSULE TWICE DAILY   pioglitazone 15 MG tablet Commonly known as: ACTOS TAKE 1 TABLET ONE TIME DAILY   sildenafil 20 MG tablet Commonly known as: REVATIO 1 tablet daily   simvastatin 40 MG tablet Commonly known as: ZOCOR TAKE 1 TABLET EVERY NIGHT   tamsulosin 0.4 MG Caps capsule Commonly known as: FLOMAX TAKE 1 CAPSULE TWICE DAILY   tiZANidine 4 MG tablet Commonly known as: ZANAFLEX Take by mouth.   traMADol 50 MG tablet Commonly known as: ULTRAM Take by mouth every 6 (six) hours as needed.   Trelegy Ellipta 100-62.5-25 MCG/INH Aepb Generic drug: Fluticasone-Umeclidin-Vilant Inhale into the lungs.   warfarin 5 MG tablet Commonly known as: COUMADIN Take by mouth. 2 days per week   warfarin 3 MG tablet Commonly known as: COUMADIN Take 3 mg by mouth. Five days per week       Allergies: No Known Allergies  Family History: Family History  Problem Relation  Age of Onset  . Prostate cancer Brother   . Bladder Cancer Neg Hx   . Kidney cancer Neg Hx     Social History:  reports that he has quit smoking. He has never used smokeless tobacco. He reports current alcohol use. He reports that he does not use drugs.  ROS:                                        Physical Exam: BP (!) 148/88   Pulse 68   Constitutional:  Alert and oriented, No acute distress. HEENT: [place] AT, moist mucus membranes.  Trachea midline, no masses.  Laboratory Data: Lab Results  Component Value Date   WBC 3.9 12/23/2012   HGB 9.0 (L) 01/07/2013   HCT 34.6 (L) 12/23/2012   MCV 83 12/23/2012   PLT 148 (L) 01/07/2013    Lab Results  Component  Value Date   CREATININE 0.90 01/07/2013    No results found for: PSA  No results found for: TESTOSTERONE  No results found for: HGBA1C  Urinalysis    Component Value Date/Time   COLORURINE Yellow 12/23/2012 1403   APPEARANCEUR Clear 11/28/2020 1518   LABSPEC 1.010 12/23/2012 1403   PHURINE 5.0 12/23/2012 1403   GLUCOSEU 3+ (A) 11/28/2020 1518   GLUCOSEU Negative 12/23/2012 1403   HGBUR 1+ 12/23/2012 1403   BILIRUBINUR Negative 11/28/2020 1518   BILIRUBINUR Negative 12/23/2012 1403   KETONESUR Negative 12/23/2012 1403   PROTEINUR Negative 11/28/2020 1518   PROTEINUR Negative 12/23/2012 1403   NITRITE Negative 11/28/2020 1518   NITRITE Negative 12/23/2012 1403   LEUKOCYTESUR Negative 11/28/2020 1518   LEUKOCYTESUR Negative 12/23/2012 1403    Pertinent Imaging:   Assessment & Plan: Prophylax and prophylaxis given.  Follow as per protocol.  Hopefully get a great result  1. Urgency incontinence  - Urinalysis, Complete   No follow-ups on file.  Reece Packer, MD  [facility] [address] [place], [place] [address] [phone]

## 2020-12-05 NOTE — Patient Instructions (Signed)
Finish taking Cipro as directed Self-Cath if needed Follow up in two weeks     Step 1 Get all of your supplies ready and place near you. Step 2 Wash your hands, or put on gloves. Step 3 Wash around the tip of your penis with warm antibacterial soapy water. Step 4 Take catheter out of package and drain the lubricant over toilet. Step 5 While holding the penis at a 45 degree angle from the stomach in one hand and the catheter in the other hand  Step 6 Insert the catheter slowly into your urethra. If there is resistance when the catheter reaches the sphincter muscle,              take a deep breath and gently apply steady pressure.              DO NOT FORCE THE CATHETER Step 7 When the urine begins to flow insert another inch and lower penis. Allow the urine to flow into the toilet. Step 8 When the flow of urine stops, slowly remove the catheter.

## 2020-12-06 LAB — URINALYSIS, COMPLETE
Bilirubin, UA: NEGATIVE
Glucose, UA: NEGATIVE
Ketones, UA: NEGATIVE
Leukocytes,UA: NEGATIVE
Nitrite, UA: NEGATIVE
Protein,UA: NEGATIVE
Specific Gravity, UA: 1.02 (ref 1.005–1.030)
Urobilinogen, Ur: 0.2 mg/dL (ref 0.2–1.0)
pH, UA: 5 (ref 5.0–7.5)

## 2020-12-06 LAB — MICROSCOPIC EXAMINATION: Bacteria, UA: NONE SEEN

## 2020-12-19 ENCOUNTER — Encounter: Payer: Self-pay | Admitting: Physician Assistant

## 2020-12-19 ENCOUNTER — Other Ambulatory Visit: Payer: Self-pay

## 2020-12-19 ENCOUNTER — Ambulatory Visit (INDEPENDENT_AMBULATORY_CARE_PROVIDER_SITE_OTHER): Payer: Medicare Other | Admitting: Physician Assistant

## 2020-12-19 ENCOUNTER — Ambulatory Visit: Payer: Medicare Other | Admitting: Urology

## 2020-12-19 VITALS — BP 146/70 | HR 108 | Ht 69.0 in | Wt 256.0 lb

## 2020-12-19 DIAGNOSIS — N3941 Urge incontinence: Secondary | ICD-10-CM

## 2020-12-19 LAB — BLADDER SCAN AMB NON-IMAGING: Scan Result: 0

## 2020-12-19 NOTE — Progress Notes (Unsigned)
12/19/2020 3:20 PM   Jimmy Greer 1941-08-21 [id number]  CC: Chief Complaint  Patient presents with  . Urinary Incontinence   HPI: Jimmy Greer is a 80 y.o. male with OAB wet s/p intravesical Botox with Dr. Sherron Monday on 12/05/2020 who presents today for follow-up symptom recheck and PVR.  Today he reports stable urinary symptoms of daytime frequency and urgency.  He believes he may have had some improvement in nocturia.  Overall, he believes it may be too early to notice a difference in his urinary symptoms.  In-office UA today positive for 2+ blood and trace protein; urine microscopy with 3-10 RBCs/HPF. PVR 16mL.  PMH: Past Medical History:  Diagnosis Date  . Arthritis   . Bleeding disorder ([facility])   . Cancer ([facility])   . Diabetes mellitus without complication ([facility])   . GERD (gastroesophageal reflux disease)   . Hypertension     Surgical History: Past Surgical History:  Procedure Laterality Date  . APPENDECTOMY    . CARPAL TUNNEL RELEASE    . COLONOSCOPY WITH PROPOFOL N/A 09/25/2017   Procedure: COLONOSCOPY WITH PROPOFOL;  Surgeon: Toledo, Boykin Nearing, MD;  Location: [facility] ENDOSCOPY;  Service: Endoscopy;  Laterality: N/A;  . ESOPHAGOGASTRODUODENOSCOPY (EGD) WITH PROPOFOL N/A 09/25/2017   Procedure: ESOPHAGOGASTRODUODENOSCOPY (EGD) WITH PROPOFOL;  Surgeon: Toledo, Boykin Nearing, MD;  Location: [facility] ENDOSCOPY;  Service: Endoscopy;  Laterality: N/A;  . GALLBLADDER SURGERY    . REPLACEMENT TOTAL KNEE Bilateral     Home Medications:  Allergies as of 12/19/2020   No Known Allergies     Medication List       Accurate as of December 19, 2020  3:20 PM. If you have any questions, ask your nurse or doctor.        allopurinol 300 MG tablet Commonly known as: ZYLOPRIM TAKE 1 TABLET ONE TIME DAILY   aspirin EC 81 MG tablet Take by mouth.   ciprofloxacin 250 MG tablet Commonly known as: Cipro Take one tablet the day BEFORE the procedure, one day of and day after  procedure.   gabapentin 600 MG tablet Commonly known as: NEURONTIN TAKE 1 TABLET THREE TIMES DAILY   hydrochlorothiazide 25 MG tablet Commonly known as: HYDRODIURIL Take by mouth.   Melatonin 5 MG Caps Take by mouth.   meloxicam 7.5 MG tablet Commonly known as: MOBIC Take 7.5 mg by mouth daily.   metFORMIN 1000 MG tablet Commonly known as: GLUCOPHAGE Take 1,000 mg by mouth 2 (two) times daily with a meal.   Multi-Vitamins Tabs Take by mouth.   omeprazole 40 MG capsule Commonly known as: PRILOSEC TAKE 1 CAPSULE TWICE DAILY   pioglitazone 15 MG tablet Commonly known as: ACTOS TAKE 1 TABLET ONE TIME DAILY   sildenafil 20 MG tablet Commonly known as: REVATIO 1 tablet daily   simvastatin 40 MG tablet Commonly known as: ZOCOR TAKE 1 TABLET EVERY NIGHT   tamsulosin 0.4 MG Caps capsule Commonly known as: FLOMAX TAKE 1 CAPSULE TWICE DAILY   tiZANidine 4 MG tablet Commonly known as: ZANAFLEX Take by mouth.   traMADol 50 MG tablet Commonly known as: ULTRAM Take by mouth every 6 (six) hours as needed.   Trelegy Ellipta 100-62.5-25 MCG/INH Aepb Generic drug: Fluticasone-Umeclidin-Vilant Inhale into the lungs.   warfarin 5 MG tablet Commonly known as: COUMADIN Take by mouth. 2 days per week   warfarin 3 MG tablet Commonly known as: COUMADIN Take 3 mg by mouth. Five days per week  Allergies:  No Known Allergies  Family History: Family History  Problem Relation Age of Onset  . Prostate cancer Brother   . Bladder Cancer Neg Hx   . Kidney cancer Neg Hx     Social History:   reports that he has quit smoking. He has never used smokeless tobacco. He reports current alcohol use. He reports that he does not use drugs.  Physical Exam: There were no vitals taken for this visit.  Constitutional:  Alert and oriented, no acute distress, nontoxic appearing HEENT: [place], AT Cardiovascular: No clubbing, cyanosis, or edema Respiratory: Normal respiratory  effort, no increased work of breathing Skin: No rashes, bruises or suspicious lesions Neurologic: Grossly intact, no focal deficits, moving all 4 extremities Psychiatric: Normal mood and affect  Laboratory Data: Results for orders placed or performed in visit on 12/19/20  Microscopic Examination   Urine  Result Value Ref Range   WBC, UA None seen 0 - 5 /hpf   RBC 3-10 (A) 0 - 2 /hpf   Epithelial Cells (non renal) 0-10 0 - 10 /hpf   Bacteria, UA Few (A) None seen/Few  Urinalysis, Complete  Result Value Ref Range   Specific Gravity, UA 1.020 1.005 - 1.030   pH, UA 5.0 5.0 - 7.5   Color, UA Yellow Yellow   Appearance Ur Clear Clear   Leukocytes,UA Negative Negative   Protein,UA Trace (A) Negative/Trace   Glucose, UA Negative Negative   Ketones, UA Negative Negative   RBC, UA 2+ (A) Negative   Bilirubin, UA Negative Negative   Urobilinogen, Ur 0.2 0.2 - 1.0 mg/dL   Nitrite, UA Negative Negative   Microscopic Examination See below:   Bladder Scan (Post Void Residual) in office  Result Value Ref Range   Scan Result 0    Assessment & Plan:   1. Urgency incontinence Minimal symptomatic improvement 2 weeks after intravesical Botox.  I agree that it may be too soon to appreciate therapeutic benefit at this point.  Mild microscopic hematuria today consistent with recent intravesical Botox injections.  We will plan for symptom recheck in approximately 4 months assuming his urinary symptoms improve in the coming 1 to 2 weeks.  If not, have counseled him to reach out to our clinic for possible retreatment.  He expressed understanding. - Bladder Scan (Post Void Residual) in office - Urinalysis, Complete  Return in about 4 months (around 04/18/2021) for Symptom recheck with Dr. Matilde Sprang.  Debroah Loop, PA-C  [facility] [address], [place], [place] [address] [phone]

## 2020-12-20 LAB — URINALYSIS, COMPLETE
Bilirubin, UA: NEGATIVE
Glucose, UA: NEGATIVE
Ketones, UA: NEGATIVE
Leukocytes,UA: NEGATIVE
Nitrite, UA: NEGATIVE
Specific Gravity, UA: 1.02 (ref 1.005–1.030)
Urobilinogen, Ur: 0.2 mg/dL (ref 0.2–1.0)
pH, UA: 5 (ref 5.0–7.5)

## 2020-12-20 LAB — MICROSCOPIC EXAMINATION: WBC, UA: NONE SEEN /hpf (ref 0–5)

## 2021-01-04 ENCOUNTER — Inpatient Hospital Stay: Payer: Medicare Other | Attending: Radiation Oncology

## 2021-01-04 ENCOUNTER — Inpatient Hospital Stay: Payer: Medicare Other

## 2021-01-04 DIAGNOSIS — C775 Secondary and unspecified malignant neoplasm of intrapelvic lymph nodes: Secondary | ICD-10-CM | POA: Diagnosis not present

## 2021-01-04 DIAGNOSIS — C61 Malignant neoplasm of prostate: Secondary | ICD-10-CM | POA: Diagnosis not present

## 2021-01-04 DIAGNOSIS — Z923 Personal history of irradiation: Secondary | ICD-10-CM | POA: Diagnosis not present

## 2021-01-04 LAB — PSA: Prostatic Specific Antigen: 0.02 ng/mL (ref 0.00–4.00)

## 2021-01-11 ENCOUNTER — Other Ambulatory Visit: Payer: Self-pay

## 2021-01-11 ENCOUNTER — Ambulatory Visit
Admission: RE | Admit: 2021-01-11 | Discharge: 2021-01-11 | Disposition: A | Discharge status: Home / Self Care | Payer: Medicare Other | Source: Ambulatory Visit | Attending: Radiation Oncology | Admitting: Radiation Oncology

## 2021-01-11 ENCOUNTER — Encounter: Payer: Self-pay | Admitting: Radiation Oncology

## 2021-01-11 ENCOUNTER — Other Ambulatory Visit: Payer: Self-pay | Admitting: *Deleted

## 2021-01-11 DIAGNOSIS — I712 Thoracic aortic aneurysm, without rupture: Secondary | ICD-10-CM | POA: Diagnosis not present

## 2021-01-11 DIAGNOSIS — C775 Secondary and unspecified malignant neoplasm of intrapelvic lymph nodes: Secondary | ICD-10-CM | POA: Diagnosis not present

## 2021-01-11 DIAGNOSIS — C61 Malignant neoplasm of prostate: Secondary | ICD-10-CM | POA: Diagnosis present

## 2021-01-11 DIAGNOSIS — Z923 Personal history of irradiation: Secondary | ICD-10-CM | POA: Diagnosis not present

## 2021-01-11 NOTE — Progress Notes (Signed)
Radiation Oncology Follow up Note  Name: Jimmy Greer   Date:   01/11/2021 MRN:  [id number] DOB: 26-Sep-1941    This 80 y.o. male presents to the clinic today for 2-year follow-up status post IMRT to his prostate and pelvic nodes for stage IIb adenocarcinoma the prostate Gleason score of 8.  REFERRING PROVIDER: Idelle Crouch, MD  HPI: Patient is a 80 year old male now out 2 years having completed IMRT radiation therapy to his prostate and pelvic nodes for stage IIb adenocarcinoma the prostate Gleason score of 8.  He presented with a PSA of 8.6 his most recent PSA is 0.02.  He has been having.  Intravesical Botox by urology for his lower urinary tract symptoms.  That seems to be helping.  He is having no significant bowel problems.  Patient was having some shortness of breath CT scan back in September showed no evidence of pulmonary embolism.  He does have a ascending thoracic aortic aneurysm measuring 4.4 cm.  COMPLICATIONS OF TREATMENT: none  FOLLOW UP COMPLIANCE: keeps appointments   PHYSICAL EXAM:  There were no vitals taken for this visit. Well-developed well-nourished patient in NAD. HEENT reveals PERLA, EOMI, discs not visualized.  Oral cavity is clear. No oral mucosal lesions are identified. Neck is clear without evidence of cervical or supraclavicular adenopathy. Lungs are clear to A&P. Cardiac examination is essentially unremarkable with regular rate and rhythm without murmur rub or thrill. Abdomen is benign with no organomegaly or masses noted. Motor sensory and DTR levels are equal and symmetric in the upper and lower extremities. Cranial nerves II through XII are grossly intact. Proprioception is intact. No peripheral adenopathy or edema is identified. No motor or sensory levels are noted. Crude visual fields are within normal range.  RADIOLOGY RESULTS: CT scan of the chest reviewed showing no evidence of metastatic disease ascending aortic as or masses described above had no  pulmonary embolus.  PLAN: Present time patient is under excellent biochemical control of his prostate cancer he continues close follow-up care and treatment with Botox by urology.  I am pleased with his overall progress.  I have asked to see him back in 1 year for follow-up.  Patient knows to call with any concerns.  I would like to take this opportunity to thank you for allowing me to participate in the care of your patient.Noreene Filbert, MD

## 2021-03-02 ENCOUNTER — Telehealth (INDEPENDENT_AMBULATORY_CARE_PROVIDER_SITE_OTHER): Payer: Self-pay | Admitting: Vascular Surgery

## 2021-03-02 NOTE — Telephone Encounter (Signed)
Noted for documentation purposes.

## 2021-03-21 ENCOUNTER — Other Ambulatory Visit: Payer: Self-pay

## 2021-03-21 ENCOUNTER — Encounter (INDEPENDENT_AMBULATORY_CARE_PROVIDER_SITE_OTHER): Payer: Self-pay | Admitting: Vascular Surgery

## 2021-03-21 ENCOUNTER — Ambulatory Visit (INDEPENDENT_AMBULATORY_CARE_PROVIDER_SITE_OTHER): Payer: Medicare Other | Admitting: Vascular Surgery

## 2021-03-21 VITALS — BP 114/73 | HR 68 | Ht 67.0 in | Wt 262.0 lb

## 2021-03-21 DIAGNOSIS — E118 Type 2 diabetes mellitus with unspecified complications: Secondary | ICD-10-CM

## 2021-03-21 DIAGNOSIS — I712 Thoracic aortic aneurysm, without rupture, unspecified: Secondary | ICD-10-CM

## 2021-03-21 DIAGNOSIS — I10 Essential (primary) hypertension: Secondary | ICD-10-CM

## 2021-03-21 DIAGNOSIS — I2699 Other pulmonary embolism without acute cor pulmonale: Secondary | ICD-10-CM

## 2021-03-21 NOTE — Assessment & Plan Note (Addendum)
Seen for this last year.  Slightly over 4 cm.  This can be checked every year or 2 with CT scan.  Anticoagulation should not really have any bearing on this aneurysm.

## 2021-03-21 NOTE — Assessment & Plan Note (Signed)
blood pressure control important in reducing the progression of atherosclerotic disease. On appropriate oral medications.  

## 2021-03-21 NOTE — Progress Notes (Signed)
Patient ID: Jimmy Greer, male   DOB: 1941/05/27, 80 y.o.   MRN: [id number]  Chief Complaint  Patient presents with  . Follow-up    Referral in from fuad Aleskerov . AAA  W/O rupture . Pt states they are trying to see if he needs to stay on warfrin     HPI Jimmy Greer is a 80 y.o. male.  I am asked to see the patient by Dr. Francesca Oman for evaluation of his anticoagulation regimen.  He had a DVT and PE over a decade ago.  I took out a filter on him about 8 years ago.  He has been on Coumadin since that time.  He seems to be tolerating this well without any obvious issues.  Last fall, he was diagnosed with a small thoracic aortic aneurysm measuring about 4.3 to 4.4 cm in maximal diameter.  His pulmonologist question whether or not the anticoagulation regimen needed to continue and what bearing it would have on the aneurysm.  He is doing well.  He is having no bleeding issues.  He has mild leg swelling bilaterally but nothing severe or different recently.     Past Medical History:  Diagnosis Date  . Arthritis   . Bleeding disorder ([place])   . Cancer ([place])   . Diabetes mellitus without complication ([place])   . GERD (gastroesophageal reflux disease)   . Hypertension     Past Surgical History:  Procedure Laterality Date  . APPENDECTOMY    . CARPAL TUNNEL RELEASE    . COLONOSCOPY WITH PROPOFOL N/A 09/25/2017   Procedure: COLONOSCOPY WITH PROPOFOL;  Surgeon: Toledo, Benay Pike, MD;  Location: [facility] ENDOSCOPY;  Service: Endoscopy;  Laterality: N/A;  . ESOPHAGOGASTRODUODENOSCOPY (EGD) WITH PROPOFOL N/A 09/25/2017   Procedure: ESOPHAGOGASTRODUODENOSCOPY (EGD) WITH PROPOFOL;  Surgeon: Toledo, Benay Pike, MD;  Location: [facility] ENDOSCOPY;  Service: Endoscopy;  Laterality: N/A;  . GALLBLADDER SURGERY    . REPLACEMENT TOTAL KNEE Bilateral      Family History  Problem Relation Age of Onset  . Prostate cancer Brother   . Bladder Cancer Neg Hx   . Kidney cancer Neg Hx      Social History    Tobacco Use  . Smoking status: Former Research scientist (life sciences)  . Smokeless tobacco: Never Used  . Tobacco comment: quit 35 years  Vaping Use  . Vaping Use: Never used  Substance Use Topics  . Alcohol use: Yes  . Drug use: No     No Known Allergies  Current Outpatient Medications  Medication Sig Dispense Refill  . allopurinol (ZYLOPRIM) 300 MG tablet TAKE 1 TABLET ONE TIME DAILY    . aspirin EC 81 MG tablet Take by mouth.    . gabapentin (NEURONTIN) 600 MG tablet TAKE 1 TABLET THREE TIMES DAILY    . hydrochlorothiazide (HYDRODIURIL) 25 MG tablet Take by mouth.    Marland Kitchen HYDROcodone-acetaminophen (NORCO/VICODIN) 5-325 MG tablet 1 po q6h prn    . meloxicam (MOBIC) 7.5 MG tablet Take 7.5 mg by mouth daily.    . metFORMIN (GLUCOPHAGE) 1000 MG tablet Take 1,000 mg by mouth 2 (two) times daily with a meal.    . Multiple Vitamin (MULTI-VITAMINS) TABS Take by mouth.    Marland Kitchen omeprazole (PRILOSEC) 40 MG capsule TAKE 1 CAPSULE TWICE DAILY    . pioglitazone (ACTOS) 30 MG tablet Take 30 mg by mouth daily.    . sildenafil (REVATIO) 20 MG tablet 1 tablet daily 90 tablet 3  . simvastatin (ZOCOR) 40 MG  tablet TAKE 1 TABLET EVERY NIGHT    . tamsulosin (FLOMAX) 0.4 MG CAPS capsule TAKE 1 CAPSULE TWICE DAILY    . tiZANidine (ZANAFLEX) 4 MG tablet Take by mouth daily.    . traMADol (ULTRAM) 50 MG tablet Take by mouth every 6 (six) hours as needed.    . warfarin (COUMADIN) 3 MG tablet Take 3 mg by mouth. Five days per week    . warfarin (COUMADIN) 5 MG tablet Take by mouth. 2 days per week     No current facility-administered medications for this visit.     REVIEW OF SYSTEMS (Negative unless checked)  Constitutional: [] ?Weight loss  [] ?Fever  [] ?Chills Cardiac: [] ?Chest pain   [] ?Chest pressure   [] ?Palpitations   [] ?Shortness of breath when laying flat   [x] ?Shortness of breath at rest   [x] ?Shortness of breath with exertion. Vascular:  [] ?Pain in legs with walking   [] ?Pain in legs at rest   [] ?Pain in legs when  laying flat   [] ?Claudication   [] ?Pain in feet when walking  [] ?Pain in feet at rest  [] ?Pain in feet when laying flat   [x] ?History of DVT   [] ?Phlebitis   [x] ?Swelling in legs   [] ?Varicose veins   [] ?Non-healing ulcers Pulmonary:   [] ?Uses home oxygen   [] ?Productive cough   [] ?Hemoptysis   [] ?Wheeze  [] ?COPD   [] ?Asthma Neurologic:  [] ?Dizziness  [] ?Blackouts   [] ?Seizures   [] ?History of stroke   [] ?History of TIA  [] ?Aphasia   [] ?Temporary blindness   [] ?Dysphagia   [] ?Weakness or numbness in arms   [] ?Weakness or numbness in legs Musculoskeletal:  [x] ?Arthritis   [] ?Joint swelling   [] ?Joint pain   [] ?Low back pain Hematologic:  [] ?Easy bruising  [] ?Easy bleeding   [] ?Hypercoagulable state   [] ?Anemic  [] ?Hepatitis Gastrointestinal:  [] ?Blood in stool   [] ?Vomiting blood  [] ?Gastroesophageal reflux/heartburn   [] ?Abdominal pain Genitourinary:  [] ?Chronic kidney disease   [] ?Difficult urination  [] ?Frequent urination  [] ?Burning with urination   [] ?Hematuria Skin:  [] ?Rashes   [] ?Ulcers   [] ?Wounds Psychological:  [] ?History of anxiety   [] ? History of major depression.    Physical Exam BP 114/73   Pulse 68   Ht 5\' 7"  (1.702 m)   Wt 262 lb (118.8 kg)   BMI 41.04 kg/m  Gen:  WD/WN, NAD Head: [place]/AT, No temporalis wasting.  Ear/Nose/Throat: Hearing grossly intact, nares w/o erythema or drainage, oropharynx w/o Erythema/Exudate Eyes: Conjunctiva clear, sclera non-icteric  Neck: trachea midline.  No JVD.  Pulmonary:  Good air movement, respirations not labored, no use of accessory muscles  Cardiac: RRR, no JVD Vascular:  Vessel Right Left  Radial Palpable Palpable                                   Gastrointestinal:. No masses, surgical incisions, or scars. Musculoskeletal: M/S 5/5 throughout.  Extremities without ischemic changes.  No deformity or atrophy.  Mild bilateral lower extremity edema. Neurologic: Sensation grossly intact in extremities.  Symmetrical.  Speech is  fluent. Motor exam as listed above. Psychiatric: Judgment intact, Mood & affect appropriate for pt's clinical situation. Dermatologic: No rashes or ulcers noted.  No cellulitis or open wounds.    Radiology No results found.  Labs Recent Results (from the past 2160 hour(s))  PSA     Status: None   Collection Time: 01/04/21  9:13 AM  Result Value Ref Range   Prostatic Specific  Antigen 0.02 0.00 - 4.00 ng/mL    Comment: (NOTE) While PSA levels of <=4.0 ng/ml are reported as reference range, some men with levels below 4.0 ng/ml can have prostate cancer and many men with PSA above 4.0 ng/ml do not have prostate cancer.  Other tests such as free PSA, age specific reference ranges, PSA velocity and PSA doubling time may be helpful especially in men less than 52 years old. Performed at [facility], [place] [address] [place], [place] [address]     Assessment/Plan:  Thoracic aortic aneurysm (TAA) [facility]) Seen for this last year.  Slightly over 4 cm.  This can be checked every year or 2 with CT scan.  Anticoagulation should not really have any bearing on this aneurysm.  HTN (hypertension) blood pressure control important in reducing the progression of atherosclerotic disease. On appropriate oral medications.   Type II diabetes mellitus with manifestations (HCC) blood glucose control important in reducing the progression of atherosclerotic disease. Also, involved in wound healing. On appropriate medications.   Pulmonary embolism ([place]) Remote.  We discussed he has been on anticoagulation for roughly a decade following this.  He is tolerating it well.  We have the option of coming off anticoagulation but he would be about 3-4 times higher risk than the general population for recurrent DVT or PE.  We can switch to one of the newer medications that has less variability, but he is doing well on anticoagulation.  Being on or off anticoagulation really should not affect his thoracic aortic  aneurysm at this point.  There is no associated thrombus.  It has minimal risk of rupture at its current size.  At this time, he is leaning towards staying on Coumadin which I am okay with.  He thinks that the newer anticoagulants will be cost prohibitive and he is a little leery of coming off anticoagulation fully at this point which I understand.  I have a follow-up with him later this year and he will keep that appointment.      Leotis Pain 03/21/2021, 3:31 PM   This note was created with Dragon medical transcription system.  Any errors from dictation are unintentional.

## 2021-03-21 NOTE — Assessment & Plan Note (Signed)
blood glucose control important in reducing the progression of atherosclerotic disease. Also, involved in wound healing. On appropriate medications.  

## 2021-03-21 NOTE — Assessment & Plan Note (Signed)
Remote.  We discussed he has been on anticoagulation for roughly a decade following this.  He is tolerating it well.  We have the option of coming off anticoagulation but he would be about 3-4 times higher risk than the general population for recurrent DVT or PE.  We can switch to one of the newer medications that has less variability, but he is doing well on anticoagulation.  Being on or off anticoagulation really should not affect his thoracic aortic aneurysm at this point.  There is no associated thrombus.  It has minimal risk of rupture at its current size.  At this time, he is leaning towards staying on Coumadin which I am okay with.  He thinks that the newer anticoagulants will be cost prohibitive and he is a little leery of coming off anticoagulation fully at this point which I understand.  I have a follow-up with him later this year and he will keep that appointment.

## 2021-04-24 ENCOUNTER — Ambulatory Visit (INDEPENDENT_AMBULATORY_CARE_PROVIDER_SITE_OTHER): Payer: Medicare Other | Admitting: Urology

## 2021-04-24 ENCOUNTER — Other Ambulatory Visit: Payer: Self-pay

## 2021-04-24 ENCOUNTER — Encounter: Payer: Self-pay | Admitting: Urology

## 2021-04-24 ENCOUNTER — Ambulatory Visit: Payer: Medicare Other | Admitting: Urology

## 2021-04-24 VITALS — BP 126/73 | HR 72 | Ht 67.0 in

## 2021-04-24 DIAGNOSIS — N3946 Mixed incontinence: Secondary | ICD-10-CM

## 2021-04-24 NOTE — Progress Notes (Signed)
04/24/2021 2:58 PM   Jimmy Greer 12-03-1941 [id number]  Referring provider: Idelle Crouch, MD [facility] [place],  [place] [address]  No chief complaint on file.   HPI: Patient saw Dr. Bernardo Heater for lower tract symptoms. Myrbetriq helped but was too expensive. He is on Flomax and failed oxybutynin.He minimally benefit from [place] and a dry mouth. He had radiation for prostate cancer. He failed PTNS  Patient voids every 1-2 hours and gets up 4-6 times a night. He does better when he does his CPAP. He has ankle edema. He takes a diuretic. He has very sudden urgency and urge incontinence wearing 2 pads a day that are damp to moderately wet  He continues to be bothered by a retracted penis discussed with Dr. Bernardo Heater. He will start to leak and he needs a urinal to decrease the spraying issue.  Patient had voided prior to urodynamics and was catheterized for 30 mL. Maximum bladder capacity 340 mL. Bladder was unstable reaching a pressure of 12 cm of water. He felt urgency. He tried to void off the contraction. He could not void. The pressure reached 100 cm of water. He leaned back and then he could void. No stress incontinence with a Valsalva pressure 167 cm water. He could voluntarily void. He often voids with straining at the end as noted. He voided 168 mL with a maximum flow 6 mils per second. Maximum voiding pressure 22 cm of water. There was loss of line artifact. Residual was 100 mL. His flow was very intermittent with the voluntary contraction.   Higher retention rates and lower efficacy discussed with all treatments especially Botox post radiation.Patient has medical comorbidities. On blood thinners. Says he readily stops blood thinners all the time. I went over Botox and InterStim and full templates. He will think about it and call us I would like to proceed with 1. Handouts given. Full template utilized.Botox off  label and perhaps more bleeding or retention issues with radiation discussed   Took antibiotics and stopped his Coumadin Cystoscopy and Botox: Start Coumadin tomorrow unless urine grossly bloody. Patient had Botox June 06, 2020. Still only using 1 pad a day and very pleased.   We would like to repeat the Botox near the end of the year next month. I given the prescription with usual protocol. Stop Coumadin usual protocol. His wife has dementia and we talked about activity and anesthesia associated with InterStim. In the future he may consider this treatment  Today On December 05, 2020 patient underwent second Botox-saw a nurse practitioner 2 weeks later and was having some improvement in nighttime frequency but had not had dramatic improvement in incontinence.  Urge incontinence much better only leaking a small amount.  Can go 3 to 7 hours without getting up at night.  Clinically not infected  Once again normal male genitalia not swollen       PMH: Past Medical History:  Diagnosis Date  . Arthritis   . Bleeding disorder ([place])   . Cancer ([place])   . Diabetes mellitus without complication ([place])   . GERD (gastroesophageal reflux disease)   . Hypertension     Surgical History: Past Surgical History:  Procedure Laterality Date  . APPENDECTOMY    . CARPAL TUNNEL RELEASE    . COLONOSCOPY WITH PROPOFOL N/A 09/25/2017   Procedure: COLONOSCOPY WITH PROPOFOL;  Surgeon: Toledo, Benay Pike, MD;  Location: [facility] ENDOSCOPY;  Service: Endoscopy;  Laterality: N/A;  . ESOPHAGOGASTRODUODENOSCOPY (EGD)  WITH PROPOFOL N/A 09/25/2017   Procedure: ESOPHAGOGASTRODUODENOSCOPY (EGD) WITH PROPOFOL;  Surgeon: Toledo, Benay Pike, MD;  Location: [facility] ENDOSCOPY;  Service: Endoscopy;  Laterality: N/A;  . GALLBLADDER SURGERY    . REPLACEMENT TOTAL KNEE Bilateral     Home Medications:  Allergies as of 04/24/2021   No Known Allergies     Medication List       Accurate as of Apr 24, 2021  2:58 PM.  If you have any questions, ask your nurse or doctor.        allopurinol 300 MG tablet Commonly known as: ZYLOPRIM TAKE 1 TABLET ONE TIME DAILY   aspirin EC 81 MG tablet Take by mouth.   gabapentin 600 MG tablet Commonly known as: NEURONTIN TAKE 1 TABLET THREE TIMES DAILY   hydrochlorothiazide 25 MG tablet Commonly known as: HYDRODIURIL Take by mouth.   HYDROcodone-acetaminophen 5-325 MG tablet Commonly known as: NORCO/VICODIN 1 po q6h prn   meloxicam 7.5 MG tablet Commonly known as: MOBIC Take 7.5 mg by mouth daily.   metFORMIN 1000 MG tablet Commonly known as: GLUCOPHAGE Take 1,000 mg by mouth 2 (two) times daily with a meal.   Multi-Vitamins Tabs Take by mouth.   omeprazole 40 MG capsule Commonly known as: PRILOSEC TAKE 1 CAPSULE TWICE DAILY   pioglitazone 30 MG tablet Commonly known as: ACTOS Take 30 mg by mouth daily.   sildenafil 20 MG tablet Commonly known as: REVATIO 1 tablet daily   simvastatin 40 MG tablet Commonly known as: ZOCOR TAKE 1 TABLET EVERY NIGHT   tamsulosin 0.4 MG Caps capsule Commonly known as: FLOMAX TAKE 1 CAPSULE TWICE DAILY   tiZANidine 4 MG tablet Commonly known as: ZANAFLEX Take by mouth daily.   traMADol 50 MG tablet Commonly known as: ULTRAM Take by mouth every 6 (six) hours as needed.   warfarin 5 MG tablet Commonly known as: COUMADIN Take by mouth. 2 days per week   warfarin 3 MG tablet Commonly known as: COUMADIN Take 3 mg by mouth. Five days per week       Allergies: No Known Allergies  Family History: Family History  Problem Relation Age of Onset  . Prostate cancer Brother   . Bladder Cancer Neg Hx   . Kidney cancer Neg Hx     Social History:  reports that he has quit smoking. He has never used smokeless tobacco. He reports current alcohol use. He reports that he does not use drugs.  ROS:                                        Physical Exam: There were no vitals taken  for this visit.  Constitutional:  Alert and oriented, No acute distress.  Laboratory Data: Lab Results  Component Value Date   WBC 3.9 12/23/2012   HGB 9.0 (L) 01/07/2013   HCT 34.6 (L) 12/23/2012   MCV 83 12/23/2012   PLT 148 (L) 01/07/2013    Lab Results  Component Value Date   CREATININE 0.90 01/07/2013    No results found for: PSA  No results found for: TESTOSTERONE  No results found for: HGBA1C  Urinalysis    Component Value Date/Time   COLORURINE Yellow 12/23/2012 1403   APPEARANCEUR Clear 12/19/2020 1518   LABSPEC 1.010 12/23/2012 1403   PHURINE 5.0 12/23/2012 1403   GLUCOSEU Negative 12/19/2020 1518   GLUCOSEU Negative 12/23/2012 1403  HGBUR 1+ 12/23/2012 1403   BILIRUBINUR Negative 12/19/2020 1518   BILIRUBINUR Negative 12/23/2012 1403   KETONESUR Negative 12/23/2012 1403   PROTEINUR Trace (A) 12/19/2020 1518   PROTEINUR Negative 12/23/2012 1403   NITRITE Negative 12/19/2020 1518   NITRITE Negative 12/23/2012 1403   LEUKOCYTESUR Negative 12/19/2020 1518   LEUKOCYTESUR Negative 12/23/2012 1403    Pertinent Imaging:   Assessment & Plan: Patient would like to call when he like to proceed with another Botox.  He does very well with it.  Duration discussed  There are no diagnoses linked to this encounter.  No follow-ups on file.  Reece Packer, MD  [place] [address] [place], [place] [address] [phone]

## 2021-05-01 ENCOUNTER — Ambulatory Visit: Payer: Medicare Other | Attending: Surgery

## 2021-05-01 ENCOUNTER — Other Ambulatory Visit: Payer: Self-pay

## 2021-05-01 DIAGNOSIS — M25611 Stiffness of right shoulder, not elsewhere classified: Secondary | ICD-10-CM | POA: Insufficient documentation

## 2021-05-01 DIAGNOSIS — M25511 Pain in right shoulder: Secondary | ICD-10-CM | POA: Insufficient documentation

## 2021-05-01 DIAGNOSIS — G8929 Other chronic pain: Secondary | ICD-10-CM | POA: Diagnosis present

## 2021-05-01 NOTE — Therapy (Signed)
[facility] [address], [place], [address] Phone: [phone]   Fax:  [phone]  Physical Therapy Evaluation  Patient Details  Name: Jimmy Greer MRN: [id number] Date of Birth: 1941-10-09 Referring Provider (PT): Milagros Evener, MD    Encounter Date: 05/01/2021    PT End of Session - 05/01/21 1550    Visit Number 1    Number of Visits 17    Date for PT Re-Evaluation 06/29/21    Authorization Type 1    Authorization Time Period 10    PT Start Time 1550    PT Stop Time 1633    PT Time Calculation (min) 43 min    Activity Tolerance Patient tolerated treatment well    Behavior During Therapy [facility] for tasks assessed/performed           Past Medical History:  Diagnosis Date  . Arthritis   . Bleeding disorder ([place])   . Cancer ([place])   . Diabetes mellitus without complication ([place])   . GERD (gastroesophageal reflux disease)   . Hypertension     Past Surgical History:  Procedure Laterality Date  . APPENDECTOMY    . CARPAL TUNNEL RELEASE    . COLONOSCOPY WITH PROPOFOL N/A 09/25/2017   Procedure: COLONOSCOPY WITH PROPOFOL;  Surgeon: Toledo, Benay Pike, MD;  Location: [facility] ENDOSCOPY;  Service: Endoscopy;  Laterality: N/A;  . ESOPHAGOGASTRODUODENOSCOPY (EGD) WITH PROPOFOL N/A 09/25/2017   Procedure: ESOPHAGOGASTRODUODENOSCOPY (EGD) WITH PROPOFOL;  Surgeon: Toledo, Benay Pike, MD;  Location: [facility] ENDOSCOPY;  Service: Endoscopy;  Laterality: N/A;  . GALLBLADDER SURGERY    . REPLACEMENT TOTAL KNEE Bilateral     There were no vitals filed for this visit.    Subjective Assessment - 05/01/21 1554    Subjective R shoulder pain (lateral at joint line)  0.5/10 currently (pt resting his R arm). 7/10 at most for the past 3 months.    Pertinent History R shoulder pain. Pain began about 9 months to a year ago, sudden onset. Felt something give when pt reached up over his head and pulled something. Was so bad in the  very beginning and pt could not raise his arm up. Most recent injection was about 3 weeks ago. The second injection seems to have helped.  Could not lift his arm up to bring his coffee cup into the microwave and had to use his L hand to raise it up. Could not lay on his R side due to pain. Pt is R hand dominant. No falls in the last 6 months, no fear of falling.    Patient Stated Goals Be able to get the movement back without having injections.    Currently in Pain? Yes    Pain Score 1     Pain Location Shoulder    Pain Orientation Right    Pain Descriptors / Indicators Aching;Dull;Sharp    Pain Type Chronic pain    Pain Radiating Towards none    Pain Onset More than a month ago    Pain Frequency Occasional    Aggravating Factors  raising his arm up, reaching behind his head, behind his back.    Pain Relieving Factors rest, hydrocodone              [facility] PT Assessment - 05/01/21 1603      Assessment   Medical Diagnosis R rotator cuff tendinitis    Referring Provider (PT) Milagros Evener, MD    Onset Date/Surgical Date 04/10/21  Hand Dominance Right      Precautions   Precaution Comments No known precautions      Restrictions   Other Position/Activity Restrictions No known restrictions      Balance Screen   Has the patient fallen in the past 6 months No    Has the patient had a decrease in activity level because of a fear of falling?  No    Is the patient reluctant to leave their home because of a fear of falling?  No      Observation/Other Assessments   Focus on Therapeutic Outcomes (FOTO)  R shoulder FOTO 45      Posture/Postural Control   Posture Comments forward neck, B scapular protraction L > R, R lateral lean, decreased lordosis      AROM   Right Shoulder Flexion 130 Degrees   142 AAROM   Right Shoulder ABduction 138 Degrees   with pain, 146 AAROM with pain   Right Shoulder Internal Rotation --   Functiona IR: R thumb to R PSIS   Right Shoulder External Rotation --    Functional ER: R middle finger to T1   Cervical Flexion WFL    Cervical Extension WFL    Cervical - Right Side Bend WFL    Cervical - Left Side Bend [facility]      Strength   Right Shoulder Flexion 4+/5    Right Shoulder ABduction 4+/5    Right Shoulder Internal Rotation 4+/5    Right Shoulder External Rotation 4/5    Right Elbow Flexion 4/5    Right Elbow Extension 4+/5    Right Wrist Extension 4/5      Special Tests   Other special tests (+) empty can, (+) Neer's impingement, Hawkin's Kennedy and Yocum tests.                      Objective measurements completed on examination: See above findings.   Wife present during eval   No latex band allergies   Hx of prostate CA with radiation which took care of issue  Blood pressure is controlled.  DM varies, usually high. Doctor knows about it.     R rhomboid muscle tension pulling on C7. No TTP R shoulder  Medbridge Access Code [email or web address]  Therapeutic exercise   Seated B scapular retraction 10x5 seconds for 3 sets  seated R shoulderER isometrics, PT manual resistance 10x5 seconds   Improved exercise technique, movement at target joints, use of target muscles after mod verbal, visual, tactile cues.    Response to treatment Pt tolerated session well without aggravation of symptoms. Decreased R shoulder pain with flexion with scapular retraction.    Clinical impression Pt is a 80 year old male who came to physical therapy secondary to R shoulder pain. He also presents with altered glenohumeral mechanics, poor posture, decreased ER muscle strength, positive special test suggesting impingement as well as infraspinatus involvement; limited R shoulder AROM, and difficulty performing functional tasks such as reaching. Pt will benefit from skilled physical therapy services to address the aforementioned deficits.             PT Education - 05/01/21 1735    Education Details ther-ex, HEP, plan of care    Person(s)  Educated Patient    Methods Explanation;Demonstration;Tactile cues;Verbal cues;Handout    Comprehension Returned demonstration;Verbalized understanding            PT Short Term Goals - 05/01/21 1740      PT  SHORT TERM GOAL #1   Title Pt will be independent with his initial HEP to decrease pain, improve strength, ROM, and ability to reach more comfortably.    Baseline Pt has started his HEP (05/01/2021)    Time 3    Period Weeks    Status New    Target Date 05/25/21             PT Long Term Goals - 05/01/21 1742      PT LONG TERM GOAL #1   Title Pt will have a decrease in R shoulder pain to 3/10 at worst to promote ability to reach more comfortably and with less difficulty.    Baseline 7/10 R shoulder pain at most for the past 7 days (05/01/2021)    Time 8    Period Weeks    Status New    Target Date 06/29/21      PT LONG TERM GOAL #2   Title Patient will improve R shoulder ER strength by at least 1/2 MMT grade to promote ability to raise his arm more comfortably    Baseline R shoulder ER 4/5 (05/01/2021)    Time 8    Period Weeks    Status New    Target Date 06/29/21      PT LONG TERM GOAL #3   Title Patient will improve his functional ER to R middle finger to L scapular spine and functional IR to R thumb to L3 to promote ability to perform self care more comfortably.    Baseline R shoulder functional ER: R middle finger to T1, functiontal IR: R thumb to R PSIS (05/01/2021)    Time 8    Period Weeks    Status New    Target Date 06/29/21      PT LONG TERM GOAL #4   Title Pt will improve his R shoulder FOTO score by at least 10 points as a demonstration of improved function.    Baseline R shoulder FOTO 45 (05/01/2021)    Time 8    Period Weeks    Status New    Target Date 06/29/21                  Plan - 05/01/21 1735    Clinical Impression Statement Pt is a 80 year old male who came to physical therapy secondary to R shoulder pain. He also presents with  altered glenohumeral mechanics, poor posture, decreased ER muscle strength, positive special test suggesting impingement as well as infraspinatus involvement; limited R shoulder AROM, and difficulty performing functional tasks such as reaching. Pt will benefit from skilled physical therapy services to address the aforementioned deficits.    Personal Factors and Comorbidities Age;Comorbidity 3+;Time since onset of injury/illness/exacerbation;Fitness    Comorbidities HTN, hx of prostate CA, DM, athritis    Examination-Activity Limitations Hygiene/Grooming;Reach Overhead;Toileting    Stability/Clinical Decision Making Stable/Uncomplicated    Clinical Decision Making Low    Rehab Potential Fair    PT Frequency 2x / week    PT Duration 8 weeks    PT Treatment/Interventions Therapeutic activities;Therapeutic exercise;Neuromuscular re-education;Patient/family education;Manual techniques;Dry needling;Electrical Stimulation;Iontophoresis 4mg /ml Dexamethasone    PT Next Visit Plan scapular retraction ER strengthening, posture, manual techniques, modalities PRN    PT Home Exercise Plan Medbridge Access Code [email or web address]    Consulted and Agree with Plan of Care Patient           Patient will benefit from skilled therapeutic intervention in order to improve the following deficits  and impairments:  Pain,Postural dysfunction,Improper body mechanics,Impaired UE functional use,Decreased range of motion  Visit Diagnosis: Chronic right shoulder pain - Plan: PT plan of care cert/re-cert  Stiffness of right shoulder joint - Plan: PT plan of care cert/re-cert     Problem List Patient Active Problem List   Diagnosis Date Noted  . Thoracic aortic aneurysm (TAA) ([place]) 10/07/2020  . Chest pain with high risk for cardiac etiology 10/05/2020  . Irregular heart rate 10/05/2020  . SOB (shortness of breath) on exertion 10/05/2020  . Detrusor overactivity 06/30/2020  . Urinary frequency 12/24/2019  . Urge  incontinence 12/24/2019  . Prostate cancer ([place]) 08/21/2018  . Status post total bilateral knee replacement 11/27/2017  . Neuropathy 09/04/2017  . PAD (peripheral artery disease) ([place]) 05/17/2017  . OSA (obstructive sleep apnea) 05/18/2016  . Morbid obesity with BMI of 40.0-44.9, adult ([place]) 02/16/2016  . Lumbar radiculitis 06/08/2015  . DDD (degenerative disc disease), lumbar 12/31/2014  . Spondylosis of lumbar region without myelopathy or radiculopathy 12/31/2014  . Type II diabetes mellitus with manifestations ([place]) 04/29/2014  . HTN (hypertension) 04/29/2014  . Hyperlipidemia, unspecified 04/29/2014  . OA (osteoarthritis) 04/29/2014  . Obesity 04/29/2014  . Pulmonary embolism ([place]) 04/29/2014  . Anticoagulant long-term use 04/16/2014    Joneen Boers PT, DPT   05/01/2021, 6:08 PM  [facility] [address] [place], [address] Phone: [phone]   Fax:  [phone]  Name: Jimmy Greer MRN: [id number] Date of Birth: 1941-03-30

## 2021-05-01 NOTE — Patient Instructions (Signed)
Access Code: [email or web address] URL: [email or web address] Date: 05/01/2021 Prepared by: Joneen Boers  Exercises Seated Scapular Retraction - 1 x daily - 7 x weekly - 3 sets - 10 reps - 5 seconds hold

## 2021-05-03 ENCOUNTER — Ambulatory Visit: Payer: Medicare Other

## 2021-05-03 ENCOUNTER — Other Ambulatory Visit: Payer: Self-pay

## 2021-05-03 DIAGNOSIS — G8929 Other chronic pain: Secondary | ICD-10-CM

## 2021-05-03 DIAGNOSIS — M25611 Stiffness of right shoulder, not elsewhere classified: Secondary | ICD-10-CM

## 2021-05-03 DIAGNOSIS — M25511 Pain in right shoulder: Secondary | ICD-10-CM | POA: Diagnosis not present

## 2021-05-03 NOTE — Therapy (Signed)
[facility] [address] [place], [address] Phone: [phone]   Fax:  [phone]  Physical Therapy Treatment  Patient Details  Name: Jimmy Greer MRN: [id number] Date of Birth: 1941/04/14 Referring Provider (PT): Milagros Evener, MD   Encounter Date: 05/03/2021   PT End of Session - 05/03/21 1147    Visit Number 2    Number of Visits 17    Date for PT Re-Evaluation 06/29/21    Authorization Type 2    Authorization Time Period 10    PT Start Time 1147    PT Stop Time 1233    PT Time Calculation (min) 46 min    Activity Tolerance Patient tolerated treatment well    Behavior During Therapy [facility] for tasks assessed/performed           Past Medical History:  Diagnosis Date  . Arthritis   . Bleeding disorder ([place])   . Cancer ([place])   . Diabetes mellitus without complication ([place])   . GERD (gastroesophageal reflux disease)   . Hypertension     Past Surgical History:  Procedure Laterality Date  . APPENDECTOMY    . CARPAL TUNNEL RELEASE    . COLONOSCOPY WITH PROPOFOL N/A 09/25/2017   Procedure: COLONOSCOPY WITH PROPOFOL;  Surgeon: Toledo, Benay Pike, MD;  Location: [facility] ENDOSCOPY;  Service: Endoscopy;  Laterality: N/A;  . ESOPHAGOGASTRODUODENOSCOPY (EGD) WITH PROPOFOL N/A 09/25/2017   Procedure: ESOPHAGOGASTRODUODENOSCOPY (EGD) WITH PROPOFOL;  Surgeon: Toledo, Benay Pike, MD;  Location: [facility] ENDOSCOPY;  Service: Endoscopy;  Laterality: N/A;  . GALLBLADDER SURGERY    . REPLACEMENT TOTAL KNEE Bilateral     There were no vitals filed for this visit.   Subjective Assessment - 05/03/21 1149    Subjective R shoulder is good. The injection helped for the first time.    Pertinent History R shoulder pain. Pain began about 9 months to a year ago, sudden onset. Felt something give when pt reached up over his head and pulled something. Was so bad in the very beginning and pt could not raise his arm up. Most recent  injection was about 3 weeks ago. The second injection seems to have helped.  Could not lift his arm up to bring his coffee cup into the microwave and had to use his L hand to raise it up. Could not lay on his R side due to pain. Pt is R hand dominant. No falls in the last 6 months, no fear of falling.    Patient Stated Goals Be able to get the movement back without having injections.    Currently in Pain? No/denies    Pain Score 0-No pain    Pain Onset More than a month ago                                     PT Education - 05/03/21 1239    Education Details ther-ex, HEP    Person(s) Educated Patient    Methods Explanation;Demonstration;Tactile cues;Verbal cues;Handout    Comprehension Returned demonstration;Verbalized understanding          Objective   Wife present    No latex band allergies   Hx of prostate CA with radiation which took care of issue  Blood pressure is controlled.  DM varies, usually high. Doctor knows about it.     R rhomboid muscle tension pulling on C7. No TTP  R shoulder  Medbridge Access Code [email or web address]  [email or web address]   Therapeutic exercise   Seated manually resisted scapular retraction targeting lower trap muscles   R 10x5 seconds for 3 sets  L 10x5 seconds for 3 sets  Max cues for scapular mechanics    Standing pectoralis stretch doorway  R 30 seconds x 3   L 30 seconds x 3  Standing B shoulder ER yellow band 10x3     Improved exercise technique, movement at target joints, use of target muscles after mod verbal, visual, tactile cues.   Manual therapy Seated STM R rhomboid, upper trap and pectoralis muscle to decrease tensino and improve scapular mechanics.   Kinesio taping B scapulae along lower trap muscles to promote proper scapular movements     Response to treatment Pt tolerated session well without aggravation of symptoms. Decreased R shoulder pain with flexion with scapular retraction.     Clinical impression Pt demonstrates difficulty with proper scapular retraction and depression, needing max cues to perform properly. [place] tape applied to B shoulders along the pathway of his lower trap muscles to help cue pt for mechanics outside of PT. Worked on scapular strengthening, decreasing pectoralis, upper trap muscle tension as well as ER muscle strengthening to promote better glenohumeral mechanics and decrease shoulder pain when raising his arm. Pt tolerated session well without aggravation of symptoms. Pt will benefit from continued skilled physical therapy services to decrease pain, improve strength and function.      PT Short Term Goals - 05/01/21 1740      PT SHORT TERM GOAL #1   Title Pt will be independent with his initial HEP to decrease pain, improve strength, ROM, and ability to reach more comfortably.    Baseline Pt has started his HEP (05/01/2021)    Time 3    Period Weeks    Status New    Target Date 05/25/21             PT Long Term Goals - 05/01/21 1742      PT LONG TERM GOAL #1   Title Pt will have a decrease in R shoulder pain to 3/10 at worst to promote ability to reach more comfortably and with less difficulty.    Baseline 7/10 R shoulder pain at most for the past 7 days (05/01/2021)    Time 8    Period Weeks    Status New    Target Date 06/29/21      PT LONG TERM GOAL #2   Title Patient will improve R shoulder ER strength by at least 1/2 MMT grade to promote ability to raise his arm more comfortably    Baseline R shoulder ER 4/5 (05/01/2021)    Time 8    Period Weeks    Status New    Target Date 06/29/21      PT LONG TERM GOAL #3   Title Patient will improve his functional ER to R middle finger to L scapular spine and functional IR to R thumb to L3 to promote ability to perform self care more comfortably.    Baseline R shoulder functional ER: R middle finger to T1, functiontal IR: R thumb to R PSIS (05/01/2021)    Time 8    Period Weeks     Status New    Target Date 06/29/21      PT LONG TERM GOAL #4   Title Pt will improve his R shoulder FOTO score by at least 10 points as a demonstration  of improved function.    Baseline R shoulder FOTO 45 (05/01/2021)    Time 8    Period Weeks    Status New    Target Date 06/29/21                 Plan - 05/03/21 1147    Clinical Impression Statement Pt demonstrates difficulty with proper scapular retraction and depression, needing max cues to perform properly. [place] tape applied to B shoulders along the pathway of his lower trap muscles to help cue pt for mechanics outside of PT. Worked on scapular strengthening, decreasing pectoralis, upper trap muscle tension as well as ER muscle strengthening to promote better glenohumeral mechanics and decrease shoulder pain when raising his arm. Pt tolerated session well without aggravation of symptoms. Pt will benefit from continued skilled physical therapy services to decrease pain, improve strength and function.    Personal Factors and Comorbidities Age;Comorbidity 3+;Time since onset of injury/illness/exacerbation;Fitness    Comorbidities HTN, hx of prostate CA, DM, athritis    Examination-Activity Limitations Hygiene/Grooming;Reach Overhead;Toileting    Stability/Clinical Decision Making Stable/Uncomplicated    Rehab Potential Fair    PT Frequency 2x / week    PT Duration 8 weeks    PT Treatment/Interventions Therapeutic activities;Therapeutic exercise;Neuromuscular re-education;Patient/family education;Manual techniques;Dry needling;Electrical Stimulation;Iontophoresis 4mg /ml Dexamethasone    PT Next Visit Plan scapular retraction ER strengthening, posture, manual techniques, modalities PRN    PT Home Exercise Plan Medbridge Access Code [email or web address]    Consulted and Agree with Plan of Care Patient           Patient will benefit from skilled therapeutic intervention in order to improve the following deficits and impairments:   Pain,Postural dysfunction,Improper body mechanics,Impaired UE functional use,Decreased range of motion  Visit Diagnosis: Chronic right shoulder pain  Stiffness of right shoulder joint     Problem List Patient Active Problem List   Diagnosis Date Noted  . Thoracic aortic aneurysm (TAA) ([place]) 10/07/2020  . Chest pain with high risk for cardiac etiology 10/05/2020  . Irregular heart rate 10/05/2020  . SOB (shortness of breath) on exertion 10/05/2020  . Detrusor overactivity 06/30/2020  . Urinary frequency 12/24/2019  . Urge incontinence 12/24/2019  . Prostate cancer ([place]) 08/21/2018  . Status post total bilateral knee replacement 11/27/2017  . Neuropathy 09/04/2017  . PAD (peripheral artery disease) ([place]) 05/17/2017  . OSA (obstructive sleep apnea) 05/18/2016  . Morbid obesity with BMI of 40.0-44.9, adult ([place]) 02/16/2016  . Lumbar radiculitis 06/08/2015  . DDD (degenerative disc disease), lumbar 12/31/2014  . Spondylosis of lumbar region without myelopathy or radiculopathy 12/31/2014  . Type II diabetes mellitus with manifestations ([place]) 04/29/2014  . HTN (hypertension) 04/29/2014  . Hyperlipidemia, unspecified 04/29/2014  . OA (osteoarthritis) 04/29/2014  . Obesity 04/29/2014  . Pulmonary embolism ([place]) 04/29/2014  . Anticoagulant long-term use 04/16/2014     Joneen Boers PT, DPT  05/03/2021, 12:45 PM  [facility] [address] [place], [address] Phone: [phone]   Fax:  [phone]  Name: Jimmy Greer MRN: [id number] Date of Birth: 04-29-1941

## 2021-05-03 NOTE — Patient Instructions (Signed)
Access Code: [email or web address] URL: [email or web address] Date: 05/03/2021 Prepared by: Joneen Boers  Exercises Seated Scapular Retraction - 1 x daily - 7 x weekly - 3 sets - 10 reps - 5 seconds hold Doorway Pec Stretch at 60 Degrees Abduction with Arm Straight - 2 x daily - 7 x weekly - 1 sets - 3 reps - 30 seconds hold Shoulder External Rotation and Scapular Retraction with Resistance - 1 x daily - 7 x weekly - 2 sets - 10 reps

## 2021-05-08 ENCOUNTER — Ambulatory Visit: Payer: Medicare Other

## 2021-05-08 ENCOUNTER — Other Ambulatory Visit: Payer: Self-pay

## 2021-05-08 DIAGNOSIS — M25511 Pain in right shoulder: Secondary | ICD-10-CM | POA: Diagnosis not present

## 2021-05-08 DIAGNOSIS — G8929 Other chronic pain: Secondary | ICD-10-CM

## 2021-05-08 DIAGNOSIS — M25611 Stiffness of right shoulder, not elsewhere classified: Secondary | ICD-10-CM

## 2021-05-08 NOTE — Therapy (Signed)
[facility] [address] [place], [address] Phone: [phone]   Fax:  [phone]  Physical Therapy Treatment  Patient Details  Name: Jimmy Greer MRN: [id number] Date of Birth: 1941/08/11 Referring Provider (PT): Milagros Evener, MD   Encounter Date: 05/08/2021   PT End of Session - 05/08/21 0938    Visit Number 3    Number of Visits 17    Date for PT Re-Evaluation 06/29/21    Authorization Type 3    Authorization Time Period 10    PT Start Time [phone]   Pt arrived late   PT Stop Time 1031    PT Time Calculation (min) 48 min    Activity Tolerance Patient tolerated treatment well    Behavior During Therapy [facility] for tasks assessed/performed           Past Medical History:  Diagnosis Date  . Arthritis   . Bleeding disorder ([place])   . Cancer ([place])   . Diabetes mellitus without complication ([place])   . GERD (gastroesophageal reflux disease)   . Hypertension     Past Surgical History:  Procedure Laterality Date  . APPENDECTOMY    . CARPAL TUNNEL RELEASE    . COLONOSCOPY WITH PROPOFOL N/A 09/25/2017   Procedure: COLONOSCOPY WITH PROPOFOL;  Surgeon: Toledo, Benay Pike, MD;  Location: [facility] ENDOSCOPY;  Service: Endoscopy;  Laterality: N/A;  . ESOPHAGOGASTRODUODENOSCOPY (EGD) WITH PROPOFOL N/A 09/25/2017   Procedure: ESOPHAGOGASTRODUODENOSCOPY (EGD) WITH PROPOFOL;  Surgeon: Toledo, Benay Pike, MD;  Location: [facility] ENDOSCOPY;  Service: Endoscopy;  Laterality: N/A;  . GALLBLADDER SURGERY    . REPLACEMENT TOTAL KNEE Bilateral     There were no vitals filed for this visit.   Subjective Assessment - 05/08/21 0943    Subjective Had a miserable weekend after last session. Had discomfort R upper area. Difficulty turning his head currently. Has to be able to drive to [place]. It was a very miserable weekend.    Pertinent History R shoulder pain. Pain began about 9 months to a year ago, sudden onset. Felt something  give when pt reached up over his head and pulled something. Was so bad in the very beginning and pt could not raise his arm up. Most recent injection was about 3 weeks ago. The second injection seems to have helped.  Could not lift his arm up to bring his coffee cup into the microwave and had to use his L hand to raise it up. Could not lay on his R side due to pain. Pt is R hand dominant. No falls in the last 6 months, no fear of falling.    Patient Stated Goals Be able to get the movement back without having injections.    Currently in Pain? Yes    Pain Score 10-Worst pain ever    Pain Onset More than a month ago                                     PT Education - 05/08/21 1339    Education Details ther-ex    [employer]) Educated Patient    Methods Explanation;Demonstration;Tactile cues;Verbal cues    Comprehension Returned demonstration;Verbalized understanding           Objective   Wife present   No latex band allergies  Hx of prostate CA with radiation which took care of issue  Blood pressure is  controlled.  DM varies, usually high. Doctor knows about it.    R rhomboid muscle tension pulling on C7. No TTP R shoulder  MedbridgeAccess Code [email or web address]  [email or web address]   Therapeutic exercise   Cervical rotation  R: Limited with R neck pain   L WFL with L posterior lateral neck pain  Seated L scapular retraction 10x  R medial scapular discomfort, no symptoms at rest  Seated L cervical side bend 10x2 with 5 second holds   Seated gentle cervical flexion isomtrics in neutral with PT manual resistance. 10x3 with 5 second holds   Seated B scapular retraction 10x3. Very small movements so no shoulder shrug compensation   Seated gentle manually resisted R scapular depression isometrics 10x5 seconds for 3 sets   Pt was recommended to use heat for his upper trap and pectoralis area to decrease muscle tension, ice for his R shoulder to  decrease inflammation. Pt verbalized understanding.   Improved exercise technique, movement at target joints, use of target muscles after mod verbal, visual, tactile cues.      Response to treatment 2/10 at rest. R cervical rotatoin 8/10, 9/10 L cervical rotation    Clinical impression Pt returns to PT with increased pain. Lighter session performed today with focus on gentle scapular mechanics, gentle strengthening and trying to decrease upper trap muscle tension through reciprocal inhibition. Difficulty with scapular mechanics with tendency for shoulder shrug, needing max cues to correct. Forward neck posture may also play with discomfort Pt tolerated session without aggravation of symptoms. Pt will benefit from continued skilled physical therapy services to decrease pain, improve strength and function.     PT Short Term Goals - 05/01/21 1740      PT SHORT TERM GOAL #1   Title Pt will be independent with his initial HEP to decrease pain, improve strength, ROM, and ability to reach more comfortably.    Baseline Pt has started his HEP (05/01/2021)    Time 3    Period Weeks    Status New    Target Date 05/25/21             PT Long Term Goals - 05/01/21 1742      PT LONG TERM GOAL #1   Title Pt will have a decrease in R shoulder pain to 3/10 at worst to promote ability to reach more comfortably and with less difficulty.    Baseline 7/10 R shoulder pain at most for the past 7 days (05/01/2021)    Time 8    Period Weeks    Status New    Target Date 06/29/21      PT LONG TERM GOAL #2   Title Patient will improve R shoulder ER strength by at least 1/2 MMT grade to promote ability to raise his arm more comfortably    Baseline R shoulder ER 4/5 (05/01/2021)    Time 8    Period Weeks    Status New    Target Date 06/29/21      PT LONG TERM GOAL #3   Title Patient will improve his functional ER to R middle finger to L scapular spine and functional IR to R thumb to L3 to  promote ability to perform self care more comfortably.    Baseline R shoulder functional ER: R middle finger to T1, functiontal IR: R thumb to R PSIS (05/01/2021)    Time 8    Period Weeks    Status New    Target Date 06/29/21  PT LONG TERM GOAL #4   Title Pt will improve his R shoulder FOTO score by at least 10 points as a demonstration of improved function.    Baseline R shoulder FOTO 45 (05/01/2021)    Time 8    Period Weeks    Status New    Target Date 06/29/21                 Plan - 05/08/21 1340    Clinical Impression Statement Pt returns to PT with increased pain. Lighter session performed today with focus on gentle scapular mechanics, gentle strengthening and trying to decrease upper trap muscle tension through reciprocal inhibition. Difficulty with scapular mechanics with tendency for shoulder shrug, needing max cues to correct. Forward neck posture may also play with discomfort Pt tolerated session without aggravation of symptoms. Pt will benefit from continued skilled physical therapy services to decrease pain, improve strength and function.    Personal Factors and Comorbidities Age;Comorbidity 3+;Time since onset of injury/illness/exacerbation;Fitness    Comorbidities HTN, hx of prostate CA, DM, athritis    Examination-Activity Limitations Hygiene/Grooming;Reach Overhead;Toileting    Stability/Clinical Decision Making Stable/Uncomplicated    Clinical Decision Making Low    Rehab Potential Fair    PT Frequency 2x / week    PT Duration 8 weeks    PT Treatment/Interventions Therapeutic activities;Therapeutic exercise;Neuromuscular re-education;Patient/family education;Manual techniques;Dry needling;Electrical Stimulation;Iontophoresis 4mg /ml Dexamethasone    PT Next Visit Plan scapular retraction ER strengthening, posture, manual techniques, modalities PRN    PT Home Exercise Plan Medbridge Access Code [email or web address]    Consulted and Agree with Plan of Care Patient            Patient will benefit from skilled therapeutic intervention in order to improve the following deficits and impairments:  Pain,Postural dysfunction,Improper body mechanics,Impaired UE functional use,Decreased range of motion  Visit Diagnosis: Chronic right shoulder pain  Stiffness of right shoulder joint     Problem List Patient Active Problem List   Diagnosis Date Noted  . Thoracic aortic aneurysm (TAA) ([place]) 10/07/2020  . Chest pain with high risk for cardiac etiology 10/05/2020  . Irregular heart rate 10/05/2020  . SOB (shortness of breath) on exertion 10/05/2020  . Detrusor overactivity 06/30/2020  . Urinary frequency 12/24/2019  . Urge incontinence 12/24/2019  . Prostate cancer ([place]) 08/21/2018  . Status post total bilateral knee replacement 11/27/2017  . Neuropathy 09/04/2017  . PAD (peripheral artery disease) ([place]) 05/17/2017  . OSA (obstructive sleep apnea) 05/18/2016  . Morbid obesity with BMI of 40.0-44.9, adult ([place]) 02/16/2016  . Lumbar radiculitis 06/08/2015  . DDD (degenerative disc disease), lumbar 12/31/2014  . Spondylosis of lumbar region without myelopathy or radiculopathy 12/31/2014  . Type II diabetes mellitus with manifestations ([place]) 04/29/2014  . HTN (hypertension) 04/29/2014  . Hyperlipidemia, unspecified 04/29/2014  . OA (osteoarthritis) 04/29/2014  . Obesity 04/29/2014  . Pulmonary embolism ([place]) 04/29/2014  . Anticoagulant long-term use 04/16/2014    Joneen Boers PT, DPT   05/08/2021, 1:42 PM  [facility] [address] [place], [address] Phone: [phone]   Fax:  [phone]  Name: DORON SHAKE MRN: [id number] Date of Birth: 1941/11/08

## 2021-05-09 ENCOUNTER — Ambulatory Visit: Payer: Medicare Other

## 2021-05-11 ENCOUNTER — Ambulatory Visit: Payer: Medicare Other

## 2021-05-16 ENCOUNTER — Ambulatory Visit: Payer: Medicare Other

## 2021-05-18 ENCOUNTER — Ambulatory Visit: Payer: Medicare Other

## 2021-05-23 ENCOUNTER — Ambulatory Visit: Payer: Medicare Other

## 2021-05-25 ENCOUNTER — Ambulatory Visit: Payer: Medicare Other

## 2021-06-21 ENCOUNTER — Other Ambulatory Visit: Payer: Medicare Other

## 2021-06-22 ENCOUNTER — Other Ambulatory Visit: Payer: Self-pay | Admitting: *Deleted

## 2021-06-22 ENCOUNTER — Other Ambulatory Visit: Payer: Medicare Other

## 2021-06-22 ENCOUNTER — Other Ambulatory Visit: Payer: Self-pay

## 2021-06-22 DIAGNOSIS — C61 Malignant neoplasm of prostate: Secondary | ICD-10-CM

## 2021-06-23 LAB — PSA: Prostate Specific Ag, Serum: <0.1 ng/mL (ref 0.0–4.0)

## 2021-06-28 ENCOUNTER — Ambulatory Visit: Payer: Medicare Other | Admitting: Urology

## 2021-06-29 ENCOUNTER — Encounter: Payer: Self-pay | Admitting: Urology

## 2021-06-29 ENCOUNTER — Other Ambulatory Visit: Payer: Self-pay

## 2021-06-29 ENCOUNTER — Ambulatory Visit (INDEPENDENT_AMBULATORY_CARE_PROVIDER_SITE_OTHER): Payer: Medicare Other | Admitting: Urology

## 2021-06-29 VITALS — BP 139/76 | HR 86 | Ht 69.0 in | Wt 260.0 lb

## 2021-06-29 DIAGNOSIS — C61 Malignant neoplasm of prostate: Secondary | ICD-10-CM | POA: Diagnosis not present

## 2021-06-29 DIAGNOSIS — N3941 Urge incontinence: Secondary | ICD-10-CM | POA: Diagnosis not present

## 2021-06-29 DIAGNOSIS — N3281 Overactive bladder: Secondary | ICD-10-CM

## 2021-06-29 NOTE — Progress Notes (Signed)
06/29/2021 2:21 PM   Jimmy Greer 1941-04-26 [id number]  Referring provider: Idelle Crouch, MD Jimmy Greer - [place],  [place] [address]  Chief Complaint  Patient presents with   Prostate Cancer    Urologic history: 1. T2 high risk prostate cancer -Diagnosed 07/2018; PSA 8.6; 48 cc gland -IMRT plus ADT as primary treatment -IMRT completed December 2019  2.  Lower urinary tract symptoms -Post treatment storage related voiding symptoms refractory to medical management -Botox 06/05/2020 and 12/05/2020 by Dr. Matilde Sprang with significant improvement  HPI: 80 y.o. male presents for annual follow-up.  Last saw Dr. Matilde Sprang May 2022 and stated shortly after that visit began to have worsening urinary frequency, urgency and urge incontinence.  Would like to schedule Botox Denies dysuria, gross hematuria PSA 06/22/2021 undetectable at <0.1   PMH: Past Medical History:  Diagnosis Date   Arthritis    Bleeding disorder ([place])    Cancer ([place])    Diabetes mellitus without complication ([place])    GERD (gastroesophageal reflux disease)    Hypertension     Surgical History: Past Surgical History:  Procedure Laterality Date   APPENDECTOMY     CARPAL TUNNEL RELEASE     COLONOSCOPY WITH PROPOFOL N/A 09/25/2017   Procedure: COLONOSCOPY WITH PROPOFOL;  Surgeon: Toledo, Benay Pike, MD;  Location: [facility] ENDOSCOPY;  Service: Endoscopy;  Laterality: N/A;   ESOPHAGOGASTRODUODENOSCOPY (EGD) WITH PROPOFOL N/A 09/25/2017   Procedure: ESOPHAGOGASTRODUODENOSCOPY (EGD) WITH PROPOFOL;  Surgeon: Toledo, Benay Pike, MD;  Location: [facility] ENDOSCOPY;  Service: Endoscopy;  Laterality: N/A;   GALLBLADDER SURGERY     REPLACEMENT TOTAL KNEE Bilateral     Home Medications:  Allergies as of 06/29/2021   No Known Allergies      Medication List        Accurate as of June 29, 2021  2:21 PM. If you have any questions, ask your nurse or doctor.          allopurinol 300 MG  tablet Commonly known as: ZYLOPRIM TAKE 1 TABLET ONE TIME DAILY   aspirin EC 81 MG tablet Take by mouth.   gabapentin 600 MG tablet Commonly known as: NEURONTIN TAKE 1 TABLET THREE TIMES DAILY   hydrochlorothiazide 25 MG tablet Commonly known as: HYDRODIURIL Take by mouth.   HYDROcodone-acetaminophen 5-325 MG tablet Commonly known as: NORCO/VICODIN 1 po q6h prn   meloxicam 7.5 MG tablet Commonly known as: MOBIC Take 7.5 mg by mouth daily.   metFORMIN 1000 MG tablet Commonly known as: GLUCOPHAGE Take 1,000 mg by mouth 2 (two) times daily with a meal.   Multi-Vitamins Tabs Take by mouth.   omeprazole 40 MG capsule Commonly known as: PRILOSEC TAKE 1 CAPSULE TWICE DAILY   pioglitazone 30 MG tablet Commonly known as: ACTOS Take 30 mg by mouth daily.   sildenafil 20 MG tablet Commonly known as: REVATIO 1 tablet daily   simvastatin 40 MG tablet Commonly known as: ZOCOR TAKE 1 TABLET EVERY NIGHT   tamsulosin 0.4 MG Caps capsule Commonly known as: FLOMAX TAKE 1 CAPSULE TWICE DAILY   tiZANidine 4 MG tablet Commonly known as: ZANAFLEX Take by mouth daily.   traMADol 50 MG tablet Commonly known as: ULTRAM Take by mouth every 6 (six) hours as needed.   warfarin 5 MG tablet Commonly known as: COUMADIN Take by mouth. 2 days per week   warfarin 3 MG tablet Commonly known as: COUMADIN Take 3 mg by mouth. Five days per week  Allergies: No Known Allergies  Family History: Family History  Problem Relation Age of Onset   Prostate cancer Brother    Bladder Cancer Neg Hx    Kidney cancer Neg Hx     Social History:  reports that he has quit smoking. He has never used smokeless tobacco. He reports current alcohol use. He reports that he does not use drugs.   Physical Exam: BP 139/76   Pulse 86   Ht 5\' 9"  (1.753 m)   Wt 260 lb (117.9 kg)   BMI 38.40 kg/m   Constitutional:  Alert and oriented, No acute distress. HEENT: [place] AT, moist mucus  membranes.  Trachea midline, no masses. Cardiovascular: No clubbing, cyanosis, or edema. Respiratory: Normal respiratory effort, no increased work of breathing.   Assessment & Plan:    1.  T2 high risk prostate cancer [name]  2.  Overactive bladder Post radiation storage related voiding symptoms refractory to medical management Significant improvement with Botox Recent worsening LUTS and will schedule Botox with Dr. Vedia Pereyra, MD  Jimmy Greer [address] [place], [place] [address] [phone]

## 2021-07-06 ENCOUNTER — Telehealth: Payer: Self-pay | Admitting: *Deleted

## 2021-07-06 NOTE — Telephone Encounter (Signed)
Left patient a VM-started PA for Botox will notify once complete to schedule procedure and UA/UCX drop off.

## 2021-07-10 NOTE — Telephone Encounter (Addendum)
Faxed Cardiac Clearance to Dr. Saralyn Pilar office to hold coumadin 5 days prior to procedure-patient aware

## 2021-07-11 NOTE — Telephone Encounter (Signed)
Received cardiac clearance from Dr. Saralyn Pilar office-hold Aspirin 7 days prior and Coumadin 5 days prior-Left VM to review asked to return call

## 2021-07-24 ENCOUNTER — Other Ambulatory Visit: Payer: Medicare Other

## 2021-07-24 ENCOUNTER — Other Ambulatory Visit: Payer: Self-pay

## 2021-07-24 ENCOUNTER — Other Ambulatory Visit: Payer: Self-pay | Admitting: Family Medicine

## 2021-07-24 DIAGNOSIS — N3281 Overactive bladder: Secondary | ICD-10-CM

## 2021-07-24 DIAGNOSIS — M5412 Radiculopathy, cervical region: Secondary | ICD-10-CM

## 2021-07-25 LAB — URINALYSIS, COMPLETE
Bilirubin, UA: NEGATIVE
Glucose, UA: NEGATIVE
Ketones, UA: NEGATIVE
Leukocytes,UA: NEGATIVE
Nitrite, UA: NEGATIVE
Protein,UA: NEGATIVE
Specific Gravity, UA: 1.015 (ref 1.005–1.030)
Urobilinogen, Ur: 0.2 mg/dL (ref 0.2–1.0)
pH, UA: 5 (ref 5.0–7.5)

## 2021-07-25 LAB — MICROSCOPIC EXAMINATION
Bacteria, UA: NONE SEEN
Epithelial Cells (non renal): NONE SEEN /hpf (ref 0–10)

## 2021-07-27 LAB — CULTURE, URINE COMPREHENSIVE

## 2021-08-02 ENCOUNTER — Ambulatory Visit
Admission: RE | Admit: 2021-08-02 | Discharge: 2021-08-02 | Disposition: A | Discharge status: Home / Self Care | Payer: Medicare Other | Source: Ambulatory Visit | Attending: Family Medicine | Admitting: Family Medicine

## 2021-08-02 ENCOUNTER — Other Ambulatory Visit: Payer: Self-pay

## 2021-08-02 DIAGNOSIS — M5412 Radiculopathy, cervical region: Secondary | ICD-10-CM | POA: Diagnosis present

## 2021-08-07 ENCOUNTER — Other Ambulatory Visit: Payer: Self-pay

## 2021-08-07 ENCOUNTER — Encounter: Payer: Self-pay | Admitting: Urology

## 2021-08-07 ENCOUNTER — Ambulatory Visit (INDEPENDENT_AMBULATORY_CARE_PROVIDER_SITE_OTHER): Payer: Medicare Other | Admitting: Urology

## 2021-08-07 VITALS — BP 139/79 | HR 80 | Ht 69.0 in | Wt 260.0 lb

## 2021-08-07 DIAGNOSIS — N3941 Urge incontinence: Secondary | ICD-10-CM | POA: Diagnosis not present

## 2021-08-07 DIAGNOSIS — C61 Malignant neoplasm of prostate: Secondary | ICD-10-CM

## 2021-08-07 LAB — URINALYSIS, COMPLETE
Bilirubin, UA: NEGATIVE
Glucose, UA: NEGATIVE
Ketones, UA: NEGATIVE
Leukocytes,UA: NEGATIVE
Nitrite, UA: NEGATIVE
Specific Gravity, UA: 1.015 (ref 1.005–1.030)
Urobilinogen, Ur: 1 mg/dL (ref 0.2–1.0)
pH, UA: 5 (ref 5.0–7.5)

## 2021-08-07 LAB — MICROSCOPIC EXAMINATION

## 2021-08-07 MED ORDER — LIDOCAINE HCL 2 % IJ SOLN
60.0000 mL | Freq: Once | INTRAMUSCULAR | Status: AC
  Administered 2021-08-07: 1200 mg

## 2021-08-07 MED ORDER — CIPROFLOXACIN HCL 250 MG PO TABS: 250.0000 mg | ORAL_TABLET | Freq: Two times a day (BID) | ORAL | 0 refills | Status: DC

## 2021-08-07 MED ORDER — ONABOTULINUMTOXINA 100 UNITS IJ SOLR
100.0000 [IU] | Freq: Once | INTRAMUSCULAR | Status: AC
  Administered 2021-08-07: 100 [IU] via INTRAMUSCULAR

## 2021-08-07 NOTE — Patient Instructions (Signed)

## 2021-08-07 NOTE — Progress Notes (Signed)
08/07/2021 10:47 AM   Jimmy Greer 1941-10-05 [id number]  Referring provider: Idelle Crouch, MD [facility] [place],  [place] [address]  Chief Complaint  Patient presents with   Botulinum Toxin Injection    HPI: Reviewed note in detail.  He has urge incontinence treated with Botox.  He has had radiation for prostate cancer.  He has failed percutaneous tibial nerve stimulation.  He stops his blood thinners for Botox  Patient starting to leak more using 1 heavy pad.  Frequency stable.  Clinically not infected.  Blood thinner was stopped  Patient underwent cystoscopy.  Penile bulbar urethra normal.  Bladder mucosa normal.  No cystitis.  I injected 100 units of Botox in 10 cc normal saline with my usual template in the lower half of the bladder more in the midline.  He tolerated very well.  No bleeding.  No pain   PMH: Past Medical History:  Diagnosis Date   Arthritis    Bleeding disorder ([place])    Cancer ([place])    Diabetes mellitus without complication ([place])    GERD (gastroesophageal reflux disease)    Hypertension     Surgical History: Past Surgical History:  Procedure Laterality Date   APPENDECTOMY     CARPAL TUNNEL RELEASE     COLONOSCOPY WITH PROPOFOL N/A 09/25/2017   Procedure: COLONOSCOPY WITH PROPOFOL;  Surgeon: Toledo, Benay Pike, MD;  Location: [facility] ENDOSCOPY;  Service: Endoscopy;  Laterality: N/A;   ESOPHAGOGASTRODUODENOSCOPY (EGD) WITH PROPOFOL N/A 09/25/2017   Procedure: ESOPHAGOGASTRODUODENOSCOPY (EGD) WITH PROPOFOL;  Surgeon: Toledo, Benay Pike, MD;  Location: [facility];  Service: Endoscopy;  Laterality: N/A;   GALLBLADDER SURGERY     REPLACEMENT TOTAL KNEE Bilateral     Home Medications:  Allergies as of 08/07/2021   No Known Allergies      Medication List        Accurate as of August 07, 2021 10:47 AM. If you have any questions, ask your nurse or doctor.          allopurinol 300 MG tablet Commonly known  as: ZYLOPRIM TAKE 1 TABLET ONE TIME DAILY   aspirin EC 81 MG tablet Take by mouth.   gabapentin 600 MG tablet Commonly known as: NEURONTIN TAKE 1 TABLET THREE TIMES DAILY   hydrochlorothiazide 25 MG tablet Commonly known as: HYDRODIURIL Take by mouth.   HYDROcodone-acetaminophen 5-325 MG tablet Commonly known as: NORCO/VICODIN 1 po q6h prn   meloxicam 7.5 MG tablet Commonly known as: MOBIC Take 7.5 mg by mouth daily.   metFORMIN 1000 MG tablet Commonly known as: GLUCOPHAGE Take 1,000 mg by mouth 2 (two) times daily with a meal.   Multi-Vitamins Tabs Take by mouth.   omeprazole 40 MG capsule Commonly known as: PRILOSEC TAKE 1 CAPSULE TWICE DAILY   pioglitazone 30 MG tablet Commonly known as: ACTOS Take 30 mg by mouth daily.   sildenafil 20 MG tablet Commonly known as: REVATIO 1 tablet daily   simvastatin 40 MG tablet Commonly known as: ZOCOR TAKE 1 TABLET EVERY NIGHT   tamsulosin 0.4 MG Caps capsule Commonly known as: FLOMAX TAKE 1 CAPSULE TWICE DAILY   tiZANidine 4 MG tablet Commonly known as: ZANAFLEX Take by mouth daily.   traMADol 50 MG tablet Commonly known as: ULTRAM Take by mouth every 6 (six) hours as needed.   warfarin 5 MG tablet Commonly known as: COUMADIN Take by mouth. 2 days per week   warfarin 3 MG tablet Commonly known as:  COUMADIN Take 3 mg by mouth. Five days per week        Allergies: No Known Allergies  Family History: Family History  Problem Relation Age of Onset   Prostate cancer Brother    Bladder Cancer Neg Hx    Kidney cancer Neg Hx     Social History:  reports that he has quit smoking. He has never used smokeless tobacco. He reports current alcohol use. He reports that he does not use drugs.  ROS:                                        Physical Exam: There were no vitals taken for this visit.  Constitutional:  Alert and oriented, No acute distress. HEENT: [place] AT, moist mucus  membranes.  Trachea midline, no masses.   Laboratory Data: Lab Results  Component Value Date   WBC 3.9 12/23/2012   HGB 9.0 (L) 01/07/2013   HCT 34.6 (L) 12/23/2012   MCV 83 12/23/2012   PLT 148 (L) 01/07/2013    Lab Results  Component Value Date   CREATININE 0.90 01/07/2013    No results found for: PSA  No results found for: TESTOSTERONE  No results found for: HGBA1C  Urinalysis    Component Value Date/Time   COLORURINE Yellow 12/23/2012 1403   APPEARANCEUR Clear 07/24/2021 1505   LABSPEC 1.010 12/23/2012 1403   PHURINE 5.0 12/23/2012 1403   GLUCOSEU Negative 07/24/2021 1505   GLUCOSEU Negative 12/23/2012 1403   HGBUR 1+ 12/23/2012 1403   BILIRUBINUR Negative 07/24/2021 1505   BILIRUBINUR Negative 12/23/2012 1403   KETONESUR Negative 12/23/2012 1403   PROTEINUR Negative 07/24/2021 1505   PROTEINUR Negative 12/23/2012 1403   NITRITE Negative 07/24/2021 1505   NITRITE Negative 12/23/2012 1403   LEUKOCYTESUR Negative 07/24/2021 1505   LEUKOCYTESUR Negative 12/23/2012 1403    Pertinent Imaging:   Assessment & Plan: 3 days of antibiotics sent.  Call tomorrow to check on him.  See as needed  1. Prostate cancer ([place])   2. Urge incontinence  - Urinalysis, Complete   No follow-ups on file.  Reece Packer, MD  [facility] [address], [place], [place] [address] [phone]

## 2021-08-07 NOTE — Progress Notes (Signed)
In and Out Catheterization  Patient is present today for a I & O catheterization due to botox. Patient was cleaned and prepped in a sterile fashion with betadine . A 14FR cath was inserted no complications were noted , 19m of urine return was noted, urine was yellow in color. A clean urine sample was collected for UA. Bladder was drained  And catheter was removed with out difficulty.    Performed by: RVerlene Mayer [place]

## 2021-08-08 ENCOUNTER — Telehealth: Payer: Self-pay | Admitting: *Deleted

## 2021-08-08 NOTE — Telephone Encounter (Addendum)
Patient returned call-doing well post botox-denies dysuria or any other symptoms. Has been taking Cipro. Will contact the office with any changes

## 2021-08-08 NOTE — Telephone Encounter (Signed)
Left VM to return my call

## 2021-09-07 IMAGING — MR MR CERVICAL SPINE W/O CM
5 series · 38 of 48 positions shown · non-contrast
Comparison: No prior MRI of the cervical spine, correlation is made
with CT 03/06/2012

CLINICAL DATA: Neck pain and pain in right shoulder

EXAM:
MRI CERVICAL SPINE WITHOUT CONTRAST
TECHNIQUE: Multiplanar, multisequence MR imaging of the cervical spine was
performed. No intravenous contrast was administered.

[Series 5: T2 · sagittal · 3.0mm · 0.62mm/px · 6 of 15 slices shown (1 of 2)]
[im 1/15]
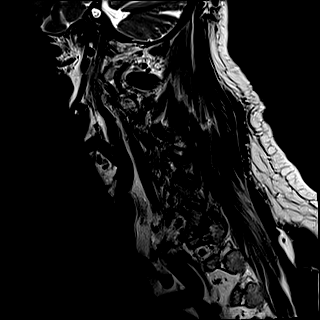
[im 3/15]
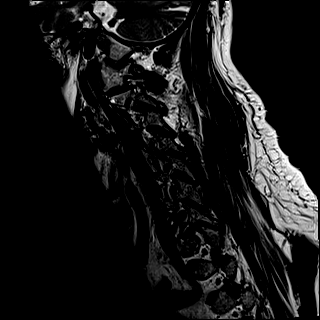
[im 6/15]
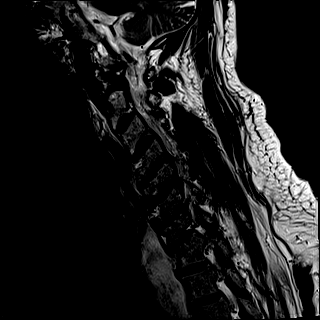
[im 9/15]
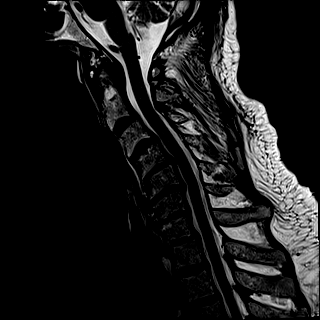
[im 12/15]
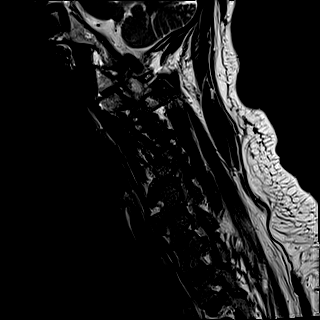
[im 15/15]
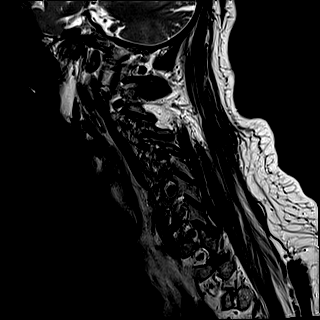

[Series 6: FLAIR · sagittal · 3.0mm · 0.78mm/px · 7 of 15 slices shown]
[im 1/15]
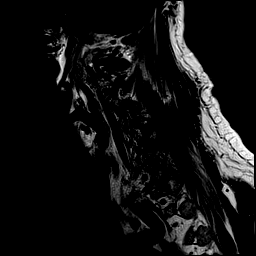
[im 3/15]
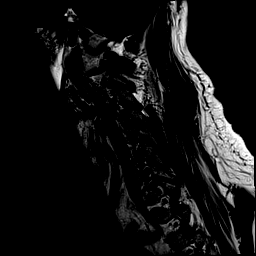
[im 5/15]
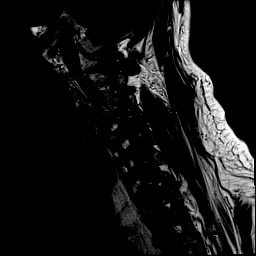
[im 8/15]
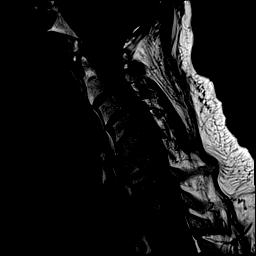
[im 10/15]
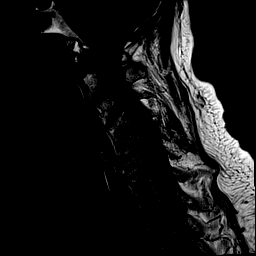
[im 12/15]
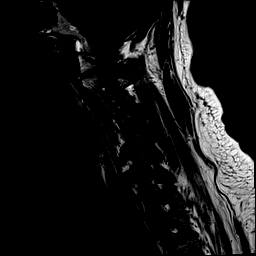
[im 15/15]
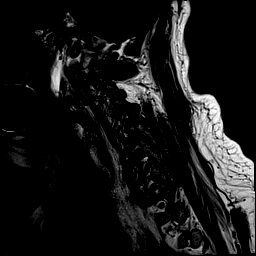

[Series 7: STIR · sagittal · 3.0mm · 0.62mm/px · 7 of 15 slices shown]
[im 1/15]
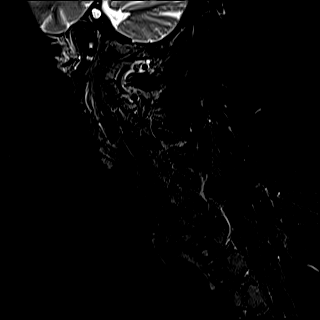
[im 3/15]
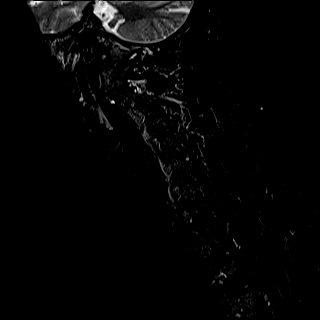
[im 5/15]
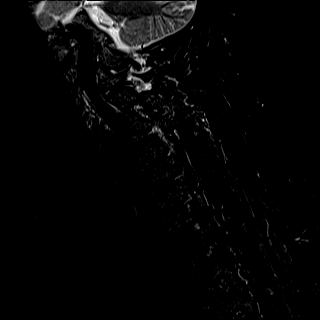
[im 8/15]
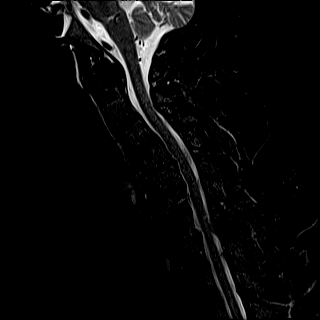
[im 10/15]
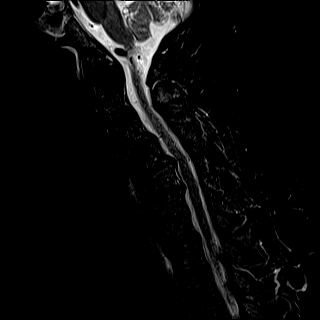
[im 12/15]
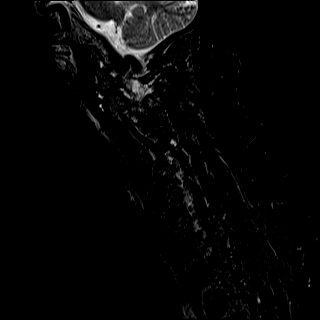
[im 15/15]
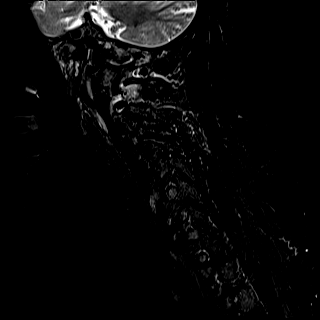

[Series 8: T2 · axial · 3.0mm · 0.70mm/px · z∈[-102,-12]mm · 10 of 29 slices shown (2 of 2)]
[im 1/29]
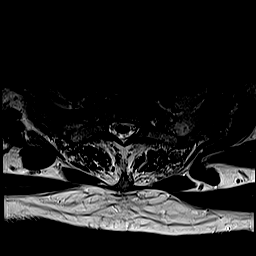
[im 3/29]
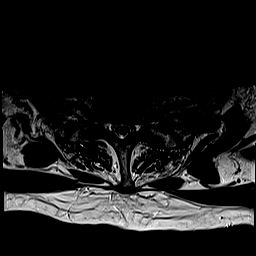
[im 5/29]
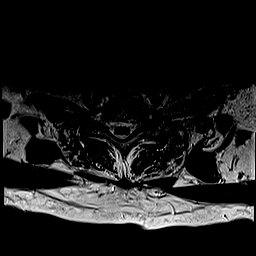
[im 7/29]
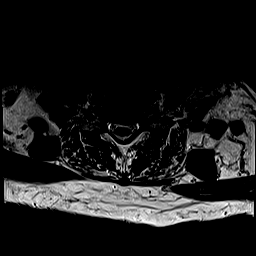
[im 9/29]
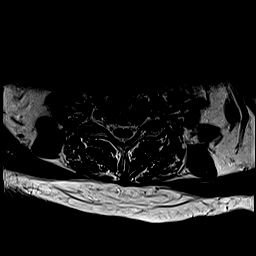
[im 13/29]
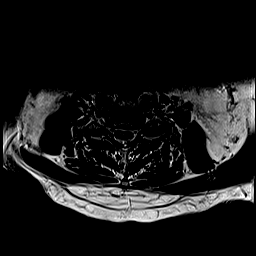
[im 16/29]
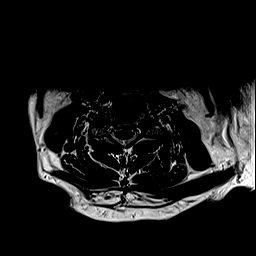
[im 20/29]
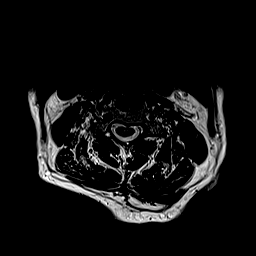
[im 24/29]
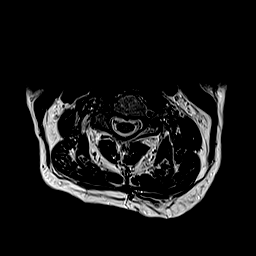
[im 29/29]
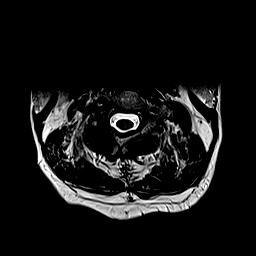

[Series 9: ax mpgr · axial · 3.0mm · 0.35mm/px · z∈[-102,-12]mm · 8 of 29 slices shown]
[im 1/29]
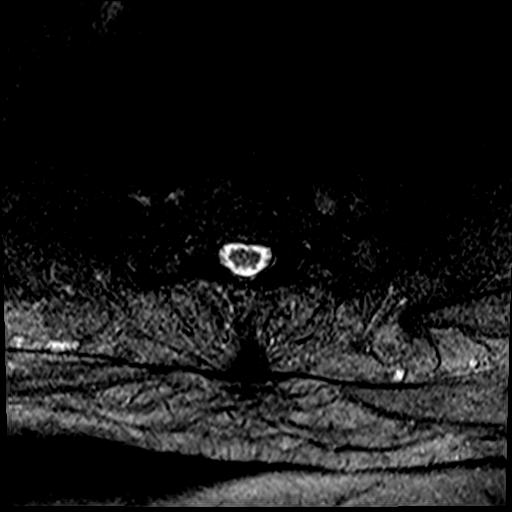
[im 5/29]
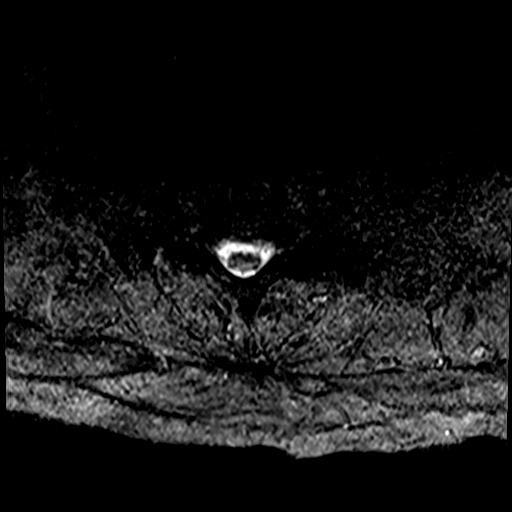
[im 9/29]
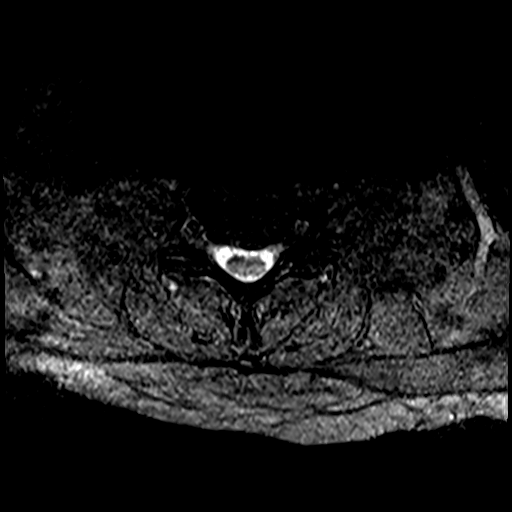
[im 13/29]
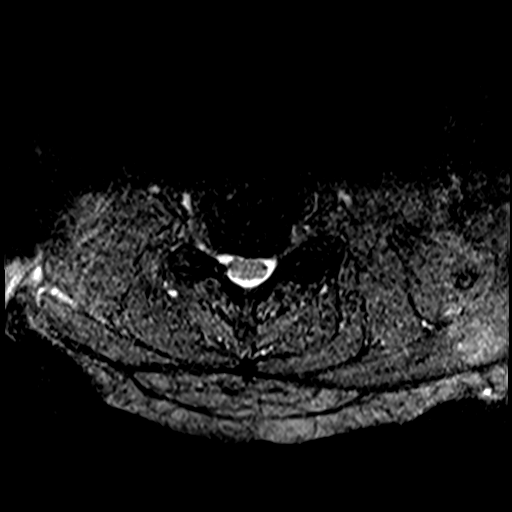
[im 16/29]
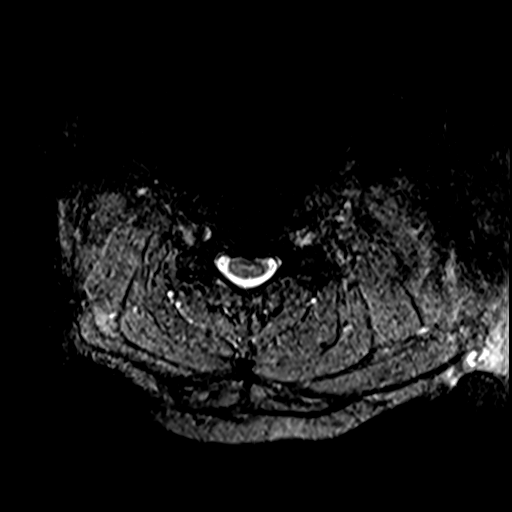
[im 20/29]
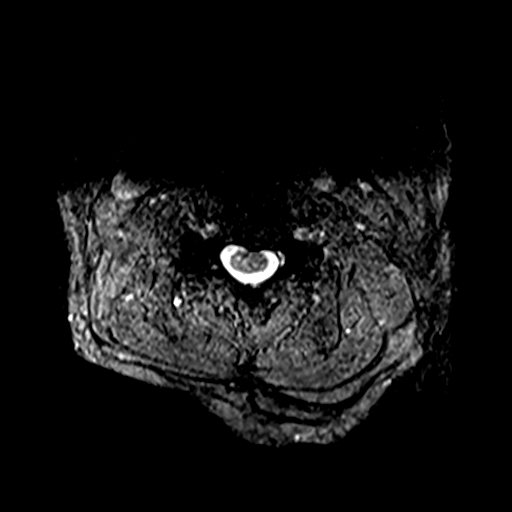
[im 24/29]
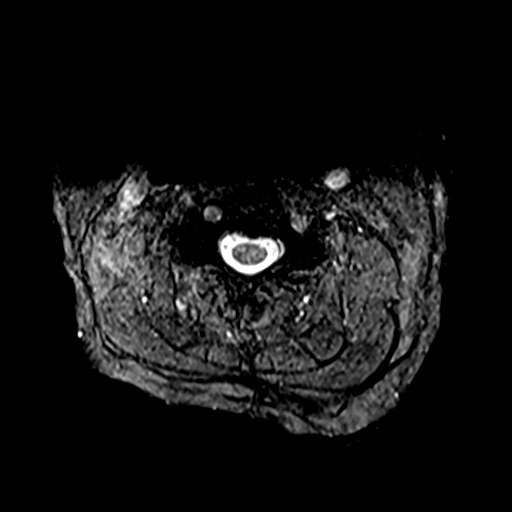
[im 29/29]
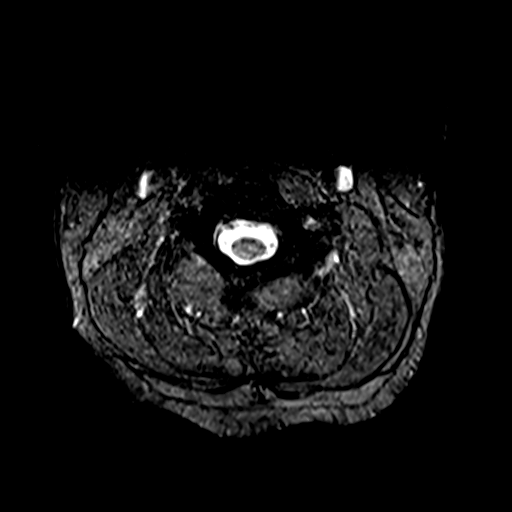

[38 of 48 positions shown; findings below may reference images not displayed]

FINDINGS: Alignment: Straightening and reversal of the normal cervical
lordosis. 2 mm anterolisthesis C2 on C3 and C3 on C4 and 3 mm
anterolisthesis C7 on T1. The anterolisthesis of C2 on C3 and C7 on
T1 are new compared to 6979.

Vertebrae: No fracture, evidence of discitis, or bone lesion.

Cord: Normal signal and morphology.

Posterior Fossa, vertebral arteries, paraspinal tissues: Choose 1

Disc levels:

C2-C3: Trace anterolisthesis and mild disc unroofing, with
superimposed right foraminal disc protrusion. No spinal canal
stenosis. Right greater than left facet arthropathy. Moderate right
and mild left neural foraminal narrowing.

C3-C4: Trace anterolisthesis and disc unroofing with mild disc
bulge. No spinal canal stenosis. Right greater than left facet
arthropathy. Moderate bilateral neural foraminal narrowing.

C4-C5: Disc height loss with broad-based disc bulge.
Left-greater-than-right facet arthropathy. No spinal canal stenosis.
Mild right and moderate left neural foraminal narrowing.

C5-C6: Broad-based disc bulge. No spinal canal stenosis. Facet and
uncovertebral hypertrophy. Moderate to severe bilateral neural
foraminal narrowing.

C6-C7: Disc height loss with mild disc bulge. No spinal canal
stenosis. Facet and uncovertebral hypertrophy. Mild left neural
foraminal narrowing.

C7-T1: Trace anterolisthesis with disc unroofing. No spinal canal
stenosis. Bilateral facet arthropathy. Mild left neural foraminal
narrowing.
IMPRESSION: Multilevel facet arthropathy and degenerative disc disease, which
causes moderate to severe bilateral neural foraminal narrowing at
C5-C6, moderate bilateral neural foraminal narrowing at C3-C4,
moderate right neural foraminal narrowing C2-C3, and moderate left
neural foraminal narrowing at C4-C5. No spinal canal stenosis.

## 2021-09-19 ENCOUNTER — Encounter (INDEPENDENT_AMBULATORY_CARE_PROVIDER_SITE_OTHER): Payer: Medicare Other | Admitting: Vascular Surgery

## 2021-09-19 ENCOUNTER — Other Ambulatory Visit: Payer: Self-pay

## 2021-09-19 ENCOUNTER — Encounter (INDEPENDENT_AMBULATORY_CARE_PROVIDER_SITE_OTHER): Payer: Self-pay | Admitting: Vascular Surgery

## 2021-09-19 ENCOUNTER — Encounter (INDEPENDENT_AMBULATORY_CARE_PROVIDER_SITE_OTHER): Payer: Self-pay

## 2021-10-05 ENCOUNTER — Ambulatory Visit
Admission: RE | Admit: 2021-10-05 | Discharge: 2021-10-05 | Disposition: A | Discharge status: Home / Self Care | Payer: Medicare Other | Source: Ambulatory Visit | Attending: Vascular Surgery | Admitting: Vascular Surgery

## 2021-10-05 ENCOUNTER — Other Ambulatory Visit: Payer: Self-pay

## 2021-10-05 DIAGNOSIS — I712 Thoracic aortic aneurysm, without rupture, unspecified: Secondary | ICD-10-CM

## 2021-10-05 LAB — POCT I-STAT CREATININE: Creatinine, Ser: 1 mg/dL (ref 0.61–1.24)

## 2021-10-05 MED ORDER — IOHEXOL 350 MG/ML SOLN
75.0000 mL | Freq: Once | INTRAVENOUS | Status: AC | PRN
  Administered 2021-10-05: 75 mL via INTRAVENOUS

## 2021-10-10 ENCOUNTER — Ambulatory Visit (INDEPENDENT_AMBULATORY_CARE_PROVIDER_SITE_OTHER): Payer: Medicare Other | Admitting: Vascular Surgery

## 2021-10-10 ENCOUNTER — Other Ambulatory Visit: Payer: Self-pay

## 2021-10-10 ENCOUNTER — Encounter (INDEPENDENT_AMBULATORY_CARE_PROVIDER_SITE_OTHER): Payer: Self-pay | Admitting: Vascular Surgery

## 2021-10-10 VITALS — BP 108/68 | HR 61 | Resp 16 | Wt 262.0 lb

## 2021-10-10 DIAGNOSIS — I7121 Aneurysm of the ascending aorta, without rupture: Secondary | ICD-10-CM | POA: Diagnosis not present

## 2021-10-10 DIAGNOSIS — I10 Essential (primary) hypertension: Secondary | ICD-10-CM | POA: Diagnosis not present

## 2021-10-10 DIAGNOSIS — E118 Type 2 diabetes mellitus with unspecified complications: Secondary | ICD-10-CM | POA: Diagnosis not present

## 2021-10-10 DIAGNOSIS — I2699 Other pulmonary embolism without acute cor pulmonale: Secondary | ICD-10-CM | POA: Diagnosis not present

## 2021-10-10 NOTE — Assessment & Plan Note (Signed)
I have independently reviewed his CT scan of the chest.  His ascending thoracic aorta measures approximately 4.1 cm in maximal diameter which is slightly lower than it was last year which may have been a slight over estimate.  This is stable.  This can be checked every year or 2 going forward with a CT scan.  We will see him back in about a year and can discuss his anticoagulation regimen as well as follow-up his CT scan.

## 2021-10-10 NOTE — Progress Notes (Signed)
MRN : [id number]  Jimmy Greer is a 80 y.o. (Feb 15, 1941) male who presents with chief complaint of  Chief Complaint  Patient presents with   Follow-up    Ct results  .  History of Present Illness: Patient returns today in follow up.  He has undergone a CT angiogram of the chest which I have reviewed which shows a 4.1 cm a sending thoracic aortic aneurysm.  This is slightly less than it was last year which may have been a slight overestimate.  No significant chest pain or shortness of breath.  His leg swelling has been stable.  He continues on Coumadin for his history of severe DVT and PE and his INR has been in the 2-3 range pretty regularly.  He is interested in moving to one of the newer agents, but at current they are cost prohibitive for him.  Current Outpatient Medications  Medication Sig Dispense Refill   allopurinol (ZYLOPRIM) 300 MG tablet TAKE 1 TABLET ONE TIME DAILY     gabapentin (NEURONTIN) 600 MG tablet TAKE 1 TABLET THREE TIMES DAILY     glimepiride (AMARYL) 2 MG tablet Take 2 mg by mouth daily with breakfast.     hydrochlorothiazide (HYDRODIURIL) 25 MG tablet Take by mouth.     HYDROcodone-acetaminophen (NORCO/VICODIN) 5-325 MG tablet 1 po q6h prn     meloxicam (MOBIC) 7.5 MG tablet Take 7.5 mg by mouth daily.     metFORMIN (GLUCOPHAGE) 1000 MG tablet Take 1,000 mg by mouth 2 (two) times daily with a meal.     methocarbamol (ROBAXIN) 750 MG tablet Take 750 mg by mouth 2 (two) times daily.     Multiple Vitamin (MULTI-VITAMINS) TABS Take by mouth.     omeprazole (PRILOSEC) 40 MG capsule TAKE 1 CAPSULE TWICE DAILY     pioglitazone (ACTOS) 30 MG tablet Take 30 mg by mouth daily.     sildenafil (REVATIO) 20 MG tablet 1 tablet daily 90 tablet 3   simvastatin (ZOCOR) 40 MG tablet TAKE 1 TABLET EVERY NIGHT     tamsulosin (FLOMAX) 0.4 MG CAPS capsule TAKE 1 CAPSULE TWICE DAILY     traMADol (ULTRAM) 50 MG tablet Take by mouth every 6 (six) hours as needed.     aspirin EC 81  MG tablet Take by mouth. (Patient not taking: No sig reported)     ciprofloxacin (CIPRO) 250 MG tablet Take 1 tablet (250 mg total) by mouth 2 (two) times daily. (Patient not taking: No sig reported) 6 tablet 0   warfarin (COUMADIN) 3 MG tablet Take 3 mg by mouth. Five days per week (Patient not taking: No sig reported)     warfarin (COUMADIN) 5 MG tablet Take by mouth. 2 days per week (Patient not taking: No sig reported)     No current facility-administered medications for this visit.    Past Medical History:  Diagnosis Date   Arthritis    Bleeding disorder ([place])    Cancer ([place])    Diabetes mellitus without complication ([place])    GERD (gastroesophageal reflux disease)    Hypertension     Past Surgical History:  Procedure Laterality Date   APPENDECTOMY     CARPAL TUNNEL RELEASE     COLONOSCOPY WITH PROPOFOL N/A 09/25/2017   Procedure: COLONOSCOPY WITH PROPOFOL;  Surgeon: [place], Benay Pike, MD;  Location: [facility] ENDOSCOPY;  Service: Endoscopy;  Laterality: N/A;   ESOPHAGOGASTRODUODENOSCOPY (EGD) WITH PROPOFOL N/A 09/25/2017   Procedure: ESOPHAGOGASTRODUODENOSCOPY (EGD) WITH PROPOFOL;  Surgeon: [place],  Benay Pike, MD;  Location: [facility] ENDOSCOPY;  Service: Endoscopy;  Laterality: N/A;   GALLBLADDER SURGERY     REPLACEMENT TOTAL KNEE Bilateral      Social History   Tobacco Use   Smoking status: Former   Smokeless tobacco: Never   Tobacco comments:    quit 35 years  Vaping Use   Vaping Use: Never used  Substance Use Topics   Alcohol use: Yes   Drug use: No      Family History  Problem Relation Age of Onset   Prostate cancer Brother    Bladder Cancer Neg Hx    Kidney cancer Neg Hx     No Known Allergies  REVIEW OF SYSTEMS (Negative unless checked)   Constitutional: [] Weight loss  [] Fever  [] Chills Cardiac: [] Chest pain   [] Chest pressure   [] Palpitations   [] Shortness of breath when laying flat   [x] Shortness of breath at rest   [x] Shortness of breath with  exertion. Vascular:  [] Pain in legs with walking   [] Pain in legs at rest   [] Pain in legs when laying flat   [] Claudication   [] Pain in feet when walking  [] Pain in feet at rest  [] Pain in feet when laying flat   [x] History of DVT   [] Phlebitis   [x] Swelling in legs   [] Varicose veins   [] Non-healing ulcers Pulmonary:   [] Uses home oxygen   [] Productive cough   [] Hemoptysis   [] Wheeze  [] COPD   [] Asthma Neurologic:  [] Dizziness  [] Blackouts   [] Seizures   [] History of stroke   [] History of TIA  [] Aphasia   [] Temporary blindness   [] Dysphagia   [] Weakness or numbness in arms   [] Weakness or numbness in legs Musculoskeletal:  [x] Arthritis   [] Joint swelling   [] Joint pain   [] Low back pain Hematologic:  [] Easy bruising  [] Easy bleeding   [] Hypercoagulable state   [] Anemic  [] Hepatitis Gastrointestinal:  [] Blood in stool   [] Vomiting blood  [] Gastroesophageal reflux/heartburn   [] Abdominal pain Genitourinary:  [] Chronic kidney disease   [] Difficult urination  [] Frequent urination  [] Burning with urination   [] Hematuria Skin:  [] Rashes   [] Ulcers   [] Wounds Psychological:  [] History of anxiety   []  History of major depression.   Physical Examination  BP 108/68 (BP Location: Right Arm)   Pulse 61   Resp 16   Wt 262 lb (118.8 kg)   BMI 38.69 kg/m  Gen:  WD/WN, NAD Head: [place]/AT, No temporalis wasting. Ear/Nose/Throat: Hearing grossly intact, nares w/o erythema or drainage Eyes: Conjunctiva clear. Sclera non-icteric Neck: Supple.  Trachea midline Pulmonary:  Good air movement, no use of accessory muscles.  Cardiac: RRR, no JVD Vascular:  Vessel Right Left  Radial Palpable Palpable       Musculoskeletal: M/S 5/5 throughout.  No deformity or atrophy.  1+ lower extremity edema slightly worse on the right than the left. Neurologic: Sensation grossly intact in extremities.  Symmetrical.  Speech is fluent.  Psychiatric: Judgment intact, Mood & affect appropriate for pt's clinical  situation. Dermatologic: No rashes or ulcers noted.  No cellulitis or open wounds.      Labs Recent Results (from the past 2160 hour(s))  CULTURE, URINE COMPREHENSIVE     Status: None   Collection Time: 07/24/21  3:05 PM   Specimen: Urine   Urine  Result Value Ref Range   Urine Culture, Comprehensive Final report    Organism ID, Bacteria Comment     Comment: No growth in 36 - 48  hours.  Urinalysis, Complete     Status: Abnormal   Collection Time: 07/24/21  3:05 PM  Result Value Ref Range   Specific Gravity, UA 1.015 1.005 - 1.030   pH, UA 5.0 5.0 - 7.5   Color, UA Yellow Yellow   Appearance Ur Clear Clear   Leukocytes,UA Negative Negative   Protein,UA Negative Negative/Trace   Glucose, UA Negative Negative   Ketones, UA Negative Negative   RBC, UA 1+ (A) Negative   Bilirubin, UA Negative Negative   Urobilinogen, Ur 0.2 0.2 - 1.0 mg/dL   Nitrite, UA Negative Negative   Microscopic Examination See below:   Microscopic Examination     Status: Abnormal   Collection Time: 07/24/21  3:05 PM   Urine  Result Value Ref Range   WBC, UA 0-5 0 - 5 /hpf   RBC 3-10 (A) 0 - 2 /hpf   Epithelial Cells (non renal) None seen 0 - 10 /hpf   Bacteria, UA None seen None seen/Few  Urinalysis, Complete     Status: Abnormal   Collection Time: 08/07/21 10:52 AM  Result Value Ref Range   Specific Gravity, UA 1.015 1.005 - 1.030   pH, UA 5.0 5.0 - 7.5   Color, UA Yellow Yellow   Appearance Ur Hazy (A) Clear   Leukocytes,UA Negative Negative   Protein,UA Trace (A) Negative/Trace   Glucose, UA Negative Negative   Ketones, UA Negative Negative   RBC, UA 2+ (A) Negative   Bilirubin, UA Negative Negative   Urobilinogen, Ur 1.0 0.2 - 1.0 mg/dL   Nitrite, UA Negative Negative   Microscopic Examination See below:   Microscopic Examination     Status: Abnormal   Collection Time: 08/07/21 10:52 AM   Urine  Result Value Ref Range   WBC, UA 0-5 0 - 5 /hpf   RBC 3-10 (A) 0 - 2 /hpf   Epithelial  Cells (non renal) 0-10 0 - 10 /hpf   Bacteria, UA Few None seen/Few  I-STAT creatinine     Status: None   Collection Time: 10/05/21  3:31 PM  Result Value Ref Range   Creatinine, Ser 1.00 0.61 - 1.24 mg/dL    Radiology CT ANGIO CHEST AORTA W/CM & OR WO/CM  Result Date: 10/06/2021 CLINICAL DATA:  Evidence by prior CTA aneurysmal dilatation of the ascending thoracic aorta. EXAM: CT ANGIOGRAPHY CHEST TECHNIQUE: Multidetector CT imaging through the chest was performed using the standard protocol during bolus administration of intravenous contrast. Multiplanar reconstructed images and MIPs were obtained and reviewed to evaluate the vascular anatomy. CONTRAST:  37mL OMNIPAQUE IOHEXOL 350 MG/ML SOLN COMPARISON:  CTA of the chest on 09/07/2020 FINDINGS: CTA CHEST FINDINGS Cardiovascular: The aortic root is of normal caliber measuring approximately 3.5-3.7 cm at the level of the sinuses of Valsalva. There is calcification and probable thickening of the aortic valve. The ascending thoracic aorta measures approximately 4-4.1 cm in maximum diameter and is minimally dilated. Prior measurement up to 4.4 cm was likely overestimated at a segment of tortuosity. The aortic arch measures 3.2 cm proximally and 2.6 cm distally. The descending thoracic aorta measures 2.5 cm. There is no evidence of aortic dissection. Visualized proximal great vessels demonstrate normal patency and normal branching anatomy. The heart size is within normal limits. Calcified plaque present in the distribution of the LAD and left circumflex coronary arteries. No pericardial fluid identified. Central pulmonary arteries are normal in caliber. Mediastinum/Nodes: No enlarged mediastinal, hilar, or axillary lymph nodes. Thyroid gland, trachea, and  esophagus demonstrate no significant findings. Lungs/Pleura: There is no evidence of pulmonary edema, consolidation, pneumothorax, nodule or pleural fluid. Musculoskeletal: No chest wall abnormality. No  acute or significant osseous findings. Upper abdomen: No significant findings in the visualized upper abdomen. Incidental paired bilateral renal artery supply. Review of the MIP images confirms the above findings. IMPRESSION: 1. The ascending thoracic aorta is only mildly dilated measuring approximately 4-4.1 cm in maximum diameter. Prior measurement of 4.4 cm was likely overestimated in a segment of tortuosity. There is associated calcification and probable thickening of the aortic valve and correlation with echocardiography would be helpful, if not already obtained, to evaluate aortic valve function and morphology. 2. Coronary atherosclerosis with calcified plaque in the distribution of the LAD and left circumflex coronary arteries. Electronically Signed   By: Aletta Edouard M.D.   On: 10/06/2021 09:28    Assessment/Plan HTN (hypertension) blood pressure control important in reducing the progression of atherosclerotic disease. On appropriate oral medications.     Type II diabetes mellitus with manifestations (HCC) blood glucose control important in reducing the progression of atherosclerotic disease. Also, involved in wound healing. On appropriate medications.     Pulmonary embolism (HCC) On coumadin.  He would be interested in moving to one of the newer agents once they are no longer cost prohibitive  Thoracic aortic aneurysm (TAA) ([place]) I have independently reviewed his CT scan of the chest.  His ascending thoracic aorta measures approximately 4.1 cm in maximal diameter which is slightly lower than it was last year which may have been a slight over estimate.  This is stable.  This can be checked every year or 2 going forward with a CT scan.  We will see him back in about a year and can discuss his anticoagulation regimen as well as follow-up his CT scan.    Leotis Pain, MD  10/10/2021 2:01 PM    This note was created with Dragon medical transcription system.  Any errors from dictation  are purely unintentional

## 2021-11-10 IMAGING — CT CT ANGIO CHEST
2 of 6 series · 13 of 36 positions shown · IV contrast (omnipaque)
Comparison: CTA of the chest on 09/07/2020

CLINICAL DATA: Evidence by prior CTA aneurysmal dilatation of the
ascending thoracic aorta.

EXAM:
CT ANGIOGRAPHY CHEST
TECHNIQUE: Multidetector CT imaging through the chest was performed using the
standard protocol during bolus administration of intravenous
contrast. Multiplanar reconstructed images and MIPs were obtained
and reviewed to evaluate the vascular anatomy.
CONTRAST:  75mL OMNIPAQUE IOHEXOL 350 MG/ML SOLN

[Series 4: axial arterial cta thorax 2.00 · axial · arterial · 0.75mm/px · z∈[-1231,-923]mm · 12 of 183 slices shown]
[im 15/183  lung]
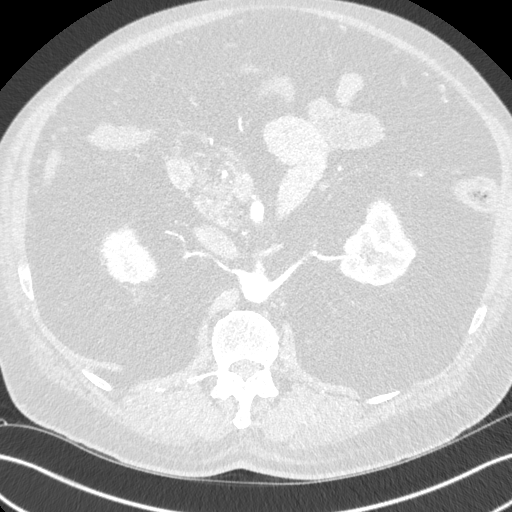
[im 29/183  mediastinal]
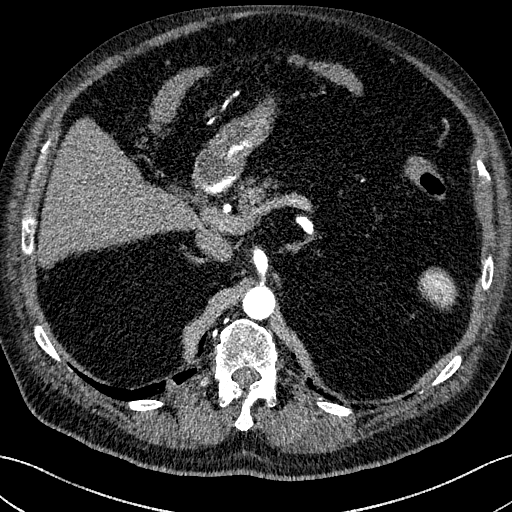
[im 43/183  lung]
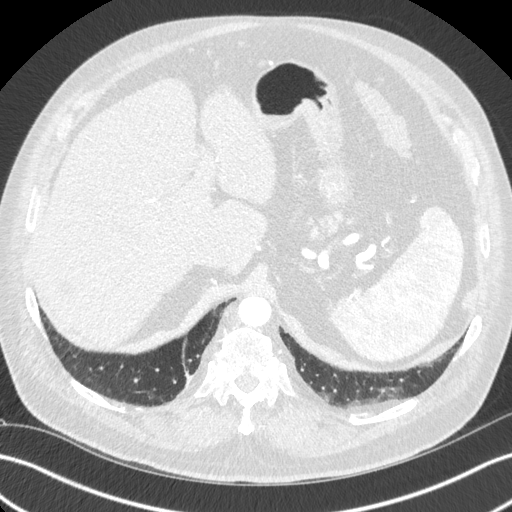
[im 57/183  mediastinal]
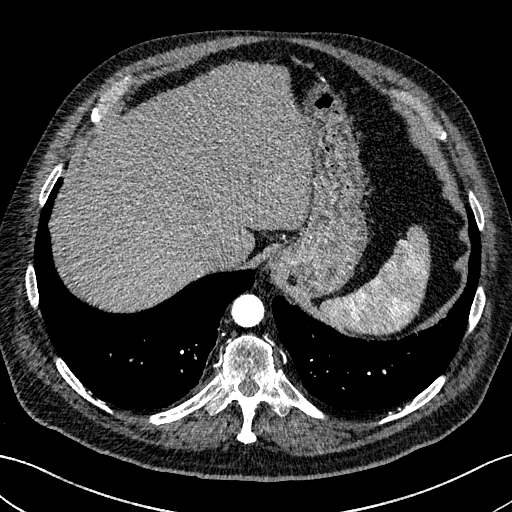
[im 71/183  lung]
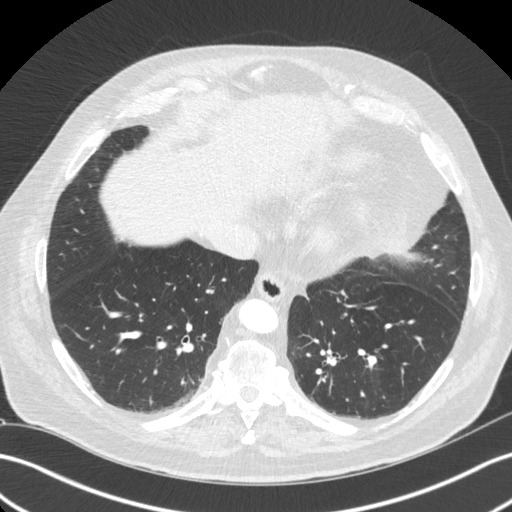
[im 85/183  mediastinal]
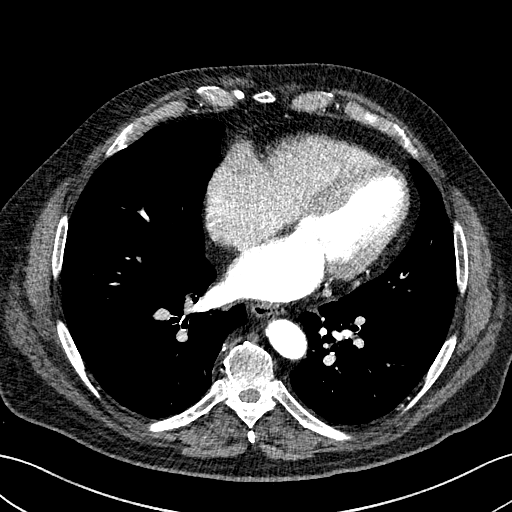
[im 99/183  lung]
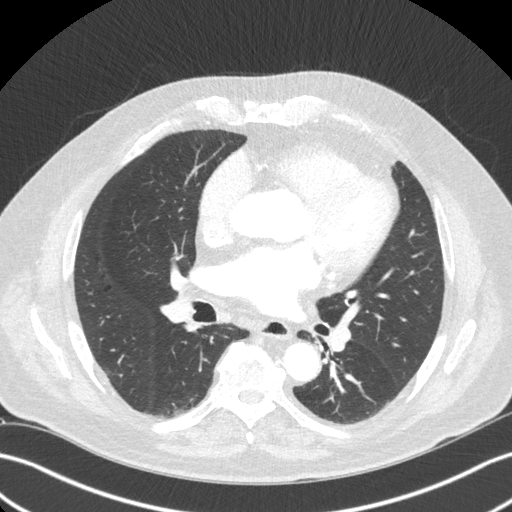
[im 113/183  mediastinal]
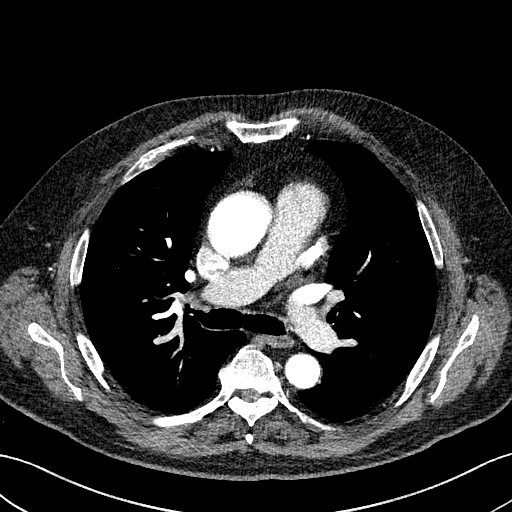
[im 127/183  lung]
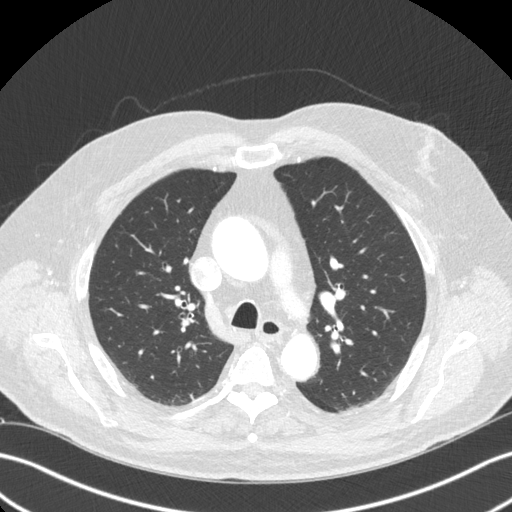
[im 141/183  mediastinal]
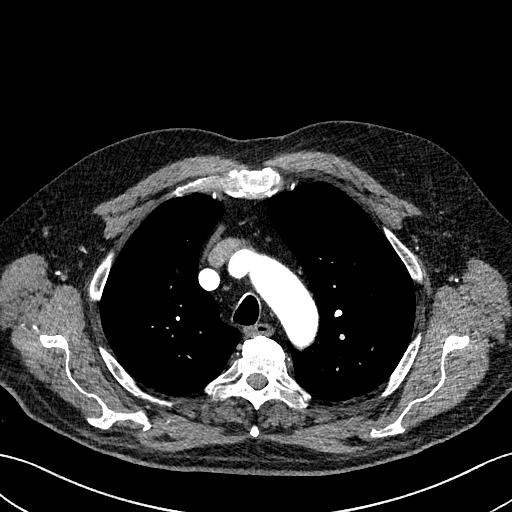
[im 155/183  lung]
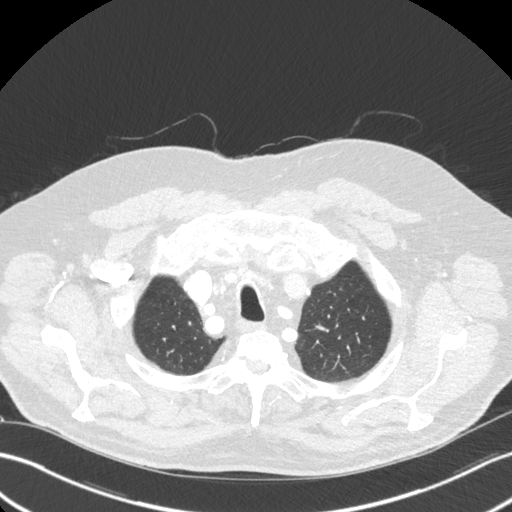
[im 169/183  mediastinal]
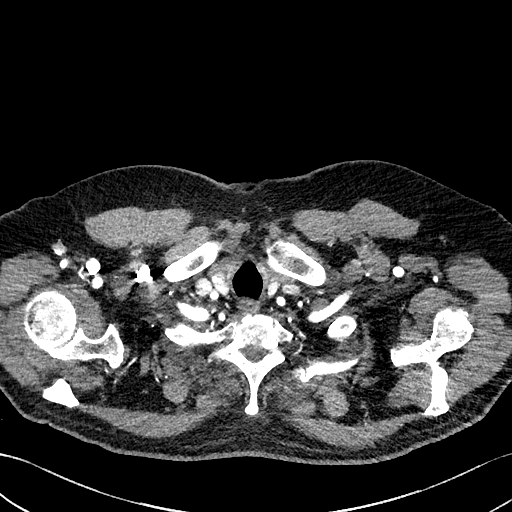

[Series 7: cor st cta thorax 2.00 cor · coronal · 0.72mm/px · 1 of 171 slices shown]
[im 86/171  mediastinal]
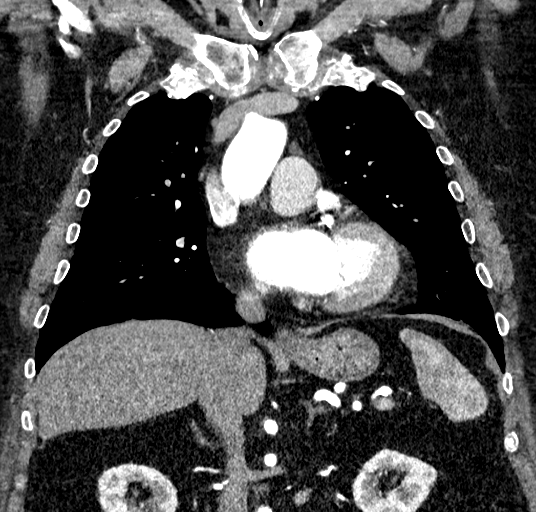

[13 of 36 positions shown; findings below may reference images not displayed]

FINDINGS: CTA CHEST FINDINGS

Cardiovascular: The aortic root is of normal caliber measuring
approximately 3.5-3.7 cm at the level of the sinuses of Valsalva.
There is calcification and probable thickening of the aortic valve.
The ascending thoracic aorta measures approximately 4-4.1 cm in
maximum diameter and is minimally dilated. Prior measurement up to
4.4 cm was likely overestimated at a segment of tortuosity. The
aortic arch measures 3.2 cm proximally and 2.6 cm distally. The
descending thoracic aorta measures 2.5 cm. There is no evidence of
aortic dissection. Visualized proximal great vessels demonstrate
normal patency and normal branching anatomy.

The heart size is within normal limits. Calcified plaque present in
the distribution of the LAD and left circumflex coronary arteries.
No pericardial fluid identified. Central pulmonary arteries are
normal in caliber.

Mediastinum/Nodes: No enlarged mediastinal, hilar, or axillary lymph
nodes. Thyroid gland, trachea, and esophagus demonstrate no
significant findings.

Lungs/Pleura: There is no evidence of pulmonary edema,
consolidation, pneumothorax, nodule or pleural fluid.

Musculoskeletal: No chest wall abnormality. No acute or significant
osseous findings.

Upper abdomen: No significant findings in the visualized upper
abdomen. Incidental paired bilateral renal artery supply.

Review of the MIP images confirms the above findings.
IMPRESSION: 1. The ascending thoracic aorta is only mildly dilated measuring
approximately 4-4.1 cm in maximum diameter. Prior measurement of
cm was likely overestimated in a segment of tortuosity. There is
associated calcification and probable thickening of the aortic valve
and correlation with echocardiography would be helpful, if not
already obtained, to evaluate aortic valve function and morphology.
2. Coronary atherosclerosis with calcified plaque in the
distribution of the LAD and left circumflex coronary arteries.

## 2022-01-03 ENCOUNTER — Other Ambulatory Visit: Payer: Self-pay

## 2022-01-03 ENCOUNTER — Inpatient Hospital Stay: Payer: Medicare Other | Attending: Radiation Oncology

## 2022-01-03 DIAGNOSIS — Z923 Personal history of irradiation: Secondary | ICD-10-CM | POA: Insufficient documentation

## 2022-01-03 DIAGNOSIS — C61 Malignant neoplasm of prostate: Secondary | ICD-10-CM | POA: Diagnosis not present

## 2022-01-03 LAB — PSA: Prostatic Specific Antigen: 0.05 ng/mL (ref 0.00–4.00)

## 2022-01-10 ENCOUNTER — Ambulatory Visit: Payer: Medicare Other | Admitting: Radiation Oncology

## 2022-02-09 ENCOUNTER — Telehealth: Payer: Self-pay | Admitting: Radiation Oncology

## 2022-02-09 NOTE — Telephone Encounter (Signed)
Pt called and stated he saw a diagnosis on his last lab visit in Jan. He was concerned about the diagnosis and stated he has a few questions and would like a clinical team member to give him a call back.

## 2022-02-09 NOTE — Telephone Encounter (Signed)
Returned call to patient, all questions were answered to his satisfaction.

## 2022-02-14 ENCOUNTER — Ambulatory Visit: Payer: Medicare Other | Admitting: Radiation Oncology

## 2022-02-19 ENCOUNTER — Ambulatory Visit
Admission: RE | Admit: 2022-02-19 | Discharge: 2022-02-19 | Disposition: A | Discharge status: Home / Self Care | Payer: Medicare Other | Source: Ambulatory Visit | Attending: Radiation Oncology | Admitting: Radiation Oncology

## 2022-02-19 ENCOUNTER — Encounter: Payer: Self-pay | Admitting: Radiation Oncology

## 2022-02-19 ENCOUNTER — Other Ambulatory Visit: Payer: Self-pay

## 2022-02-19 VITALS — BP 121/65 | HR 58 | Temp 97.8°F | Resp 18 | Ht 68.0 in | Wt 277.5 lb

## 2022-02-19 DIAGNOSIS — R609 Edema, unspecified: Secondary | ICD-10-CM | POA: Insufficient documentation

## 2022-02-19 DIAGNOSIS — C61 Malignant neoplasm of prostate: Secondary | ICD-10-CM | POA: Diagnosis not present

## 2022-02-19 DIAGNOSIS — C775 Secondary and unspecified malignant neoplasm of intrapelvic lymph nodes: Secondary | ICD-10-CM | POA: Insufficient documentation

## 2022-02-19 DIAGNOSIS — R350 Frequency of micturition: Secondary | ICD-10-CM | POA: Insufficient documentation

## 2022-02-19 DIAGNOSIS — Z923 Personal history of irradiation: Secondary | ICD-10-CM | POA: Insufficient documentation

## 2022-02-19 NOTE — Progress Notes (Signed)
Radiation Oncology ?Follow up Note ? ?Name: Jimmy Greer [facility]   ?Date:   02/19/2022 ?MRN:  [id number] ?DOB: 09-30-1941  ? ? ?This 81 y.o. 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 ? ?REFERRING PROVIDER: Idelle Crouch, MD ? ?HPI: Patient is a 81 year old male now at 3 years having a pleated IMRT radiation therapy to his prostate and pelvic nodes for stage IIb Gleason 8 adenocarcinoma presenting with a PSA of 8.6.  Seen today in routine follow-up he is doing fairly well recently was started on a new fluid pill which is causing some increased urinary frequency.  He is also consistently has persistent edema of his lower extremities some skin lesions which I believe are stasis changes not malignancy.  His bowels are fine.  His most recent PSA is 1.05.. ? ?COMPLICATIONS OF TREATMENT: none ? ?FOLLOW UP COMPLIANCE: keeps appointments  ? ?PHYSICAL EXAM:  ?BP 121/65   Pulse (!) 58   Temp 97.8 ?F (36.6 ?C)   Resp 18   Ht '5\' 8"'$  (1.727 m)   Wt 277 lb 8 oz (125.9 kg)   BMI 42.19 kg/m?  ?Well-developed well-nourished patient in NAD. HEENT reveals PERLA, EOMI, discs not visualized.  Oral cavity is clear. No oral mucosal lesions are identified. Neck is clear without evidence of cervical or supraclavicular adenopathy. Lungs are clear to A&P. Cardiac examination is essentially unremarkable with regular rate and rhythm without murmur rub or thrill. Abdomen is benign with no organomegaly or masses noted. Motor sensory and DTR levels are equal and symmetric in the upper and lower extremities. Cranial nerves II through XII are grossly intact. Proprioception is intact. No peripheral adenopathy or edema is identified. No motor or sensory levels are noted. Crude visual fields are within normal range. ? ?RADIOLOGY RESULTS: No current films for review ? ?PLAN: Present time patient is under excellent biochemical  control of his prostate cancer now 3 years out I am pleased with his overall progress.  Again I have informed him the lesions on his leg are not cancerous but mostly stasis changes secondary to his peripheral edema.  He continues follow-up with cardiology.  I have asked to see him back in 1 year for follow-up.  With a PSA at that time.Patient is to call with any concerns. ? ?I would like to take this opportunity to thank you for allowing me to participate in the care of your patient.. ?  ? Noreene Filbert, MD ? ?

## 2022-03-20 ENCOUNTER — Ambulatory Visit (INDEPENDENT_AMBULATORY_CARE_PROVIDER_SITE_OTHER): Payer: Medicare Other | Admitting: Nurse Practitioner

## 2022-03-20 ENCOUNTER — Encounter (INDEPENDENT_AMBULATORY_CARE_PROVIDER_SITE_OTHER): Payer: Self-pay | Admitting: Nurse Practitioner

## 2022-03-20 VITALS — BP 122/68 | HR 75 | Resp 16 | Wt 271.4 lb

## 2022-03-20 DIAGNOSIS — M7989 Other specified soft tissue disorders: Secondary | ICD-10-CM

## 2022-03-20 MED ORDER — CEPHALEXIN 500 MG PO CAPS: 500.0000 mg | ORAL_CAPSULE | Freq: Three times a day (TID) | ORAL | 0 refills | Status: DC

## 2022-03-27 ENCOUNTER — Encounter (INDEPENDENT_AMBULATORY_CARE_PROVIDER_SITE_OTHER): Payer: Self-pay

## 2022-03-27 ENCOUNTER — Ambulatory Visit (INDEPENDENT_AMBULATORY_CARE_PROVIDER_SITE_OTHER): Payer: Medicare Other | Admitting: Nurse Practitioner

## 2022-03-27 VITALS — BP 114/74 | HR 92 | Resp 16 | Wt 269.0 lb

## 2022-03-27 DIAGNOSIS — M7989 Other specified soft tissue disorders: Secondary | ICD-10-CM

## 2022-03-27 NOTE — Progress Notes (Signed)
History of Present Illness  There is no documented history at this time  Assessments & Plan   There are no diagnoses linked to this encounter.    Additional instructions  Subjective:  Patient presents with venous ulcer of the Bilateral lower extremity.    Procedure:  3 layer unna wrap was placed Bilateral lower extremity.   Plan:   Follow up in one week.  

## 2022-04-01 ENCOUNTER — Encounter (INDEPENDENT_AMBULATORY_CARE_PROVIDER_SITE_OTHER): Payer: Self-pay | Admitting: Nurse Practitioner

## 2022-04-01 NOTE — Progress Notes (Signed)
? ?Subjective:  ? ? Patient ID: Jimmy Greer, male    DOB: December 19, 1940, 81 y.o.   MRN: [id number] ?Chief Complaint  ?Patient presents with  ? Follow-up  ?  Ref [place]aker consult bilateral swelling  ? ? ?Jimmy Greer is a 81 y.o. that presents today for evaluation of bilateral lower extremity.  He takes torsemide as prescribed but despite this he still continues to struggle with leg swelling.  He notes some pain in his feet but this is consistent with his known plantar fasciitis.  He also has noted neuropathy. ? ? ? ?Review of Systems  ?Cardiovascular:  Positive for leg swelling.  ?All other systems reviewed and are negative. ? ?   ?Objective:  ? Physical Exam ?Vitals reviewed.  ?HENT:  ?   Head: Normocephalic.  ?Cardiovascular:  ?   Rate and Rhythm: Normal rate.  ?Pulmonary:  ?   Effort: Pulmonary effort is normal.  ?Musculoskeletal:  ?   Right lower leg: Edema present.  ?   Left lower leg: Edema present.  ?Skin: ?   General: Skin is warm and dry.  ?Neurological:  ?   Mental Status: He is alert and oriented to person, place, and time.  ?Psychiatric:     ?   Mood and Affect: Mood normal.     ?   Behavior: Behavior normal.     ?   Thought Content: Thought content normal.     ?   Judgment: Judgment normal.  ? ? ?BP 122/68 (BP Location: Right Arm)   Pulse 75   Resp 16   Wt 271 lb 6.4 oz (123.1 kg)   BMI 41.27 kg/m?  ? ?Past Medical History:  ?Diagnosis Date  ? Arthritis   ? Bleeding disorder ([place])   ? Cancer [facility])   ? Diabetes mellitus without complication ([place])   ? GERD (gastroesophageal reflux disease)   ? Hypertension   ? ? ?Social History  ? ?Socioeconomic History  ? Marital status: Married  ?  Spouse name: Not on file  ? Number of children: Not on file  ? Years of education: Not on file  ? Highest education level: Not on file  ?Occupational History  ? Not on file  ?Tobacco Use  ? Smoking status: Former  ? Smokeless tobacco: Never  ? Tobacco comments:  ?  quit 35 years  ?Vaping Use  ? Vaping Use: Never used   ?Substance and Sexual Activity  ? Alcohol use: Yes  ? Drug use: No  ? Sexual activity: Yes  ?Other Topics Concern  ? Not on file  ?Social History Narrative  ? Not on file  ? ?Social Determinants of Health  ? ?Financial Resource Strain: Not on file  ?Food Insecurity: Not on file  ?Transportation Needs: Not on file  ?Physical Activity: Not on file  ?Stress: Not on file  ?Social Connections: Not on file  ?Intimate Partner Violence: Not on file  ? ? ?Past Surgical History:  ?Procedure Laterality Date  ? APPENDECTOMY    ? CARPAL TUNNEL RELEASE    ? COLONOSCOPY WITH PROPOFOL N/A 09/25/2017  ? Procedure: COLONOSCOPY WITH PROPOFOL;  Surgeon: Toledo, Benay Pike, MD;  Location: [facility] ENDOSCOPY;  Service: Endoscopy;  Laterality: N/A;  ? ESOPHAGOGASTRODUODENOSCOPY (EGD) WITH PROPOFOL N/A 09/25/2017  ? Procedure: ESOPHAGOGASTRODUODENOSCOPY (EGD) WITH PROPOFOL;  Surgeon: Toledo, Benay Pike, MD;  Location: [facility] ENDOSCOPY;  Service: Endoscopy;  Laterality: N/A;  ? GALLBLADDER SURGERY    ? REPLACEMENT TOTAL KNEE Bilateral   ? ? ?  Family History  ?Problem Relation Age of Onset  ? Prostate cancer Brother   ? Bladder Cancer Neg Hx   ? Kidney cancer Neg Hx   ? ? ?No Known Allergies ? ? ?  Latest Ref Rng & Units 01/07/2013  ?  4:52 AM 01/06/2013  ?  4:43 AM 12/23/2012  ?  2:03 PM  ?CBC  ?WBC 3.8 - 10.6 x10 3/mm 3   3.9    ?Hemoglobin 13.0 - 18.0 g/dL 9.0   9.6   11.5    ?Hematocrit 40.0 - 52.0 %   34.6    ?Platelets 150 - 440 x10 3/mm 3 148   160   226    ? ? ? ? ?CMP  ?   ?Component Value Date/Time  ? NA 137 01/07/2013 0452  ? K 3.8 01/07/2013 0452  ? CL 105 01/07/2013 0452  ? CO2 25 01/07/2013 0452  ? GLUCOSE 117 (H) 01/07/2013 0452  ? BUN 11 01/07/2013 0452  ? CREATININE 1.00 10/05/2021 1531  ? CREATININE 0.90 01/07/2013 0452  ? CALCIUM 7.9 (L) 01/07/2013 0452  ? PROT 7.0 03/06/2012 0453  ? ALBUMIN 3.7 03/06/2012 0453  ? AST 33 03/06/2012 0453  ? ALT 23 03/06/2012 0453  ? ALKPHOS 65 03/06/2012 0453  ? BILITOT 0.4 03/06/2012 0453  ? GFRNONAA  >60 01/07/2013 0452  ? GFRAA >60 01/07/2013 0452  ? ? ? ?No results found. ? ?   ?Assessment & Plan:  ? ?1. Leg swelling ?No surgery or intervention at this point in time.   ? ?I have had a long discussion with the patient regarding venous insufficiency and why it  causes symptoms, specifically venous ulceration . I have discussed with the patient the chronic skin changes that accompany venous insufficiency and the long term sequela such as infection and recurring  ulceration.  Patient will be placed in [employer] which will be changed weekly drainage permitting. ? ?In addition, behavioral modification including several periods of elevation of the lower extremities during the day will be continued. Achieving a position with the ankles at heart level was stressed to the patient ? ?The patient is instructed to begin routine exercise, especially walking on a daily basis ?We will have the patient undergo 4 weeks in wraps we will follow-up in 4 weeks to determine progress with edema. ? ?Current Outpatient Medications on File Prior to Visit  ?Medication Sig Dispense Refill  ? allopurinol (ZYLOPRIM) 300 MG tablet TAKE 1 TABLET ONE TIME DAILY    ? gabapentin (NEURONTIN) 600 MG tablet TAKE 1 TABLET THREE TIMES DAILY    ? glimepiride (AMARYL) 2 MG tablet Take 2 mg by mouth daily with breakfast.    ? hydrochlorothiazide (HYDRODIURIL) 25 MG tablet Take by mouth.    ? HYDROcodone-acetaminophen (NORCO/VICODIN) 5-325 MG tablet 1 po q6h prn    ? meloxicam (MOBIC) 7.5 MG tablet Take 7.5 mg by mouth daily.    ? metFORMIN (GLUCOPHAGE) 1000 MG tablet Take 1,000 mg by mouth 2 (two) times daily with a meal.    ? methocarbamol (ROBAXIN) 750 MG tablet Take 750 mg by mouth 2 (two) times daily.    ? Multiple Vitamin (MULTI-VITAMINS) TABS Take by mouth.    ? omeprazole (PRILOSEC) 40 MG capsule TAKE 1 CAPSULE TWICE DAILY    ? pioglitazone (ACTOS) 30 MG tablet Take 30 mg by mouth daily.    ? sildenafil (REVATIO) 20 MG tablet 1 tablet daily 90  tablet 3  ? simvastatin (ZOCOR) 40  MG tablet TAKE 1 TABLET EVERY NIGHT    ? tamsulosin (FLOMAX) 0.4 MG CAPS capsule TAKE 1 CAPSULE TWICE DAILY    ? torsemide (DEMADEX) 20 MG tablet Take by mouth.    ? traMADol (ULTRAM) 50 MG tablet Take by mouth every 6 (six) hours as needed.    ? aspirin EC 81 MG tablet Take by mouth. (Patient not taking: Reported on 08/07/2021)    ? ciprofloxacin (CIPRO) 250 MG tablet Take 1 tablet (250 mg total) by mouth 2 (two) times daily. (Patient not taking: Reported on 09/19/2021) 6 tablet 0  ? warfarin (COUMADIN) 3 MG tablet Take 3 mg by mouth. Five days per week (Patient not taking: Reported on 08/07/2021)    ? warfarin (COUMADIN) 5 MG tablet Take by mouth. 2 days per week (Patient not taking: Reported on 08/07/2021)    ? ?No current facility-administered medications on file prior to visit.  ? ? ?There are no Patient Instructions on file for this visit. ?No follow-ups on file. ? ? ?Kris Hartmann, NP ? ? ?

## 2022-04-02 ENCOUNTER — Encounter (INDEPENDENT_AMBULATORY_CARE_PROVIDER_SITE_OTHER): Payer: Self-pay | Admitting: Nurse Practitioner

## 2022-04-03 ENCOUNTER — Encounter (INDEPENDENT_AMBULATORY_CARE_PROVIDER_SITE_OTHER): Payer: Self-pay | Admitting: Nurse Practitioner

## 2022-04-03 ENCOUNTER — Ambulatory Visit (INDEPENDENT_AMBULATORY_CARE_PROVIDER_SITE_OTHER): Payer: Medicare Other | Admitting: Nurse Practitioner

## 2022-04-03 VITALS — BP 135/72 | HR 73 | Resp 16 | Wt 265.0 lb

## 2022-04-03 DIAGNOSIS — M7989 Other specified soft tissue disorders: Secondary | ICD-10-CM | POA: Diagnosis not present

## 2022-04-03 NOTE — Progress Notes (Signed)
History of Present Illness  There is no documented history at this time  Assessments & Plan   There are no diagnoses linked to this encounter.    Additional instructions  Subjective:  Patient presents with venous ulcer of the Bilateral lower extremity.    Procedure:  3 layer unna wrap was placed Bilateral lower extremity.   Plan:   Follow up in one week.  

## 2022-04-10 ENCOUNTER — Encounter (INDEPENDENT_AMBULATORY_CARE_PROVIDER_SITE_OTHER): Payer: Self-pay

## 2022-04-10 ENCOUNTER — Ambulatory Visit (INDEPENDENT_AMBULATORY_CARE_PROVIDER_SITE_OTHER): Payer: Medicare Other | Admitting: Nurse Practitioner

## 2022-04-10 VITALS — BP 112/70 | HR 54 | Resp 18 | Ht 67.5 in | Wt 266.8 lb

## 2022-04-10 DIAGNOSIS — M7989 Other specified soft tissue disorders: Secondary | ICD-10-CM | POA: Diagnosis not present

## 2022-04-10 NOTE — Progress Notes (Signed)
History of Present Illness  There is no documented history at this time  Assessments & Plan   There are no diagnoses linked to this encounter.    Additional instructions  Subjective:  Patient presents with venous ulcer of the Bilateral lower extremity.    Procedure:  3 layer unna wrap was placed Bilateral lower extremity.   Plan:   Follow up in one week.  

## 2022-04-15 ENCOUNTER — Encounter (INDEPENDENT_AMBULATORY_CARE_PROVIDER_SITE_OTHER): Payer: Self-pay | Admitting: Nurse Practitioner

## 2022-04-17 ENCOUNTER — Encounter (INDEPENDENT_AMBULATORY_CARE_PROVIDER_SITE_OTHER): Payer: Self-pay | Admitting: Nurse Practitioner

## 2022-04-17 ENCOUNTER — Ambulatory Visit (INDEPENDENT_AMBULATORY_CARE_PROVIDER_SITE_OTHER): Payer: Medicare Other | Admitting: Nurse Practitioner

## 2022-04-17 VITALS — BP 117/68 | HR 78 | Resp 17 | Ht 67.5 in | Wt 267.0 lb

## 2022-04-17 DIAGNOSIS — M7989 Other specified soft tissue disorders: Secondary | ICD-10-CM | POA: Diagnosis not present

## 2022-04-17 DIAGNOSIS — I10 Essential (primary) hypertension: Secondary | ICD-10-CM

## 2022-04-24 ENCOUNTER — Ambulatory Visit (INDEPENDENT_AMBULATORY_CARE_PROVIDER_SITE_OTHER): Payer: Medicare Other | Admitting: Nurse Practitioner

## 2022-04-24 ENCOUNTER — Encounter (INDEPENDENT_AMBULATORY_CARE_PROVIDER_SITE_OTHER): Payer: Self-pay | Admitting: Nurse Practitioner

## 2022-04-24 VITALS — BP 101/63 | HR 94 | Resp 16 | Wt 266.0 lb

## 2022-04-24 DIAGNOSIS — M7989 Other specified soft tissue disorders: Secondary | ICD-10-CM | POA: Diagnosis not present

## 2022-04-24 NOTE — Progress Notes (Signed)
History of Present Illness  There is no documented history at this time  Assessments & Plan   There are no diagnoses linked to this encounter.    Additional instructions  Subjective:  Patient presents with venous ulcer of the Bilateral lower extremity.    Procedure:  3 layer unna wrap was placed Bilateral lower extremity.   Plan:   Follow up in one week.  

## 2022-04-29 ENCOUNTER — Encounter (INDEPENDENT_AMBULATORY_CARE_PROVIDER_SITE_OTHER): Payer: Self-pay | Admitting: Nurse Practitioner

## 2022-04-29 NOTE — Progress Notes (Signed)
? ?Subjective:  ? ? Patient ID: Jimmy Greer, male    DOB: 12/26/1940, 81 y.o.   MRN: [id number] ?No chief complaint on file. ? ? ?Patient has been in [place] wraps for the last several weeks and been tolerating them well.  The swelling is improved however there is still some wounds that need improvement.  He is tolerating the Unna wraps well. ? ? ?Review of Systems  ?Cardiovascular:  Positive for leg swelling.  ?All other systems reviewed and are negative. ? ?   ?Objective:  ? Physical Exam ?Vitals reviewed.  ?HENT:  ?   Head: Normocephalic.  ?Cardiovascular:  ?   Rate and Rhythm: Normal rate.  ?Pulmonary:  ?   Effort: Pulmonary effort is normal.  ?Musculoskeletal:  ?   Right lower leg: Edema present.  ?   Left lower leg: Edema present.  ?Neurological:  ?   Mental Status: He is alert and oriented to person, place, and time.  ?Psychiatric:     ?   Mood and Affect: Mood normal.     ?   Thought Content: Thought content normal.     ?   Judgment: Judgment normal.  ? ? ?BP 117/68 (BP Location: Left Arm)   Pulse 78   Resp 17   Ht 5' 7.5" (1.715 m)   Wt 267 lb (121.1 kg)   BMI 41.20 kg/m?  ? ?Past Medical History:  ?Diagnosis Date  ? Arthritis   ? Bleeding disorder ([place])   ? Cancer [facility])   ? Diabetes mellitus without complication ([place])   ? GERD (gastroesophageal reflux disease)   ? Hypertension   ? ? ?Social History  ? ?Socioeconomic History  ? Marital status: Married  ?  Spouse name: Not on file  ? Number of children: Not on file  ? Years of education: Not on file  ? Highest education level: Not on file  ?Occupational History  ? Not on file  ?Tobacco Use  ? Smoking status: Former  ? Smokeless tobacco: Never  ? Tobacco comments:  ?  quit 35 years  ?Vaping Use  ? Vaping Use: Never used  ?Substance and Sexual Activity  ? Alcohol use: Yes  ? Drug use: No  ? Sexual activity: Yes  ?Other Topics Concern  ? Not on file  ?Social History Narrative  ? Not on file  ? ?Social Determinants of Health  ? ?Financial Resource Strain:  Not on file  ?Food Insecurity: Not on file  ?Transportation Needs: Not on file  ?Physical Activity: Not on file  ?Stress: Not on file  ?Social Connections: Not on file  ?Intimate Partner Violence: Not on file  ? ? ?Past Surgical History:  ?Procedure Laterality Date  ? APPENDECTOMY    ? CARPAL TUNNEL RELEASE    ? COLONOSCOPY WITH PROPOFOL N/A 09/25/2017  ? Procedure: COLONOSCOPY WITH PROPOFOL;  Surgeon: Toledo, Benay Pike, MD;  Location: [facility] ENDOSCOPY;  Service: Endoscopy;  Laterality: N/A;  ? ESOPHAGOGASTRODUODENOSCOPY (EGD) WITH PROPOFOL N/A 09/25/2017  ? Procedure: ESOPHAGOGASTRODUODENOSCOPY (EGD) WITH PROPOFOL;  Surgeon: Toledo, Benay Pike, MD;  Location: [facility] ENDOSCOPY;  Service: Endoscopy;  Laterality: N/A;  ? GALLBLADDER SURGERY    ? REPLACEMENT TOTAL KNEE Bilateral   ? ? ?Family History  ?Problem Relation Age of Onset  ? Prostate cancer Brother   ? Bladder Cancer Neg Hx   ? Kidney cancer Neg Hx   ? ? ?No Known Allergies ? ? ?  Latest Ref Rng & Units 01/07/2013  ?  4:52 AM 01/06/2013  ?  4:43 AM 12/23/2012  ?  2:03 PM  ?CBC  ?WBC 3.8 - 10.6 x10 3/mm 3   3.9    ?Hemoglobin 13.0 - 18.0 g/dL 9.0   9.6   11.5    ?Hematocrit 40.0 - 52.0 %   34.6    ?Platelets 150 - 440 x10 3/mm 3 148   160   226    ? ? ? ? ?CMP  ?   ?Component Value Date/Time  ? NA 137 01/07/2013 0452  ? K 3.8 01/07/2013 0452  ? CL 105 01/07/2013 0452  ? CO2 25 01/07/2013 0452  ? GLUCOSE 117 (H) 01/07/2013 0452  ? BUN 11 01/07/2013 0452  ? CREATININE 1.00 10/05/2021 1531  ? CREATININE 0.90 01/07/2013 0452  ? CALCIUM 7.9 (L) 01/07/2013 0452  ? PROT 7.0 03/06/2012 0453  ? ALBUMIN 3.7 03/06/2012 0453  ? AST 33 03/06/2012 0453  ? ALT 23 03/06/2012 0453  ? ALKPHOS 65 03/06/2012 0453  ? BILITOT 0.4 03/06/2012 0453  ? GFRNONAA >60 01/07/2013 0452  ? GFRAA >60 01/07/2013 0452  ? ? ? ?No results found. ? ?   ?Assessment & Plan:  ? ?1. Leg swelling ?The patient will continue with Unna wraps.  We will continue for the next 4 weeks and the patient present to the  office for reevaluation of lower extremity swelling. ? ?2. Primary hypertension ?Continue antihypertensive medications as already ordered, these medications have been reviewed and there are no changes at this time.  ? ? ?Current Outpatient Medications on File Prior to Visit  ?Medication Sig Dispense Refill  ? allopurinol (ZYLOPRIM) 300 MG tablet TAKE 1 TABLET ONE TIME DAILY    ? aspirin EC 81 MG tablet Take by mouth.    ? gabapentin (NEURONTIN) 600 MG tablet TAKE 1 TABLET THREE TIMES DAILY    ? glimepiride (AMARYL) 2 MG tablet Take 2 mg by mouth daily with breakfast.    ? glucose blood ([employer] BLOOD GLUCOSE TEST) test strip 1 each (1 strip total) once daily    ? hydrochlorothiazide (HYDRODIURIL) 25 MG tablet Take by mouth.    ? HYDROcodone-acetaminophen (NORCO/VICODIN) 5-325 MG tablet 1 po q6h prn    ? meloxicam (MOBIC) 7.5 MG tablet Take 7.5 mg by mouth daily.    ? metFORMIN (GLUCOPHAGE) 1000 MG tablet Take 1,000 mg by mouth 2 (two) times daily with a meal.    ? methocarbamol (ROBAXIN) 750 MG tablet Take 750 mg by mouth 2 (two) times daily.    ? metoprolol succinate (TOPROL-XL) 25 MG 24 hr tablet Take 25 mg by mouth daily.    ? Multiple Vitamin (MULTI-VITAMINS) TABS Take by mouth.    ? omeprazole (PRILOSEC) 40 MG capsule TAKE 1 CAPSULE TWICE DAILY    ? ONETOUCH VERIO test strip daily.    ? pioglitazone (ACTOS) 30 MG tablet Take 30 mg by mouth daily.    ? sildenafil (REVATIO) 20 MG tablet 1 tablet daily 90 tablet 3  ? simvastatin (ZOCOR) 40 MG tablet TAKE 1 TABLET EVERY NIGHT    ? simvastatin (ZOCOR) 40 MG tablet Take 1 tablet by mouth at bedtime.    ? tamsulosin (FLOMAX) 0.4 MG CAPS capsule TAKE 1 CAPSULE TWICE DAILY    ? torsemide (DEMADEX) 20 MG tablet Take by mouth.    ? traMADol (ULTRAM) 50 MG tablet Take by mouth every 6 (six) hours as needed.    ? warfarin (COUMADIN) 3 MG tablet Take 3 mg by  mouth. Five days per week    ? warfarin (COUMADIN) 5 MG tablet Take by mouth. 2 days per week    ? ?No current  facility-administered medications on file prior to visit.  ? ? ?There are no Patient Instructions on file for this visit. ?No follow-ups on file. ? ? ?Kris Hartmann, NP ? ? ?

## 2022-05-01 ENCOUNTER — Encounter (INDEPENDENT_AMBULATORY_CARE_PROVIDER_SITE_OTHER): Payer: Self-pay

## 2022-05-01 ENCOUNTER — Ambulatory Visit (INDEPENDENT_AMBULATORY_CARE_PROVIDER_SITE_OTHER): Payer: Medicare Other | Admitting: Nurse Practitioner

## 2022-05-01 VITALS — BP 124/71 | HR 76 | Resp 16 | Ht 68.0 in | Wt 261.0 lb

## 2022-05-01 DIAGNOSIS — M7989 Other specified soft tissue disorders: Secondary | ICD-10-CM

## 2022-05-01 NOTE — Progress Notes (Signed)
History of Present Illness  There is no documented history at this time  Assessments & Plan   There are no diagnoses linked to this encounter.    Additional instructions  Subjective:  Patient presents with venous ulcer of the Bilateral lower extremity.    Procedure:  3 layer unna wrap was placed Bilateral lower extremity.   Plan:   Follow up in one week.  

## 2022-05-05 ENCOUNTER — Encounter (INDEPENDENT_AMBULATORY_CARE_PROVIDER_SITE_OTHER): Payer: Self-pay | Admitting: Nurse Practitioner

## 2022-05-07 ENCOUNTER — Encounter (INDEPENDENT_AMBULATORY_CARE_PROVIDER_SITE_OTHER): Payer: Self-pay | Admitting: Nurse Practitioner

## 2022-05-08 ENCOUNTER — Ambulatory Visit (INDEPENDENT_AMBULATORY_CARE_PROVIDER_SITE_OTHER): Payer: Medicare Other | Admitting: Nurse Practitioner

## 2022-05-08 ENCOUNTER — Encounter (INDEPENDENT_AMBULATORY_CARE_PROVIDER_SITE_OTHER): Payer: Self-pay

## 2022-05-08 VITALS — BP 116/64 | HR 86 | Resp 17 | Ht 68.0 in | Wt 268.0 lb

## 2022-05-08 DIAGNOSIS — M7989 Other specified soft tissue disorders: Secondary | ICD-10-CM

## 2022-05-08 NOTE — Progress Notes (Signed)
History of Present Illness  There is no documented history at this time  Assessments & Plan   There are no diagnoses linked to this encounter.    Additional instructions  Subjective:  Patient presents with venous ulcer of the Bilateral lower extremity.    Procedure:  3 layer unna wrap was placed Bilateral lower extremity.   Plan:   Follow up in one week.  

## 2022-05-14 ENCOUNTER — Encounter (INDEPENDENT_AMBULATORY_CARE_PROVIDER_SITE_OTHER): Payer: Self-pay | Admitting: Nurse Practitioner

## 2022-05-15 ENCOUNTER — Encounter (INDEPENDENT_AMBULATORY_CARE_PROVIDER_SITE_OTHER): Payer: Self-pay

## 2022-05-15 ENCOUNTER — Ambulatory Visit (INDEPENDENT_AMBULATORY_CARE_PROVIDER_SITE_OTHER): Payer: Medicare Other | Admitting: Nurse Practitioner

## 2022-05-15 VITALS — BP 114/76 | HR 84 | Resp 17

## 2022-05-15 DIAGNOSIS — M7989 Other specified soft tissue disorders: Secondary | ICD-10-CM

## 2022-05-15 NOTE — Progress Notes (Signed)
History of Present Illness  There is no documented history at this time  Assessments & Plan   There are no diagnoses linked to this encounter.    Additional instructions  Subjective:  Patient presents with venous ulcer of the Bilateral lower extremity.    Procedure:  3 layer unna wrap was placed Bilateral lower extremity.   Plan:   Follow up in one week.  

## 2022-05-21 ENCOUNTER — Encounter (INDEPENDENT_AMBULATORY_CARE_PROVIDER_SITE_OTHER): Payer: Self-pay | Admitting: Nurse Practitioner

## 2022-05-22 ENCOUNTER — Ambulatory Visit (INDEPENDENT_AMBULATORY_CARE_PROVIDER_SITE_OTHER): Payer: Medicare Other | Admitting: Nurse Practitioner

## 2022-05-22 ENCOUNTER — Encounter (INDEPENDENT_AMBULATORY_CARE_PROVIDER_SITE_OTHER): Payer: Self-pay | Admitting: Nurse Practitioner

## 2022-05-22 VITALS — BP 124/79 | HR 80 | Resp 16 | Ht 68.0 in | Wt 266.0 lb

## 2022-05-22 DIAGNOSIS — I10 Essential (primary) hypertension: Secondary | ICD-10-CM | POA: Diagnosis not present

## 2022-05-22 DIAGNOSIS — M7989 Other specified soft tissue disorders: Secondary | ICD-10-CM

## 2022-05-22 DIAGNOSIS — E785 Hyperlipidemia, unspecified: Secondary | ICD-10-CM

## 2022-05-28 NOTE — Progress Notes (Signed)
Subjective:    Patient ID: Jimmy Greer, male    DOB: 14-Jan-1941, 81 y.o.   MRN: [id number] No chief complaint on file.   Jimmy Greer is a 81 year old male who returns today for follow-up evaluation after several weeks milligrams.  The patient notes that the legs are doing much better swelling wise.  There is no open wounds or ulcerations.  He currently denies any signs or symptoms of cellulitis.  He denies any fevers or chills.    Review of Systems  Cardiovascular:  Positive for leg swelling.  All other systems reviewed and are negative.      Objective:   Physical Exam Vitals reviewed.  HENT:     Head: Normocephalic.  Cardiovascular:     Rate and Rhythm: Normal rate.     Pulses: Normal pulses.  Pulmonary:     Effort: Pulmonary effort is normal.  Skin:    General: Skin is dry.  Neurological:     Mental Status: He is alert and oriented to person, place, and time.  Psychiatric:        Mood and Affect: Mood normal.        Behavior: Behavior normal.        Thought Content: Thought content normal.        Judgment: Judgment normal.     BP 124/79 (BP Location: Right Arm)   Pulse 80   Resp 16   Ht '5\' 8"'$  (1.727 m)   Wt 266 lb (120.7 kg)   BMI 40.45 kg/m   Past Medical History:  Diagnosis Date   Arthritis    Bleeding disorder ([facility])    Cancer ([facility])    Diabetes mellitus without complication ([facility])    GERD (gastroesophageal reflux disease)    Hypertension     Social History   Socioeconomic History   Marital status: Married    Spouse name: Not on file   Number of children: Not on file   Years of education: Not on file   Highest education level: Not on file  Occupational History   Not on file  Tobacco Use   Smoking status: Former   Smokeless tobacco: Never   Tobacco comments:    quit 35 years  Vaping Use   Vaping Use: Never used  Substance and Sexual Activity   Alcohol use: Yes   Drug use: No   Sexual activity: Yes  Other Topics Concern   Not on  file  Social History Narrative   Not on file   Social Determinants of Health   Financial Resource Strain: Not on file  Food Insecurity: Not on file  Transportation Needs: Not on file  Physical Activity: Not on file  Stress: Not on file  Social Connections: Not on file  Intimate Partner Violence: Not on file    Past Surgical History:  Procedure Laterality Date   APPENDECTOMY     CARPAL TUNNEL RELEASE     COLONOSCOPY WITH PROPOFOL N/A 09/25/2017   Procedure: COLONOSCOPY WITH PROPOFOL;  Surgeon: Toledo, Benay Pike, MD;  Location: [facility] ENDOSCOPY;  Service: Endoscopy;  Laterality: N/A;   ESOPHAGOGASTRODUODENOSCOPY (EGD) WITH PROPOFOL N/A 09/25/2017   Procedure: ESOPHAGOGASTRODUODENOSCOPY (EGD) WITH PROPOFOL;  Surgeon: Toledo, Benay Pike, MD;  Location: [facility] ENDOSCOPY;  Service: Endoscopy;  Laterality: N/A;   GALLBLADDER SURGERY     REPLACEMENT TOTAL KNEE Bilateral     Family History  Problem Relation Age of Onset   Prostate cancer Brother    Bladder Cancer Neg Hx  Kidney cancer Neg Hx     No Known Allergies     Latest Ref Rng & Units 01/07/2013    4:52 AM 01/06/2013    4:43 AM 12/23/2012    2:03 PM  CBC  WBC 3.8 - 10.6 x10 3/mm 3   3.9   Hemoglobin 13.0 - 18.0 g/dL 9.0  9.6  11.5   Hematocrit 40.0 - 52.0 %   34.6   Platelets 150 - 440 x10 3/mm 3 148  160  226       CMP     Component Value Date/Time   NA 137 01/07/2013 0452   K 3.8 01/07/2013 0452   CL 105 01/07/2013 0452   CO2 25 01/07/2013 0452   GLUCOSE 117 (H) 01/07/2013 0452   BUN 11 01/07/2013 0452   CREATININE 1.00 10/05/2021 1531   CREATININE 0.90 01/07/2013 0452   CALCIUM 7.9 (L) 01/07/2013 0452   PROT 7.0 03/06/2012 0453   ALBUMIN 3.7 03/06/2012 0453   AST 33 03/06/2012 0453   ALT 23 03/06/2012 0453   ALKPHOS 65 03/06/2012 0453   BILITOT 0.4 03/06/2012 0453   GFRNONAA >60 01/07/2013 0452   GFRAA >60 01/07/2013 0452     No results found.     Assessment & Plan:   1. Leg swelling Currently  the patient has been doing well with [place] wraps.  We will take him out of wraps today and transition to conservative compression therapy.  We discussed compression socks in addition to compression wraps.  Patient is advised to elevate his lower extremity when he is not active.  Return in 6 weeks to evaluate progress.  2. Primary hypertension Continue antihypertensive medications as already ordered, these medications have been reviewed and there are no changes at this time.   3. Hyperlipidemia, unspecified hyperlipidemia type Continue statin as ordered and reviewed, no changes at this time    Current Outpatient Medications on File Prior to Visit  Medication Sig Dispense Refill   allopurinol (ZYLOPRIM) 300 MG tablet TAKE 1 TABLET ONE TIME DAILY     aspirin EC 81 MG tablet Take by mouth.     gabapentin (NEURONTIN) 600 MG tablet TAKE 1 TABLET THREE TIMES DAILY     glimepiride (AMARYL) 2 MG tablet Take 2 mg by mouth daily with breakfast.     glucose blood ([employer] BLOOD GLUCOSE TEST) test strip 1 each (1 strip total) once daily     hydrochlorothiazide (HYDRODIURIL) 25 MG tablet Take by mouth.     HYDROcodone-acetaminophen (NORCO/VICODIN) 5-325 MG tablet 1 po q6h prn     meloxicam (MOBIC) 7.5 MG tablet Take 7.5 mg by mouth daily.     metFORMIN (GLUCOPHAGE) 1000 MG tablet Take 1,000 mg by mouth 2 (two) times daily with a meal.     methocarbamol (ROBAXIN) 750 MG tablet Take 750 mg by mouth 2 (two) times daily.     metoprolol succinate (TOPROL-XL) 25 MG 24 hr tablet Take 25 mg by mouth daily.     Multiple Vitamin (MULTI-VITAMINS) TABS Take by mouth.     omeprazole (PRILOSEC) 40 MG capsule TAKE 1 CAPSULE TWICE DAILY     ONETOUCH VERIO test strip daily.     pioglitazone (ACTOS) 30 MG tablet Take 30 mg by mouth daily.     sildenafil (REVATIO) 20 MG tablet 1 tablet daily 90 tablet 3   simvastatin (ZOCOR) 40 MG tablet TAKE 1 TABLET EVERY NIGHT     simvastatin (ZOCOR) 40 MG tablet Take 1 tablet  by  mouth at bedtime.     tamsulosin (FLOMAX) 0.4 MG CAPS capsule TAKE 1 CAPSULE TWICE DAILY     torsemide (DEMADEX) 20 MG tablet Take by mouth.     traMADol (ULTRAM) 50 MG tablet Take by mouth every 6 (six) hours as needed.     warfarin (COUMADIN) 3 MG tablet Take 3 mg by mouth. Five days per week     warfarin (COUMADIN) 5 MG tablet Take by mouth. 2 days per week     No current facility-administered medications on file prior to visit.    There are no Patient Instructions on file for this visit. No follow-ups on file.   Kris Hartmann, NP

## 2022-05-29 ENCOUNTER — Encounter (INDEPENDENT_AMBULATORY_CARE_PROVIDER_SITE_OTHER): Payer: Self-pay | Admitting: Nurse Practitioner

## 2022-06-21 ENCOUNTER — Other Ambulatory Visit: Payer: Self-pay

## 2022-06-21 DIAGNOSIS — C61 Malignant neoplasm of prostate: Secondary | ICD-10-CM

## 2022-06-29 ENCOUNTER — Other Ambulatory Visit: Payer: Medicare Other

## 2022-06-29 DIAGNOSIS — C61 Malignant neoplasm of prostate: Secondary | ICD-10-CM

## 2022-06-30 LAB — PSA: Prostate Specific Ag, Serum: <0.1 ng/mL (ref 0.0–4.0)

## 2022-07-03 ENCOUNTER — Ambulatory Visit (INDEPENDENT_AMBULATORY_CARE_PROVIDER_SITE_OTHER): Payer: Medicare Other | Admitting: Vascular Surgery

## 2022-07-03 VITALS — BP 100/63 | HR 90 | Resp 16 | Wt 263.4 lb

## 2022-07-03 DIAGNOSIS — I10 Essential (primary) hypertension: Secondary | ICD-10-CM

## 2022-07-03 DIAGNOSIS — E118 Type 2 diabetes mellitus with unspecified complications: Secondary | ICD-10-CM | POA: Diagnosis not present

## 2022-07-03 DIAGNOSIS — I7121 Aneurysm of the ascending aorta, without rupture: Secondary | ICD-10-CM | POA: Diagnosis not present

## 2022-07-03 DIAGNOSIS — I2699 Other pulmonary embolism without acute cor pulmonale: Secondary | ICD-10-CM | POA: Diagnosis not present

## 2022-07-03 DIAGNOSIS — M7989 Other specified soft tissue disorders: Secondary | ICD-10-CM

## 2022-07-03 NOTE — Assessment & Plan Note (Signed)
Swelling seems to be under pretty good control at this point.  Continue compression socks and elevation.  He will be following up in about 3 months for his thoracic aortic aneurysm, and we can reassess the swelling at that time.

## 2022-07-03 NOTE — Assessment & Plan Note (Addendum)
To be checked later this year.  CT scan ordered

## 2022-07-03 NOTE — Progress Notes (Signed)
MRN : [id number]  Jimmy Greer is a 81 y.o. (13-Apr-1941) male who presents with chief complaint of  Chief Complaint  Patient presents with   Follow-up    6 week follow up  .  History of Present Illness: Patient returns today in follow up of his leg swelling.  He has been out of [place] boots now for about 6 weeks.  He reports tolerating compression socks and is keeping his swelling under reasonably good control.  No further ulceration or weeping fluid.  He remains on the diuretic and is actually scheduled to see his primary care physician later today and will discuss whether or not he is to remain on this with him.  I think would be okay to come off of this particular if he starting to show any renal dysfunction.  Current Outpatient Medications  Medication Sig Dispense Refill   allopurinol (ZYLOPRIM) 300 MG tablet TAKE 1 TABLET ONE TIME DAILY     aspirin EC 81 MG tablet Take by mouth.     gabapentin (NEURONTIN) 600 MG tablet TAKE 1 TABLET THREE TIMES DAILY     glimepiride (AMARYL) 2 MG tablet Take 2 mg by mouth daily with breakfast.     glucose blood ([employer] BLOOD GLUCOSE TEST) test strip 1 each (1 strip total) once daily     hydrochlorothiazide (HYDRODIURIL) 25 MG tablet Take by mouth.     HYDROcodone-acetaminophen (NORCO/VICODIN) 5-325 MG tablet 1 po q6h prn     meloxicam (MOBIC) 7.5 MG tablet Take 7.5 mg by mouth daily.     metFORMIN (GLUCOPHAGE) 1000 MG tablet Take 1,000 mg by mouth 2 (two) times daily with a meal.     methocarbamol (ROBAXIN) 750 MG tablet Take 750 mg by mouth 2 (two) times daily.     metoprolol succinate (TOPROL-XL) 25 MG 24 hr tablet Take 25 mg by mouth daily.     Multiple Vitamin (MULTI-VITAMINS) TABS Take by mouth.     omeprazole (PRILOSEC) 40 MG capsule TAKE 1 CAPSULE TWICE DAILY     ONETOUCH VERIO test strip daily.     pioglitazone (ACTOS) 30 MG tablet Take 30 mg by mouth daily.     sildenafil (REVATIO) 20 MG tablet 1 tablet daily 90 tablet 3   simvastatin  (ZOCOR) 40 MG tablet TAKE 1 TABLET EVERY NIGHT     simvastatin (ZOCOR) 40 MG tablet Take 1 tablet by mouth at bedtime.     tamsulosin (FLOMAX) 0.4 MG CAPS capsule TAKE 1 CAPSULE TWICE DAILY     torsemide (DEMADEX) 20 MG tablet Take by mouth.     traMADol (ULTRAM) 50 MG tablet Take by mouth every 6 (six) hours as needed.     warfarin (COUMADIN) 3 MG tablet Take 3 mg by mouth. Five days per week     warfarin (COUMADIN) 5 MG tablet Take by mouth. 2 days per week     No current facility-administered medications for this visit.    Past Medical History:  Diagnosis Date   Arthritis    Bleeding disorder ([place])    Cancer ([place])    Diabetes mellitus without complication ([place])    GERD (gastroesophageal reflux disease)    Hypertension     Past Surgical History:  Procedure Laterality Date   APPENDECTOMY     CARPAL TUNNEL RELEASE     COLONOSCOPY WITH PROPOFOL N/A 09/25/2017   Procedure: COLONOSCOPY WITH PROPOFOL;  Surgeon: Toledo, Benay Pike, MD;  Location: [facility] ENDOSCOPY;  Service: Endoscopy;  Laterality:  N/A;   ESOPHAGOGASTRODUODENOSCOPY (EGD) WITH PROPOFOL N/A 09/25/2017   Procedure: ESOPHAGOGASTRODUODENOSCOPY (EGD) WITH PROPOFOL;  Surgeon: Toledo, Benay Pike, MD;  Location: [facility] ENDOSCOPY;  Service: Endoscopy;  Laterality: N/A;   GALLBLADDER SURGERY     REPLACEMENT TOTAL KNEE Bilateral      Social History   Tobacco Use   Smoking status: Former   Smokeless tobacco: Never   Tobacco comments:    quit 35 years  Vaping Use   Vaping Use: Never used  Substance Use Topics   Alcohol use: Yes   Drug use: No      Family History  Problem Relation Age of Onset   Prostate cancer Brother    Bladder Cancer Neg Hx    Kidney cancer Neg Hx      No Known Allergies  REVIEW OF SYSTEMS (Negative unless checked)   Constitutional: '[]'$ Weight loss  '[]'$ Fever  '[]'$ Chills Cardiac: '[]'$ Chest pain   '[]'$ Chest pressure   '[]'$ Palpitations   '[]'$ Shortness of breath when laying flat   '[x]'$ Shortness of breath at rest    '[x]'$ Shortness of breath with exertion. Vascular:  '[]'$ Pain in legs with walking   '[]'$ Pain in legs at rest   '[]'$ Pain in legs when laying flat   '[]'$ Claudication   '[]'$ Pain in feet when walking  '[]'$ Pain in feet at rest  '[]'$ Pain in feet when laying flat   '[x]'$ History of DVT   '[]'$ Phlebitis   '[x]'$ Swelling in legs   '[]'$ Varicose veins   '[]'$ Non-healing ulcers Pulmonary:   '[]'$ Uses home oxygen   '[]'$ Productive cough   '[]'$ Hemoptysis   '[]'$ Wheeze  '[]'$ COPD   '[]'$ Asthma Neurologic:  '[]'$ Dizziness  '[]'$ Blackouts   '[]'$ Seizures   '[]'$ History of stroke   '[]'$ History of TIA  '[]'$ Aphasia   '[]'$ Temporary blindness   '[]'$ Dysphagia   '[]'$ Weakness or numbness in arms   '[]'$ Weakness or numbness in legs Musculoskeletal:  '[x]'$ Arthritis   '[]'$ Joint swelling   '[]'$ Joint pain   '[]'$ Low back pain Hematologic:  '[]'$ Easy bruising  '[]'$ Easy bleeding   '[]'$ Hypercoagulable state   '[]'$ Anemic  '[]'$ Hepatitis Gastrointestinal:  '[]'$ Blood in stool   '[]'$ Vomiting blood  '[]'$ Gastroesophageal reflux/heartburn   '[]'$ Abdominal pain Genitourinary:  '[]'$ Chronic kidney disease   '[]'$ Difficult urination  '[]'$ Frequent urination  '[]'$ Burning with urination   '[]'$ Hematuria Skin:  '[]'$ Rashes   '[]'$ Ulcers   '[]'$ Wounds Psychological:  '[]'$ History of anxiety   '[]'$  History of major depression.   Physical Examination  BP 100/63 (BP Location: Right Arm)   Pulse 90   Resp 16   Wt 263 lb 6.4 oz (119.5 kg)   BMI 40.05 kg/m  Gen:  WD/WN, NAD Head: [place]/AT, No temporalis wasting. Ear/Nose/Throat: Hearing grossly intact, nares w/o erythema or drainage Eyes: Conjunctiva clear. Sclera non-icteric Neck: Supple.  Trachea midline Pulmonary:  Good air movement, no use of accessory muscles.  Cardiac: RRR, no JVD Vascular:  Vessel Right Left  Radial Palpable Palpable               Musculoskeletal: M/S 5/5 throughout.  No deformity or atrophy.  1+ bilateral lower extremity edema. Neurologic: Sensation grossly intact in extremities.  Symmetrical.  Speech is fluent.  Psychiatric: Judgment intact, Mood & affect appropriate for pt's clinical  situation. Dermatologic: No rashes or ulcers noted.  No cellulitis or open wounds.      Labs Recent Results (from the past 2160 hour(s))  PSA     Status: None   Collection Time: 06/29/22 11:20 AM  Result Value Ref Range   Prostate Specific Ag, Serum <0.1 0.0 - 4.0 ng/mL  Comment: Roche ECLIA methodology. According to the American Urological Association, Serum PSA should decrease and remain at undetectable levels after radical prostatectomy. The AUA defines biochemical recurrence as an initial PSA value 0.2 ng/mL or greater followed by a subsequent confirmatory PSA value 0.2 ng/mL or greater. Values obtained with different assay methods or kits cannot be used interchangeably. Results cannot be interpreted as absolute evidence of the presence or absence of malignant disease.     Radiology No results found.  Assessment/Plan HTN (hypertension) blood pressure control important in reducing the progression of atherosclerotic disease. On appropriate oral medications.     Type II diabetes mellitus with manifestations (HCC) blood glucose control important in reducing the progression of atherosclerotic disease. Also, involved in wound healing. On appropriate medications.     Pulmonary embolism (HCC) On coumadin.  He would be interested in moving to one of the newer agents once they are no longer cost prohibitive  Thoracic aortic aneurysm (TAA) ([place]) To be checked later this year.  CT scan ordered  Swelling of limb Swelling seems to be under pretty good control at this point.  Continue compression socks and elevation.  He will be following up in about 3 months for his thoracic aortic aneurysm, and we can reassess the swelling at that time.    Leotis Pain, MD  07/03/2022 2:20 PM    This note was created with Dragon medical transcription system.  Any errors from dictation are purely unintentional

## 2022-07-10 ENCOUNTER — Telehealth: Payer: Self-pay | Admitting: *Deleted

## 2022-07-10 NOTE — Telephone Encounter (Signed)
Notified patient as instructed, patient pleased °

## 2022-07-10 NOTE — Telephone Encounter (Signed)
-----   Message from Abbie Sons, MD sent at 07/10/2022  3:03 PM EDT ----- PSA remains undetectable at <0.1

## 2022-08-06 ENCOUNTER — Encounter (INDEPENDENT_AMBULATORY_CARE_PROVIDER_SITE_OTHER): Payer: Self-pay | Admitting: Vascular Surgery

## 2022-08-27 ENCOUNTER — Telehealth (INDEPENDENT_AMBULATORY_CARE_PROVIDER_SITE_OTHER): Payer: Self-pay | Admitting: Vascular Surgery

## 2022-08-27 NOTE — Telephone Encounter (Signed)
LVM for pt advising of no prior auth required for the CT ordered by Dr. Lucky Cowboy (due around 10.01.23). I gave # for radiology scheduling at [phone] and asked pt to call us back at the office to make a CT results appt with Dr. Lucky Cowboy for after the CT. Nothing further is needed at this time.

## 2022-10-03 ENCOUNTER — Ambulatory Visit
Admission: RE | Admit: 2022-10-03 | Discharge: 2022-10-03 | Disposition: A | Discharge status: Home / Self Care | Payer: Medicare Other | Source: Ambulatory Visit | Attending: Vascular Surgery | Admitting: Vascular Surgery

## 2022-10-03 ENCOUNTER — Ambulatory Visit: Admission: RE | Admit: 2022-10-03 | Payer: Medicare Other | Source: Ambulatory Visit

## 2022-10-03 DIAGNOSIS — I7121 Aneurysm of the ascending aorta, without rupture: Secondary | ICD-10-CM | POA: Diagnosis present

## 2022-10-03 LAB — POCT I-STAT CREATININE: Creatinine, Ser: 1.3 mg/dL — ABNORMAL HIGH (ref 0.61–1.24)

## 2022-10-03 MED ORDER — IOHEXOL 350 MG/ML SOLN
75.0000 mL | Freq: Once | INTRAVENOUS | Status: AC | PRN
  Administered 2022-10-03: 75 mL via INTRAVENOUS

## 2022-10-09 ENCOUNTER — Encounter (INDEPENDENT_AMBULATORY_CARE_PROVIDER_SITE_OTHER): Payer: Self-pay | Admitting: Vascular Surgery

## 2022-10-09 ENCOUNTER — Ambulatory Visit (INDEPENDENT_AMBULATORY_CARE_PROVIDER_SITE_OTHER): Payer: Medicare Other | Admitting: Vascular Surgery

## 2022-10-09 VITALS — BP 105/59 | HR 63 | Ht 69.0 in | Wt 261.0 lb

## 2022-10-09 DIAGNOSIS — E118 Type 2 diabetes mellitus with unspecified complications: Secondary | ICD-10-CM

## 2022-10-09 DIAGNOSIS — I10 Essential (primary) hypertension: Secondary | ICD-10-CM

## 2022-10-09 DIAGNOSIS — I2699 Other pulmonary embolism without acute cor pulmonale: Secondary | ICD-10-CM | POA: Diagnosis not present

## 2022-10-09 DIAGNOSIS — I7121 Aneurysm of the ascending aorta, without rupture: Secondary | ICD-10-CM

## 2022-10-09 DIAGNOSIS — M7989 Other specified soft tissue disorders: Secondary | ICD-10-CM

## 2022-10-09 NOTE — Progress Notes (Signed)
MRN : [id number]  Jimmy Greer is a 81 y.o. (02-09-1941) male who presents with chief complaint of  Chief Complaint  Patient presents with   Follow-up  .  History of Present Illness: Patient returns today in follow up of his thoracic aortic aneurysm.  He has undergone a CT scan of the chest which I have reviewed.  He has no aneurysm related symptoms.  Specifically, no chest or back pain or signs of peripheral embolization. I have reviewed his recent CT scan of the chest.  This demonstrates a stable 4.1 cm ascending thoracic aortic aneurysm which is unchanged from previous study He also has been seen this year for his leg swelling.  He was in [employer] earlier in the summer.  He has been out of those now for about 3 months and seems to be doing well with his compression socks with mild swelling.  Current Outpatient Medications  Medication Sig Dispense Refill   allopurinol (ZYLOPRIM) 300 MG tablet TAKE 1 TABLET ONE TIME DAILY     aspirin EC 81 MG tablet Take by mouth.     gabapentin (NEURONTIN) 600 MG tablet TAKE 1 TABLET THREE TIMES DAILY     glimepiride (AMARYL) 2 MG tablet Take 2 mg by mouth daily with breakfast.     glucose blood ([employer] BLOOD GLUCOSE TEST) test strip 1 each (1 strip total) once daily     hydrochlorothiazide (HYDRODIURIL) 25 MG tablet Take by mouth.     HYDROcodone-acetaminophen (NORCO/VICODIN) 5-325 MG tablet 1 po q6h prn     meloxicam (MOBIC) 7.5 MG tablet Take 7.5 mg by mouth daily.     metFORMIN (GLUCOPHAGE) 1000 MG tablet Take 1,000 mg by mouth 2 (two) times daily with a meal.     methocarbamol (ROBAXIN) 750 MG tablet Take 750 mg by mouth 2 (two) times daily.     metoprolol succinate (TOPROL-XL) 25 MG 24 hr tablet Take 25 mg by mouth daily.     Multiple Vitamin (MULTI-VITAMINS) TABS Take by mouth.     omeprazole (PRILOSEC) 40 MG capsule TAKE 1 CAPSULE TWICE DAILY     ONETOUCH VERIO test strip daily.     pioglitazone (ACTOS) 30 MG tablet Take 30 mg by mouth  daily.     sildenafil (REVATIO) 20 MG tablet 1 tablet daily 90 tablet 3   simvastatin (ZOCOR) 40 MG tablet Take 1 tablet by mouth at bedtime.     tamsulosin (FLOMAX) 0.4 MG CAPS capsule TAKE 1 CAPSULE TWICE DAILY     torsemide (DEMADEX) 20 MG tablet Take by mouth.     traMADol (ULTRAM) 50 MG tablet Take by mouth every 6 (six) hours as needed.     warfarin (COUMADIN) 3 MG tablet Take 3 mg by mouth. Five days per week     warfarin (COUMADIN) 5 MG tablet Take by mouth. 2 days per week     simvastatin (ZOCOR) 40 MG tablet TAKE 1 TABLET EVERY NIGHT     No current facility-administered medications for this visit.    Past Medical History:  Diagnosis Date   Arthritis    Bleeding disorder ([place])    Cancer ([place])    Diabetes mellitus without complication ([place])    GERD (gastroesophageal reflux disease)    Hypertension     Past Surgical History:  Procedure Laterality Date   APPENDECTOMY     CARPAL TUNNEL RELEASE     COLONOSCOPY WITH PROPOFOL N/A 09/25/2017   Procedure: COLONOSCOPY WITH PROPOFOL;  Surgeon:  Toledo, Benay Pike, MD;  Location: [facility] END[facility]COPY;  Service: Endoscopy;  Laterality: N/A;   ESOPHAGOGASTRODUODENOSCOPY (EGD) WITH PROPOFOL N/A 09/25/2017   Procedure: ESOPHAGOGASTRODUODENOSCOPY (EGD) WITH PROPOFOL;  Surgeon: Toledo, Benay Pike, MD;  Location: [facility];  Service: Endoscopy;  Laterality: N/A;   GALLBLADDER SURGERY     REPLACEMENT TOTAL KNEE Bilateral      Social History   Tobacco Use   Smoking status: Former   Smokeless tobacco: Never   Tobacco comments:    quit 35 years  Vaping Use   Vaping Use: Never used  Substance Use Topics   Alcohol use: Yes   Drug use: No      Family History  Problem Relation Age of Onset   Prostate cancer Brother    Bladder Cancer Neg Hx    Kidney cancer Neg Hx     No Known Allergies  REVIEW OF SYSTEMS (Negative unless checked)   Constitutional: '[]'$ Weight loss  '[]'$ Fever  '[]'$ Chills Cardiac: '[]'$ Chest pain   '[]'$ Chest pressure    '[]'$ Palpitations   '[]'$ Shortness of breath when laying flat   '[x]'$ Shortness of breath at rest   '[x]'$ Shortness of breath with exertion. Vascular:  '[]'$ Pain in legs with walking   '[]'$ Pain in legs at rest   '[]'$ Pain in legs when laying flat   '[]'$ Claudication   '[]'$ Pain in feet when walking  '[]'$ Pain in feet at rest  '[]'$ Pain in feet when laying flat   '[x]'$ History of DVT   '[]'$ Phlebitis   '[x]'$ Swelling in legs   '[]'$ Varicose veins   '[]'$ Non-healing ulcers Pulmonary:   '[]'$ Uses home oxygen   '[]'$ Productive cough   '[]'$ Hemoptysis   '[]'$ Wheeze  '[]'$ COPD   '[]'$ Asthma Neurologic:  '[]'$ Dizziness  '[]'$ Blackouts   '[]'$ Seizures   '[]'$ History of stroke   '[]'$ History of TIA  '[]'$ Aphasia   '[]'$ Temporary blindness   '[]'$ Dysphagia   '[]'$ Weakness or numbness in arms   '[]'$ Weakness or numbness in legs Musculoskeletal:  '[x]'$ Arthritis   '[]'$ Joint swelling   '[]'$ Joint pain   '[]'$ Low back pain Hematologic:  '[]'$ Easy bruising  '[]'$ Easy bleeding   '[]'$ Hypercoagulable state   '[]'$ Anemic  '[]'$ Hepatitis Gastrointestinal:  '[]'$ Blood in stool   '[]'$ Vomiting blood  '[]'$ Gastroesophageal reflux/heartburn   '[]'$ Abdominal pain Genitourinary:  '[]'$ Chronic kidney disease   '[]'$ Difficult urination  '[]'$ Frequent urination  '[]'$ Burning with urination   '[]'$ Hematuria Skin:  '[]'$ Rashes   '[]'$ Ulcers   '[]'$ Wounds Psychological:  '[]'$ History of anxiety   '[]'$  History of major depression  Physical Examination  BP (!) 105/59   Pulse 63   Ht '5\' 9"'$  (1.753 m)   Wt 261 lb (118.4 kg)   BMI 38.54 kg/m  Gen:  WD/WN, NAD. Appears younger than stated age. Head: [place]/AT, No temporalis wasting. Ear/Nose/Throat: Hearing grossly intact, nares w/o erythema or drainage Eyes: Conjunctiva clear. Sclera non-icteric Neck: Supple.  Trachea midline Pulmonary:  Good air movement, no use of accessory muscles.  Cardiac: RRR, no JVD Vascular:  Vessel Right Left  Radial Palpable Palpable           Musculoskeletal: M/S 5/5 throughout.  No deformity or atrophy. Trace BLE edema. Neurologic: Sensation grossly intact in extremities.  Symmetrical.  Speech is fluent.   Psychiatric: Judgment intact, Mood & affect appropriate for pt's clinical situation. Dermatologic: No rashes or ulcers noted.  No cellulitis or open wounds.      Labs Recent Results (from the past 2160 hour(s))  I-STAT creatinine     Status: Abnormal   Collection Time: 10/03/22  1:23 PM  Result Value Ref Range   Creatinine,  Ser 1.30 (H) 0.61 - 1.24 mg/dL    Radiology CT ANGIO CHEST AORTA W/CM & OR WO/CM  Result Date: 10/03/2022 CLINICAL DATA:  Thoracic aortic aneurysm. EXAM: CT ANGIOGRAPHY CHEST WITH CONTRAST TECHNIQUE: Multidetector CT imaging of the chest was performed using the standard protocol during bolus administration of intravenous contrast. Multiplanar CT image reconstructions and MIPs were obtained to evaluate the vascular anatomy. RADIATION DOSE REDUCTION: This exam was performed according to the departmental dose-optimization program which includes automated exposure control, adjustment of the mA and/or kV according to patient size and/or use of iterative reconstruction technique. CONTRAST:  88m OMNIPAQUE IOHEXOL 350 MG/ML SOLN COMPARISON:  October 05, 2021. FINDINGS: Cardiovascular: Grossly stable 4.1 cm ascending thoracic aortic aneurysm is noted. No dissection is noted. Great vessels are widely patent. Normal cardiac size. No pericardial effusion. Mild to moderate coronary artery calcifications are noted. Mediastinum/Nodes: No enlarged mediastinal, hilar, or axillary lymph nodes. Thyroid gland, trachea, and esophagus demonstrate no significant findings. Lungs/Pleura: Lungs are clear. No pleural effusion or pneumothorax. Upper Abdomen: No acute abnormality. Musculoskeletal: No chest wall abnormality. No acute or significant osseous findings. Review of the MIP images confirms the above findings. IMPRESSION: Grossly stable 4.1 cm ascending thoracic aortic aneurysm. Recommend annual imaging followup by CTA or MRA. This recommendation follows 2010  ACCF/AHA/AATS/ACR/ASA/SCA/SCAI/SIR/STS/SVM Guidelines for the Diagnosis and Management of Patients with Thoracic Aortic Disease. Circulation. 2010; 121:: Y850-Y774 Aortic aneurysm NOS (ICD10-I71.9). Mild to moderate coronary artery calcifications are noted. Aortic Atherosclerosis (ICD10-I70.0). Electronically Signed   By: JMarijo ConceptionM.D.   On: 10/03/2022 13:53    Assessment/Plan HTN (hypertension) blood pressure control important in reducing the progression of atherosclerotic disease. On appropriate oral medications.     Type II diabetes mellitus with manifestations (HCC) blood glucose control important in reducing the progression of atherosclerotic disease. Also, involved in wound healing. On appropriate medications.     Pulmonary embolism (HCC) On coumadin.  He would be interested in moving to one of the newer agents once they are no longer cost prohibitive  [place]oracic [place]) [place] I have reviewed his recent CT scan of the chest.  This demonstrates a stable 4.1 cm ascending thoracic aortic aneurysm which is unchanged from previous study.  No role for intervention.  Blood pressure control is of paramount importance of keeping this from growing.  Recheck in 1 year.  Swelling of limb Compression socks and elevation seem to be doing an excellent job of controlling his swelling.    JLeotis Pain MD  10/09/2022 2:55 PM    This note was created with Dragon medical transcription system.  Any errors from dictation are purely unintentional

## 2022-10-09 NOTE — Assessment & Plan Note (Signed)
Compression socks and elevation seem to be doing an excellent job of controlling his swelling.

## 2022-10-09 NOTE — Assessment & Plan Note (Signed)
I have reviewed his recent CT scan of the chest.  This demonstrates a stable 4.1 cm ascending thoracic aortic aneurysm which is unchanged from previous study.  No role for intervention.  Blood pressure control is of paramount importance of keeping this from growing.  Recheck in 1 year.

## 2022-10-15 ENCOUNTER — Encounter (INDEPENDENT_AMBULATORY_CARE_PROVIDER_SITE_OTHER): Payer: Self-pay

## 2022-11-02 ENCOUNTER — Encounter: Payer: Self-pay | Admitting: Gastroenterology

## 2022-11-05 ENCOUNTER — Encounter
Admission: RE | Disposition: A | Discharge status: Home / Self Care | Payer: Self-pay | Source: Home / Self Care | Attending: Gastroenterology

## 2022-11-05 ENCOUNTER — Ambulatory Visit: Payer: Medicare Other | Admitting: Anesthesiology

## 2022-11-05 ENCOUNTER — Ambulatory Visit
Admission: RE | Admit: 2022-11-05 | Discharge: 2022-11-05 | Disposition: A | Discharge status: Home / Self Care | Payer: Medicare Other | Attending: Gastroenterology | Admitting: Gastroenterology

## 2022-11-05 ENCOUNTER — Encounter: Payer: Self-pay | Admitting: Gastroenterology

## 2022-11-05 DIAGNOSIS — K5730 Diverticulosis of large intestine without perforation or abscess without bleeding: Secondary | ICD-10-CM | POA: Diagnosis not present

## 2022-11-05 DIAGNOSIS — Z7901 Long term (current) use of anticoagulants: Secondary | ICD-10-CM | POA: Insufficient documentation

## 2022-11-05 DIAGNOSIS — I2510 Atherosclerotic heart disease of native coronary artery without angina pectoris: Secondary | ICD-10-CM | POA: Insufficient documentation

## 2022-11-05 DIAGNOSIS — K449 Diaphragmatic hernia without obstruction or gangrene: Secondary | ICD-10-CM | POA: Insufficient documentation

## 2022-11-05 DIAGNOSIS — Z79899 Other long term (current) drug therapy: Secondary | ICD-10-CM | POA: Insufficient documentation

## 2022-11-05 DIAGNOSIS — Z6838 Body mass index (BMI) 38.0-38.9, adult: Secondary | ICD-10-CM | POA: Insufficient documentation

## 2022-11-05 DIAGNOSIS — D123 Benign neoplasm of transverse colon: Secondary | ICD-10-CM | POA: Insufficient documentation

## 2022-11-05 DIAGNOSIS — G4730 Sleep apnea, unspecified: Secondary | ICD-10-CM | POA: Diagnosis not present

## 2022-11-05 DIAGNOSIS — K2289 Other specified disease of esophagus: Secondary | ICD-10-CM | POA: Insufficient documentation

## 2022-11-05 DIAGNOSIS — K635 Polyp of colon: Secondary | ICD-10-CM | POA: Insufficient documentation

## 2022-11-05 DIAGNOSIS — K219 Gastro-esophageal reflux disease without esophagitis: Secondary | ICD-10-CM | POA: Insufficient documentation

## 2022-11-05 DIAGNOSIS — D122 Benign neoplasm of ascending colon: Secondary | ICD-10-CM | POA: Diagnosis not present

## 2022-11-05 DIAGNOSIS — D689 Coagulation defect, unspecified: Secondary | ICD-10-CM | POA: Diagnosis not present

## 2022-11-05 DIAGNOSIS — E1151 Type 2 diabetes mellitus with diabetic peripheral angiopathy without gangrene: Secondary | ICD-10-CM | POA: Insufficient documentation

## 2022-11-05 DIAGNOSIS — Z87891 Personal history of nicotine dependence: Secondary | ICD-10-CM | POA: Diagnosis not present

## 2022-11-05 DIAGNOSIS — D120 Benign neoplasm of cecum: Secondary | ICD-10-CM | POA: Diagnosis not present

## 2022-11-05 DIAGNOSIS — Z86711 Personal history of pulmonary embolism: Secondary | ICD-10-CM | POA: Diagnosis not present

## 2022-11-05 DIAGNOSIS — Z7984 Long term (current) use of oral hypoglycemic drugs: Secondary | ICD-10-CM | POA: Diagnosis not present

## 2022-11-05 DIAGNOSIS — Z9049 Acquired absence of other specified parts of digestive tract: Secondary | ICD-10-CM | POA: Insufficient documentation

## 2022-11-05 DIAGNOSIS — K640 First degree hemorrhoids: Secondary | ICD-10-CM | POA: Diagnosis not present

## 2022-11-05 DIAGNOSIS — I10 Essential (primary) hypertension: Secondary | ICD-10-CM | POA: Insufficient documentation

## 2022-11-05 DIAGNOSIS — Z1211 Encounter for screening for malignant neoplasm of colon: Secondary | ICD-10-CM | POA: Diagnosis not present

## 2022-11-05 DIAGNOSIS — Z96653 Presence of artificial knee joint, bilateral: Secondary | ICD-10-CM | POA: Insufficient documentation

## 2022-11-05 DIAGNOSIS — K2970 Gastritis, unspecified, without bleeding: Secondary | ICD-10-CM | POA: Diagnosis not present

## 2022-11-05 HISTORY — PX: COLONOSCOPY WITH PROPOFOL: SHX5780

## 2022-11-05 HISTORY — PX: ESOPHAGOGASTRODUODENOSCOPY (EGD) WITH PROPOFOL: SHX5813

## 2022-11-05 LAB — GLUCOSE, CAPILLARY: Glucose-Capillary: 158 mg/dL — ABNORMAL HIGH (ref 70–99)

## 2022-11-05 SURGERY — COLONOSCOPY WITH PROPOFOL
Anesthesia: General

## 2022-11-05 MED ORDER — STERILE WATER FOR IRRIGATION IR SOLN
Status: DC | PRN
  Administered 2022-11-05: 50 mL

## 2022-11-05 MED ORDER — SODIUM CHLORIDE 0.9 % IV SOLN: INTRAVENOUS | Status: DC

## 2022-11-05 MED ORDER — LIDOCAINE HCL (CARDIAC) PF 100 MG/5ML IV SOSY
PREFILLED_SYRINGE | INTRAVENOUS | Status: DC | PRN
  Administered 2022-11-05: 50 mg via INTRAVENOUS

## 2022-11-05 MED ORDER — PROPOFOL 10 MG/ML IV BOLUS
INTRAVENOUS | Status: AC
  Filled 2022-11-05: qty 20

## 2022-11-05 MED ORDER — PROPOFOL 500 MG/50ML IV EMUL
INTRAVENOUS | Status: DC | PRN
  Administered 2022-11-05: 175 ug/kg/min via INTRAVENOUS

## 2022-11-05 MED ORDER — PROPOFOL 10 MG/ML IV BOLUS
INTRAVENOUS | Status: DC | PRN
  Administered 2022-11-05: 80 mg via INTRAVENOUS

## 2022-11-05 NOTE — Anesthesia Procedure Notes (Signed)
Date/Time: 11/05/2022 1:54 PM  Performed by: Johnna Acosta, CRNAPre-anesthesia Checklist: Patient identified, Emergency Drugs available, Suction available, Patient being monitored and Timeout performed Patient Re-evaluated:Patient Re-evaluated prior to induction Oxygen Delivery Method: Nasal cannula Preoxygenation: Pre-oxygenation with 100% oxygen Induction Type: IV induction

## 2022-11-05 NOTE — Op Note (Signed)
[facility] Gastroenterology Patient Name: Jimmy Greer Procedure Date: 11/05/2022 1:38 PM MRN: [id number] Account #: [id number] Date of Birth: 1941/08/10 Admit Type: Outpatient Age: 81 Room: [facility] ENDO ROOM 2 Gender: Male Note Status: Finalized Instrument Name: Colonoscope [id number] Procedure:             Colonoscopy Indications:           High risk colon cancer surveillance: Personal history                         of colonic polyps Providers:             Rueben Bash, DO Referring MD:          Leonie Douglas. Doy Hutching, MD (Referring MD) Medicines:             Monitored Anesthesia Care Complications:         No immediate complications. Estimated blood loss:                         Minimal. Procedure:             Pre-Anesthesia Assessment:                        - Prior to the procedure, a History and Physical was                         performed, and patient medications and allergies were                         reviewed. The patient is competent. The risks and                         benefits of the procedure and the sedation options and                         risks were discussed with the patient. All questions                         were answered and informed consent was obtained.                         Patient identification and proposed procedure were                         verified by the physician, the nurse, the anesthetist                         and the technician in the endoscopy suite. Mental                         Status Examination: alert and oriented. Airway                         Examination: normal oropharyngeal airway and neck                         mobility. Respiratory Examination: clear to  auscultation. CV Examination: RRR, no murmurs, no S3                         or S4. Prophylactic Antibiotics: The patient does not                         require prophylactic antibiotics. Prior                          Anticoagulants: The patient has taken Coumadin                         (warfarin), last dose was 5 days prior to procedure.                         ASA Grade Assessment: III - A patient with severe                         systemic disease. After reviewing the risks and                         benefits, the patient was deemed in satisfactory                         condition to undergo the procedure. The anesthesia                         plan was to use monitored anesthesia care (MAC).                         Immediately prior to administration of medications,                         the patient was re-assessed for adequacy to receive                         sedatives. The heart rate, respiratory rate, oxygen                         saturations, blood pressure, adequacy of pulmonary                         ventilation, and response to care were monitored                         throughout the procedure. The physical status of the                         patient was re-assessed after the procedure.                        After obtaining informed consent, the colonoscope was                         passed under direct vision. Throughout the procedure,                         the patient's blood pressure, pulse, and oxygen  saturations were monitored continuously. The                         Colonoscope was introduced through the anus and                         advanced to the the terminal ileum, with                         identification of the appendiceal orifice and IC                         valve. The colonoscopy was performed without                         difficulty. The patient tolerated the procedure well.                         The quality of the bowel preparation was evaluated                         using the BBPS [facility] Bowel Preparation Scale) with                         scores of: Right Colon = 2 (minor amount of residual                          staining, small fragments of stool and/or opaque                         liquid, but mucosa seen well), Transverse Colon = 3                         (entire mucosa seen well with no residual staining,                         small fragments of stool or opaque liquid) and Left                         Colon = 3 (entire mucosa seen well with no residual                         staining, small fragments of stool or opaque liquid).                         The total BBPS score equals 8. The quality of the                         bowel preparation was excellent. The terminal ileum,                         ileocecal valve, appendiceal orifice, and rectum were                         photographed. Findings:      Hemorrhoids were found on perianal exam.      The terminal ileum appeared normal. Estimated blood loss: none.  Multiple small-mouthed diverticula were found in the left colon.       Estimated blood loss: none.      Non-bleeding internal hemorrhoids were found during retroflexion. The       hemorrhoids were Grade I (internal hemorrhoids that do not prolapse).       Estimated blood loss: none.      Two sessile polyps were found in the ascending colon. The polyps were 3       to 5 mm in size. These polyps were removed with a cold snare. Resection       and retrieval were complete. Estimated blood loss was minimal. To       prevent bleeding after the polypectomy, one hemostatic clip was       successfully placed (MR conditional). There was no bleeding at the end       of the procedure. Estimated blood loss: none.      Five sessile polyps were found in the descending colon, transverse colon       (2), ascending colon and cecum. The polyps were 1 to 2 mm in size. These       polyps were removed with a jumbo cold forceps. Resection and retrieval       were complete. Estimated blood loss was minimal.      Retroflexion in the right colon was performed.      The exam was otherwise without  abnormality on direct and retroflexion       views. Impression:            - Hemorrhoids found on perianal exam.                        - The examined portion of the ileum was normal.                        - Diverticulosis in the left colon.                        - Non-bleeding internal hemorrhoids.                        - Two 3 to 5 mm polyps in the ascending colon, removed                         with a cold snare. Resected and retrieved. Clip (MR                         conditional) was placed.                        - Five 1 to 2 mm polyps in the descending colon, in                         the transverse colon, in the ascending colon and in                         the cecum, removed with a jumbo cold forceps. Resected                         and retrieved.                        -  The examination was otherwise normal on direct and                         retroflexion views. Recommendation:        - Patient has a contact number available for                         emergencies. The signs and symptoms of potential                         delayed complications were discussed with the patient.                         Return to normal activities tomorrow. Written                         discharge instructions were provided to the patient.                        - Discharge patient to home.                        - Resume previous diet.                        - Continue present medications.                        - No aspirin, ibuprofen, naproxen, or other                         non-steroidal anti-inflammatory drugs for 5 days after                         polyp removal.                        - Resume Coumadin (warfarin) at prior dose tomorrow.                         Refer to managing physician for further adjustment of                         therapy.                        - Await pathology results.                        - Repeat colonoscopy for surveillance based on                          pathology results.                        - Return to GI office as previously scheduled.                        - The findings and recommendations were discussed with  the patient. Procedure Code(s):     --- Professional ---                        [phone], Colonoscopy, flexible; with removal of                         tumor(s), polyp(s), or other lesion(s) by snare                         technique                        45380, 56, Colonoscopy, flexible; with biopsy, single                         or multiple Diagnosis Code(s):     --- Professional ---                        Z86.010, Personal history of colonic polyps                        K64.0, First degree hemorrhoids                        D12.4, Benign neoplasm of descending colon                        D12.3, Benign neoplasm of transverse colon (hepatic                         flexure or splenic flexure)                        D12.2, Benign neoplasm of ascending colon                        D12.0, Benign neoplasm of cecum                        K57.30, Diverticulosis of large intestine without                         perforation or abscess without bleeding CPT copyright 2022 American Medical Association. All rights reserved. The codes documented in this report are preliminary and upon coder review may  be revised to meet current compliance requirements. Attending Participation:      I personally performed the entire procedure. Volney American, DO Annamaria Helling DO, DO 11/05/2022 2:47:18 PM This report has been signed electronically. Number of Addenda: 0 Note Initiated On: 11/05/2022 1:38 PM Scope Withdrawal Time: 0 hours 24 minutes 55 seconds  Total Procedure Duration: 0 hours 28 minutes 37 seconds  Estimated Blood Loss:  Estimated blood loss was minimal.      [facility]

## 2022-11-05 NOTE — Op Note (Signed)
[facility] Gastroenterology Patient Name: Jimmy Greer Procedure Date: 11/05/2022 1:38 PM MRN: [id number] Account #: [id number] Date of Birth: 1941/08/12 Admit Type: Outpatient Age: 81 Room: [facility] ENDO ROOM 2 Gender: Male Note Status: Finalized Instrument Name: Upper Endoscope [id number] Procedure:             Upper GI endoscopy Indications:           Heartburn Providers:             Annamaria Helling DO, DO Medicines:             Monitored Anesthesia Care Complications:         No immediate complications. Estimated blood loss:                         Minimal. Procedure:             Pre-Anesthesia Assessment:                        - Prior to the procedure, a History and Physical was                         performed, and patient medications and allergies were                         reviewed. The patient is competent. The risks and                         benefits of the procedure and the sedation options and                         risks were discussed with the patient. All questions                         were answered and informed consent was obtained.                         Patient identification and proposed procedure were                         verified by the physician, the nurse, the anesthetist                         and the technician in the endoscopy suite. Mental                         Status Examination: alert and oriented. Airway                         Examination: normal oropharyngeal airway and neck                         mobility. Respiratory Examination: clear to                         auscultation. CV Examination: RRR, no murmurs, no S3                         or S4. Prophylactic Antibiotics: The patient does not  require prophylactic antibiotics. Prior                         Anticoagulants: The patient has taken Coumadin                         (warfarin), last dose was 5 days prior to procedure.                          ASA Grade Assessment: III - A patient with severe                         systemic disease. After reviewing the risks and                         benefits, the patient was deemed in satisfactory                         condition to undergo the procedure. The anesthesia                         plan was to use monitored anesthesia care (MAC).                         Immediately prior to administration of medications,                         the patient was re-assessed for adequacy to receive                         sedatives. The heart rate, respiratory rate, oxygen                         saturations, blood pressure, adequacy of pulmonary                         ventilation, and response to care were monitored                         throughout the procedure. The physical status of the                         patient was re-assessed after the procedure.                        After obtaining informed consent, the endoscope was                         passed under direct vision. Throughout the procedure,                         the patient's blood pressure, pulse, and oxygen                         saturations were monitored continuously. The Endoscope                         was introduced through the mouth, and advanced to the  second part of duodenum. The upper GI endoscopy was                         accomplished without difficulty. The patient tolerated                         the procedure well. Findings:      The duodenal bulb, first portion of the duodenum and second portion of       the duodenum were normal. Estimated blood loss: none.      Localized mild inflammation characterized by erythema was found in the       gastric antrum. Biopsies were taken with a cold forceps for Helicobacter       pylori testing. Estimated blood loss was minimal.      A small hiatal hernia was present. Estimated blood loss: none.      The exam of the stomach was  otherwise normal.      Esophagogastric landmarks were identified: the gastroesophageal junction       was found at 39 cm from the incisors.      The Z-line was irregular and was found small island of salmon color       mucosa 1 cm above z line. Biopsies were taken with a cold forceps for       histology. Estimated blood loss was minimal.      The exam of the esophagus was otherwise normal. Impression:            - Normal duodenal bulb, first portion of the duodenum                         and second portion of the duodenum.                        - Gastritis. Biopsied.                        - Small hiatal hernia.                        - Esophagogastric landmarks identified.                        - Z-line irregular, small island of salmon color                         mucosa 1 cm above z line. Biopsied. Recommendation:        - Patient has a contact number available for                         emergencies. The signs and symptoms of potential                         delayed complications were discussed with the patient.                         Return to normal activities tomorrow. Written                         discharge instructions were provided to the patient.                        -  Discharge patient to home.                        - Resume previous diet.                        - Continue present medications.                        - Await pathology results.                        - Repeat upper endoscopy for surveillance based on                         pathology results.                        - Return to GI clinic as previously scheduled.                        - The findings and recommendations were discussed with                         the patient.                        - proceed with colonoscopy Procedure Code(s):     --- Professional ---                        [phone], Esophagogastroduodenoscopy, flexible,                         transoral; with biopsy, single or  multiple Diagnosis Code(s):     --- Professional ---                        K29.70, Gastritis, unspecified, without bleeding                        K44.9, Diaphragmatic hernia without obstruction or                         gangrene                        K22.89, Other specified disease of esophagus                        R12, Heartburn CPT copyright 2022 American Medical Association. All rights reserved. The codes documented in this report are preliminary and upon coder review may  be revised to meet current compliance requirements. Attending Participation:      I personally performed the entire procedure. Volney American, DO Annamaria Helling DO, DO 11/05/2022 2:09:08 PM This report has been signed electronically. Number of Addenda: 0 Note Initiated On: 11/05/2022 1:38 PM Estimated Blood Loss:  Estimated blood loss was minimal.      [facility]

## 2022-11-05 NOTE — H&P (Signed)
Pre-Procedure H&P   Patient ID: Jimmy Greer is a 81 y.o. male.  Gastroenterology Provider: Annamaria Helling, DO  Referring Provider: Laurine Blazer, PA PCP: Idelle Crouch, MD  Date: 11/05/2022  HPI Jimmy Greer is a 82 y.o. male who presents today for Esophagogastroduodenoscopy and Colonoscopy for acid reflux, surveillance- phx colon polyps .  Patient is attempting to come off of PPI but has not been able to do so.  Bowel movements have been regular without melena or hematochezia.  Patient is on Coumadin which has been held prior to this exam.  This is for previous PE  Creatinine 1.3 hemoglobin 13 MCV 87 platelets 106,000  Last underwent EGD and colonoscopy in October 2018.  Biopsies negative for Barrett's esophagus.  Colonoscopy notable for diverticulosis and internal hemorrhoids.  No family history of colon cancer or colon polyps   Past Medical History:  Diagnosis Date   Arthritis    Bleeding disorder ([place])    Cancer ([place])    Diabetes mellitus without complication ([place])    GERD (gastroesophageal reflux disease)    Hypertension     Past Surgical History:  Procedure Laterality Date   APPENDECTOMY     CARPAL TUNNEL RELEASE     COLONOSCOPY WITH PROPOFOL N/A 09/25/2017   Procedure: COLONOSCOPY WITH PROPOFOL;  Surgeon: Toledo, Benay Pike, MD;  Location: [facility] ENDOSCOPY;  Service: Endoscopy;  Laterality: N/A;   ESOPHAGOGASTRODUODENOSCOPY (EGD) WITH PROPOFOL N/A 09/25/2017   Procedure: ESOPHAGOGASTRODUODENOSCOPY (EGD) WITH PROPOFOL;  Surgeon: Toledo, Benay Pike, MD;  Location: [facility] ENDOSCOPY;  Service: Endoscopy;  Laterality: N/A;   GALLBLADDER SURGERY     REPLACEMENT TOTAL KNEE Bilateral     Family History No h/o GI disease or malignancy  Review of Systems  Constitutional:  Negative for activity change, appetite change, chills, diaphoresis, fatigue, fever and unexpected weight change.  HENT:  Negative for trouble swallowing and voice change.    Respiratory:  Negative for shortness of breath and wheezing.   Cardiovascular:  Negative for chest pain, palpitations and leg swelling.  Gastrointestinal:  Negative for abdominal distention, abdominal pain, anal bleeding, blood in stool, constipation, diarrhea, nausea and vomiting.       +reflux  Musculoskeletal:  Negative for arthralgias and myalgias.  Skin:  Negative for color change and pallor.  Neurological:  Negative for dizziness, syncope and weakness.  Psychiatric/Behavioral:  Negative for confusion. The patient is not nervous/anxious.   All other systems reviewed and are negative.    Medications No current facility-administered medications on file prior to encounter.   Current Outpatient Medications on File Prior to Encounter  Medication Sig Dispense Refill   allopurinol (ZYLOPRIM) 300 MG tablet TAKE 1 TABLET ONE TIME DAILY     aspirin EC 81 MG tablet Take by mouth.     gabapentin (NEURONTIN) 600 MG tablet TAKE 1 TABLET THREE TIMES DAILY     glimepiride (AMARYL) 2 MG tablet Take 2 mg by mouth daily with breakfast.     hydrochlorothiazide (HYDRODIURIL) 25 MG tablet Take by mouth.     meloxicam (MOBIC) 7.5 MG tablet Take 7.5 mg by mouth daily.     metFORMIN (GLUCOPHAGE) 1000 MG tablet Take 1,000 mg by mouth 2 (two) times daily with a meal.     methocarbamol (ROBAXIN) 750 MG tablet Take 750 mg by mouth 2 (two) times daily.     metoprolol succinate (TOPROL-XL) 25 MG 24 hr tablet Take 25 mg by mouth daily.     Multiple Vitamin (  MULTI-VITAMINS) TABS Take by mouth.     pioglitazone (ACTOS) 30 MG tablet Take 30 mg by mouth daily.     simvastatin (ZOCOR) 40 MG tablet Take 1 tablet by mouth at bedtime.     tamsulosin (FLOMAX) 0.4 MG CAPS capsule TAKE 1 CAPSULE TWICE DAILY     torsemide (DEMADEX) 20 MG tablet Take by mouth.     glucose blood ([employer] BLOOD GLUCOSE TEST) test strip 1 each (1 strip total) once daily     HYDROcodone-acetaminophen (NORCO/VICODIN) 5-325 MG tablet 1 po q6h prn      omeprazole (PRILOSEC) 40 MG capsule TAKE 1 CAPSULE TWICE DAILY     ONETOUCH VERIO test strip daily.     sildenafil (REVATIO) 20 MG tablet 1 tablet daily 90 tablet 3   simvastatin (ZOCOR) 40 MG tablet TAKE 1 TABLET EVERY NIGHT     traMADol (ULTRAM) 50 MG tablet Take by mouth every 6 (six) hours as needed.     warfarin (COUMADIN) 3 MG tablet Take 3 mg by mouth. Five days per week     warfarin (COUMADIN) 5 MG tablet Take by mouth. 2 days per week      Pertinent medications related to GI and procedure were reviewed by me with the patient prior to the procedure   Current Facility-Administered Medications:    0.9 %  sodium chloride infusion, , Intravenous, Continuous, Annamaria Helling, DO  sodium chloride         No Known Allergies Allergies were reviewed by me prior to the procedure  Objective   Body mass index is 38.4 kg/m. Vitals:   11/05/22 1324  BP: (!) 150/93  Pulse: 94  Resp: 17  Temp: 97.6 F (36.4 C)  TempSrc: Temporal  SpO2: 99%  Weight: 117.9 kg  Height: '5\' 9"'$  (1.753 m)     Physical Exam Vitals and nursing note reviewed.  Constitutional:      General: He is not in acute distress.    Appearance: Normal appearance. He is obese. He is not ill-appearing, toxic-appearing or diaphoretic.  HENT:     Head: Normocephalic and atraumatic.     Nose: Nose normal.     Mouth/Throat:     Mouth: Mucous membranes are moist.     Pharynx: Oropharynx is clear.  Eyes:     General: No scleral icterus.    Extraocular Movements: Extraocular movements intact.  Cardiovascular:     Rate and Rhythm: Normal rate and regular rhythm.     Heart sounds: Normal heart sounds. No murmur heard.    No friction rub. No gallop.  Pulmonary:     Effort: Pulmonary effort is normal. No respiratory distress.     Breath sounds: Normal breath sounds. No wheezing, rhonchi or rales.  Abdominal:     General: Bowel sounds are normal. There is no distension.     Palpations: Abdomen is soft.      Tenderness: There is no abdominal tenderness. There is no guarding or rebound.  Musculoskeletal:     Cervical back: Neck supple.     Right lower leg: No edema.     Left lower leg: No edema.  Skin:    General: Skin is warm and dry.     Coloration: Skin is not jaundiced or pale.  Neurological:     General: No focal deficit present.     Mental Status: He is alert and oriented to person, place, and time. Mental status is at baseline.  Psychiatric:  Mood and Affect: Mood normal.        Behavior: Behavior normal.        Thought Content: Thought content normal.        Judgment: Judgment normal.      Assessment:  Jimmy Greer is a 81 y.o. male  who presents today for Esophagogastroduodenoscopy and Colonoscopy for acid reflux, surveillance- phx colon polyps .  Plan:  Esophagogastroduodenoscopy and Colonoscopy with possible intervention today  Esophagogastroduodenoscopy and Colonoscopy with possible biopsy, control of bleeding, polypectomy, and interventions as necessary has been discussed with the patient/patient representative. Informed consent was obtained from the patient/patient representative after explaining the indication, nature, and risks of the procedure including but not limited to death, bleeding, perforation, missed neoplasm/lesions, cardiorespiratory compromise, and reaction to medications. Opportunity for questions was given and appropriate answers were provided. Patient/patient representative has verbalized understanding is amenable to undergoing the procedure.   Annamaria Helling, DO  [facility] Gastroenterology  Portions of the record may have been created with voice recognition software. Occasional wrong-word or 'sound-a-like' substitutions may have occurred due to the inherent limitations of voice recognition software.  Read the chart carefully and recognize, using context, where substitutions may have occurred.

## 2022-11-05 NOTE — Transfer of Care (Signed)
Immediate Anesthesia Transfer of Care Note  Patient: Rodarius Kichline [facility]  Procedure(s) Performed: COLONOSCOPY WITH PROPOFOL ESOPHAGOGASTRODUODENOSCOPY (EGD) WITH PROPOFOL  Patient Location: PACU  Anesthesia Type:General  Level of Consciousness: sedated  Airway & Oxygen Therapy: Patient Spontanous Breathing and Patient connected to nasal cannula oxygen  Post-op Assessment: Report given to RN and Post -op Vital signs reviewed and stable  Post vital signs: Reviewed and stable  Last Vitals:  Vitals Value Taken Time  BP 102/68 11/05/22 1446  Temp 36.3 C 11/05/22 1445  Pulse 69 11/05/22 1446  Resp 11 11/05/22 1446  SpO2 97 % 11/05/22 1446    Last Pain:  Vitals:   11/05/22 1445  TempSrc: Temporal  PainSc: Asleep         Complications: No notable events documented.

## 2022-11-05 NOTE — Anesthesia Preprocedure Evaluation (Signed)
Anesthesia Evaluation  Patient identified by MRN, date of birth, ID band Patient awake    Reviewed: Allergy & Precautions, NPO status , Patient's Chart, lab work & pertinent test results  Airway Mallampati: III  TM Distance: >3 FB Neck ROM: Full    Dental  (+) Teeth Intact   Pulmonary neg pulmonary ROS, sleep apnea , Patient abstained from smoking., former smoker   Pulmonary exam normal  + decreased breath sounds      Cardiovascular Exercise Tolerance: Good hypertension, Pt. on medications + CAD and + Peripheral Vascular Disease  negative cardio ROS Normal cardiovascular exam Rhythm:Regular     Neuro/Psych negative neurological ROS  negative psych ROS   GI/Hepatic negative GI ROS, Neg liver ROS,GERD  Medicated,,  Endo/Other  negative endocrine ROSdiabetes, Well Controlled, Type 2, Oral Hypoglycemic Agents  Morbid obesity  Renal/GU negative Renal ROS  negative genitourinary   Musculoskeletal  (+) Arthritis ,    Abdominal  (+) + obese  Peds negative pediatric ROS (+)  Hematology negative hematology ROS (+)   Anesthesia Other Findings Past Medical History: No date: Arthritis No date: Bleeding disorder (HCC) No date: Cancer ([place]) No date: Diabetes mellitus without complication (HCC) No date: GERD (gastroesophageal reflux disease) No date: Hypertension  Past Surgical History: No date: APPENDECTOMY No date: CARPAL TUNNEL RELEASE 09/25/2017: COLONOSCOPY WITH PROPOFOL; N/A     Comment:  Procedure: COLONOSCOPY WITH PROPOFOL;  Surgeon: Toledo,               Benay Pike, MD;  Location: [facility] ENDOSCOPY;  Service:               Endoscopy;  Laterality: N/A; 09/25/2017: ESOPHAGOGASTRODUODENOSCOPY (EGD) WITH PROPOFOL; N/A     Comment:  Procedure: ESOPHAGOGASTRODUODENOSCOPY (EGD) WITH               PROPOFOL;  Surgeon: Toledo, Benay Pike, MD;  Location:               [facility] ENDOSCOPY;  Service: Endoscopy;  Laterality: N/A; No  date: GALLBLADDER SURGERY No date: REPLACEMENT TOTAL KNEE; Bilateral  BMI    Body Mass Index: 38.40 kg/m      Reproductive/Obstetrics negative OB ROS                             Anesthesia Physical Anesthesia Plan  ASA: 3  Anesthesia Plan: General   Post-op Pain Management:    Induction: Intravenous  PONV Risk Score and Plan: Propofol infusion and TIVA  Airway Management Planned: Natural Airway  Additional Equipment:   Intra-op Plan:   Post-operative Plan:   Informed Consent: I have reviewed the patients History and Physical, chart, labs and discussed the procedure including the risks, benefits and alternatives for the proposed anesthesia with the patient or authorized representative who has indicated his/her understanding and acceptance.     Dental Advisory Given  Plan Discussed with: CRNA and Surgeon  Anesthesia Plan Comments:        Anesthesia Quick Evaluation

## 2022-11-05 NOTE — Interval H&P Note (Signed)
History and Physical Interval Note: Preprocedure H&P from 11/05/22  was reviewed and there was no interval change after seeing and examining the patient.  Written consent was obtained from the patient after discussion of risks, benefits, and alternatives. Patient has consented to proceed with Esophagogastroduodenoscopy and Colonoscopy with possible intervention   11/05/2022 1:49 PM  Jimmy Greer  has presented today for surgery, with the diagnosis of GERD PH Colon Polyps.  The various methods of treatment have been discussed with the patient and family. After consideration of risks, benefits and other options for treatment, the patient has consented to  Procedure(s) with comments: COLONOSCOPY WITH PROPOFOL (N/A) - PM CASE DUE TO SPOUSE HAS DEMENTIA. PM IS WHEN HE HAS SOMEONE TO HELP.Marland Kitchen ESOPHAGOGASTRODUODENOSCOPY (EGD) WITH PROPOFOL (N/A) as a surgical intervention.  The patient's history has been reviewed, patient examined, no change in status, stable for surgery.  I have reviewed the patient's chart and labs.  Questions were answered to the patient's satisfaction.     Annamaria Helling

## 2022-11-06 ENCOUNTER — Encounter: Payer: Self-pay | Admitting: Gastroenterology

## 2022-11-06 NOTE — Anesthesia Postprocedure Evaluation (Signed)
Anesthesia Post Note  Patient: Jimmy Greer [facility]  Procedure(s) Performed: COLONOSCOPY WITH PROPOFOL ESOPHAGOGASTRODUODENOSCOPY (EGD) WITH PROPOFOL  Anesthesia Type: General Level of consciousness: awake Pain management: pain level controlled Vital Signs Assessment: post-procedure vital signs reviewed and stable Respiratory status: spontaneous breathing and respiratory function stable Cardiovascular status: stable Anesthetic complications: no  No notable events documented.   Last Vitals:  Vitals:   11/05/22 1455 11/05/22 1505  BP: 126/81 (!) 130/91  Pulse: 80 69  Resp: 19 14  Temp:    SpO2: 97% 97%    Last Pain:  Vitals:   11/05/22 1505  TempSrc:   PainSc: 0-No pain                 VAN STAVEREN,Melanie Openshaw

## 2022-11-09 LAB — SURGICAL PATHOLOGY

## 2023-01-07 ENCOUNTER — Encounter: Payer: Self-pay | Admitting: Family Medicine

## 2023-01-21 ENCOUNTER — Telehealth: Payer: Self-pay | Admitting: *Deleted

## 2023-01-21 NOTE — Telephone Encounter (Signed)
Nurse placed call to patient to review appointment details for upcoming new patient consultation visit. Nurse verified appointment time and details, patient verbalized understanding and denies any questions or concerns.

## 2023-01-22 ENCOUNTER — Inpatient Hospital Stay: Payer: Medicare Other | Attending: Oncology | Admitting: Oncology

## 2023-01-22 ENCOUNTER — Inpatient Hospital Stay: Payer: Medicare Other

## 2023-01-22 ENCOUNTER — Encounter: Payer: Self-pay | Admitting: Oncology

## 2023-01-22 VITALS — BP 125/82 | HR 63 | Temp 97.2°F | Ht 68.0 in | Wt 253.0 lb

## 2023-01-22 DIAGNOSIS — Z8042 Family history of malignant neoplasm of prostate: Secondary | ICD-10-CM | POA: Insufficient documentation

## 2023-01-22 DIAGNOSIS — Z79899 Other long term (current) drug therapy: Secondary | ICD-10-CM | POA: Insufficient documentation

## 2023-01-22 DIAGNOSIS — R7989 Other specified abnormal findings of blood chemistry: Secondary | ICD-10-CM | POA: Diagnosis present

## 2023-01-22 DIAGNOSIS — D61818 Other pancytopenia: Secondary | ICD-10-CM | POA: Diagnosis present

## 2023-01-22 DIAGNOSIS — Z87891 Personal history of nicotine dependence: Secondary | ICD-10-CM | POA: Diagnosis not present

## 2023-01-22 DIAGNOSIS — Z7901 Long term (current) use of anticoagulants: Secondary | ICD-10-CM | POA: Insufficient documentation

## 2023-01-22 DIAGNOSIS — E119 Type 2 diabetes mellitus without complications: Secondary | ICD-10-CM | POA: Insufficient documentation

## 2023-01-22 DIAGNOSIS — Z7984 Long term (current) use of oral hypoglycemic drugs: Secondary | ICD-10-CM | POA: Diagnosis not present

## 2023-01-22 DIAGNOSIS — Z923 Personal history of irradiation: Secondary | ICD-10-CM | POA: Diagnosis not present

## 2023-01-22 DIAGNOSIS — I10 Essential (primary) hypertension: Secondary | ICD-10-CM | POA: Diagnosis not present

## 2023-01-22 DIAGNOSIS — Z8546 Personal history of malignant neoplasm of prostate: Secondary | ICD-10-CM

## 2023-01-22 DIAGNOSIS — R718 Other abnormality of red blood cells: Secondary | ICD-10-CM

## 2023-01-22 LAB — CBC
HCT: 38.2 % — ABNORMAL LOW (ref 39.0–52.0)
Hemoglobin: 13.2 g/dL (ref 13.0–17.0)
MCH: 29.9 pg (ref 26.0–34.0)
MCHC: 34.6 g/dL (ref 30.0–36.0)
MCV: 86.4 fL (ref 80.0–100.0)
Platelets: 147 10*3/uL — ABNORMAL LOW (ref 150–400)
RBC: 4.42 MIL/uL (ref 4.22–5.81)
RDW: 15.9 % — ABNORMAL HIGH (ref 11.5–15.5)
WBC: 3.4 10*3/uL — ABNORMAL LOW (ref 4.0–10.5)
nRBC: 0.6 % — ABNORMAL HIGH (ref 0.0–0.2)

## 2023-01-22 LAB — VITAMIN B12: Vitamin B-12: 273 pg/mL (ref 180–914)

## 2023-01-22 LAB — IRON AND TIBC
Iron: 74 ug/dL (ref 45–182)
Saturation Ratios: 17 % — ABNORMAL LOW (ref 17.9–39.5)
TIBC: 427 ug/dL (ref 250–450)
UIBC: 353 ug/dL

## 2023-01-22 LAB — LACTATE DEHYDROGENASE: LDH: 174 U/L (ref 98–192)

## 2023-01-22 LAB — FOLATE: Folate: 32 ng/mL (ref >5.9)

## 2023-01-22 LAB — DAT, POLYSPECIFIC AHG (ARMC ONLY): Polyspecific AHG test: NEGATIVE

## 2023-01-22 LAB — FERRITIN: Ferritin: 27 ng/mL (ref 24–336)

## 2023-01-23 NOTE — Progress Notes (Signed)
[place]  [phone] Fax:(336) [phone]  ID: [name] OB: March 15, 1941  MR#: [id number]  MLY#:[id number]  Patient Care Team: Idelle Crouch, MD as PCP - General (Internal Medicine)  CHIEF COMPLAINT: Pancytopenia.  INTERVAL HISTORY: Patient is a 82 year old male who was noted to have a mildly decreased white blood cell count and platelet count on routine blood work.  He is referred for further evaluation.  Currently feels well and is asymptomatic.  He denies any recent fevers or illnesses.  He has a good appetite and denies weight loss.  He has no neurologic complaints.  He has no chest pain, shortness of breath, cough, or hemoptysis.  He denies any nausea, vomiting, constipation, or diarrhea.  He has no urinary complaints.  Patient feels at his baseline and offers no specific complaints today.  REVIEW OF SYSTEMS:   Review of Systems  Constitutional: Negative.  Negative for fever, malaise/fatigue and weight loss.  Respiratory: Negative.  Negative for cough, hemoptysis and shortness of breath.   Cardiovascular: Negative.  Negative for chest pain and leg swelling.  Gastrointestinal: Negative.  Negative for abdominal pain.  Genitourinary: Negative.  Negative for dysuria.  Musculoskeletal: Negative.  Negative for back pain.  Skin: Negative.  Negative for rash.  Neurological: Negative.  Negative for dizziness, focal weakness, weakness and headaches.  Psychiatric/Behavioral: Negative.  The patient is not nervous/anxious.     As per HPI. Otherwise, a complete review of systems is negative.  PAST MEDICAL HISTORY: Past Medical History:  Diagnosis Date   Arthritis    Bleeding disorder ([place])    Cancer ([place])    Diabetes mellitus without complication ([place])    GERD (gastroesophageal reflux disease)    Hypertension     PAST SURGICAL HISTORY: Past Surgical History:  Procedure Laterality Date   APPENDECTOMY     CARPAL TUNNEL RELEASE      COLONOSCOPY WITH PROPOFOL N/A 09/25/2017   Procedure: COLONOSCOPY WITH PROPOFOL;  Surgeon: Toledo, Benay Pike, MD;  Location: [facility] ENDOSCOPY;  Service: Endoscopy;  Laterality: N/A;   COLONOSCOPY WITH PROPOFOL N/A 11/05/2022   Procedure: COLONOSCOPY WITH PROPOFOL;  Surgeon: Annamaria Helling, DO;  Location: [facility] ENDOSCOPY;  Service: Gastroenterology;  Laterality: N/A;  PM CASE DUE TO SPOUSE HAS DEMENTIA. PM IS WHEN HE HAS SOMEONE TO HELP.Marland Kitchen   ESOPHAGOGASTRODUODENOSCOPY (EGD) WITH PROPOFOL N/A 09/25/2017   Procedure: ESOPHAGOGASTRODUODENOSCOPY (EGD) WITH PROPOFOL;  Surgeon: Toledo, Benay Pike, MD;  Location: [facility] ENDOSCOPY;  Service: Endoscopy;  Laterality: N/A;   ESOPHAGOGASTRODUODENOSCOPY (EGD) WITH PROPOFOL N/A 11/05/2022   Procedure: ESOPHAGOGASTRODUODENOSCOPY (EGD) WITH PROPOFOL;  Surgeon: Annamaria Helling, DO;  Location: [place];  Service: Gastroenterology;  Laterality: N/A;   GALLBLADDER SURGERY     REPLACEMENT TOTAL KNEE Bilateral     FAMILY HISTORY: Family History  Problem Relation Age of Onset   Prostate cancer Brother    Bladder Cancer Neg Hx    Kidney cancer Neg Hx     ADVANCED DIRECTIVES (Y/N):  N  HEALTH MAINTENANCE: Social History   Tobacco Use   Smoking status: Former   Smokeless tobacco: Never   Tobacco comments:    quit 35 years  Vaping Use   Vaping Use: Never used  Substance Use Topics   Alcohol use: Yes   Drug use: No     Colonoscopy:  PAP:  Bone density:  Lipid panel:  No Known Allergies  Current Outpatient Medications  Medication Sig Dispense Refill   allopurinol (ZYLOPRIM) 300 MG tablet TAKE 1  TABLET ONE TIME DAILY     Ascorbic Acid (VITAMIN C) 1000 MG tablet Take 1,000 mg by mouth daily.     aspirin EC 81 MG tablet Take by mouth.     gabapentin (NEURONTIN) 600 MG tablet TAKE 1 TABLET THREE TIMES DAILY     glimepiride (AMARYL) 2 MG tablet Take 2 mg by mouth daily with breakfast.     glucose blood ([employer] BLOOD GLUCOSE TEST) test  strip 1 each (1 strip total) once daily     hydrochlorothiazide (HYDRODIURIL) 25 MG tablet Take by mouth.     HYDROcodone-acetaminophen (NORCO/VICODIN) 5-325 MG tablet 1 po q6h prn     Melatonin 10 MG TABS Take 10 mg by mouth Nightly.     meloxicam (MOBIC) 7.5 MG tablet Take 7.5 mg by mouth daily.     metFORMIN (GLUCOPHAGE) 1000 MG tablet Take 1,000 mg by mouth 2 (two) times daily with a meal.     methocarbamol (ROBAXIN) 750 MG tablet Take 750 mg by mouth 2 (two) times daily.     metoprolol succinate (TOPROL-XL) 25 MG 24 hr tablet Take 25 mg by mouth daily.     Multiple Vitamin (MULTI-VITAMINS) TABS Take by mouth.     omeprazole (PRILOSEC) 40 MG capsule TAKE 1 CAPSULE TWICE DAILY     ONETOUCH VERIO test strip daily.     pioglitazone (ACTOS) 30 MG tablet Take 30 mg by mouth daily.     sildenafil (REVATIO) 20 MG tablet 1 tablet daily 90 tablet 3   simvastatin (ZOCOR) 40 MG tablet TAKE 1 TABLET EVERY NIGHT     simvastatin (ZOCOR) 40 MG tablet Take 1 tablet by mouth at bedtime.     tamsulosin (FLOMAX) 0.4 MG CAPS capsule TAKE 1 CAPSULE TWICE DAILY     torsemide (DEMADEX) 20 MG tablet Take by mouth.     traMADol (ULTRAM) 50 MG tablet Take by mouth every 6 (six) hours as needed.     warfarin (COUMADIN) 3 MG tablet Take 3 mg by mouth. Five days per week     warfarin (COUMADIN) 5 MG tablet Take by mouth. 2 days per week     No current facility-administered medications for this visit.    OBJECTIVE: Vitals:   01/22/23 1341  BP: 125/82  Pulse: 63  Temp: (!) 97.2 F (36.2 C)  SpO2: 98%     Body mass index is 38.47 kg/m.    ECOG FS:0 - Asymptomatic  General: Well-developed, well-nourished, no acute distress. Eyes: Pink conjunctiva, anicteric sclera. HEENT: Normocephalic, moist mucous membranes. Lungs: No audible wheezing or coughing. Heart: Regular rate and rhythm. Abdomen: Soft, nontender, no obvious distention. Musculoskeletal: No edema, cyanosis, or clubbing. Neuro: Alert, answering  all questions appropriately. Cranial nerves grossly intact. Skin: No rashes or petechiae noted. Psych: Normal affect. Lymphatics: No cervical, calvicular, axillary or inguinal LAD.   LAB RESULTS:  Lab Results  Component Value Date   NA 137 01/07/2013   K 3.8 01/07/2013   CL 105 01/07/2013   CO2 25 01/07/2013   GLUCOSE 117 (H) 01/07/2013   BUN 11 01/07/2013   CREATININE 1.30 (H) 10/03/2022   CALCIUM 7.9 (L) 01/07/2013   PROT 7.0 03/06/2012   ALBUMIN 3.7 03/06/2012   AST 33 03/06/2012   ALT 23 03/06/2012   ALKPHOS 65 03/06/2012   BILITOT 0.4 03/06/2012   GFRNONAA >60 01/07/2013   GFRAA >60 01/07/2013    Lab Results  Component Value Date   WBC 3.4 (L) 01/22/2023   HGB  13.2 01/22/2023   HCT 38.2 (L) 01/22/2023   MCV 86.4 01/22/2023   PLT 147 (L) 01/22/2023     STUDIES: No results found.  ASSESSMENT: Pancytopenia.  PLAN:    Pancytopenia: Patient noted to have a mildly decreased white blood cell count as well as platelet count.  Hemoglobin is within normal limits.  All of his other laboratory work is also either negative or within normal limits.  SPEP, peripheral blood flow cytometry, and [employer] myeloid panel are pending at time of dictation.  No intervention is needed at this time.  Patient does not require bone marrow biopsy.  Patient will have video-assisted telemedicine visit in 3 weeks to discuss his results. Prostate cancer: Status post XRT.  Continue follow-up with radiation oncology as scheduled.  I spent a total of 45 minutes reviewing chart data, face-to-face evaluation with the patient, counseling and coordination of care as detailed above.   Patient expressed understanding and was in agreement with this plan. He also understands that He can call clinic at any time with any questions, concerns, or complaints.    Lloyd Huger, MD   01/23/2023 3:28 PM

## 2023-01-24 LAB — COMP PANEL: LEUKEMIA/LYMPHOMA

## 2023-01-24 LAB — HAPTOGLOBIN: Haptoglobin: 72 mg/dL (ref 34–355)

## 2023-01-25 LAB — PROTEIN ELECTROPHORESIS, SERUM
A/G Ratio: 1.6 (ref 0.7–1.7)
Albumin ELP: 3.7 g/dL (ref 2.9–4.4)
Alpha-1-Globulin: 0.3 g/dL (ref 0.0–0.4)
Alpha-2-Globulin: 0.6 g/dL (ref 0.4–1.0)
Beta Globulin: 0.9 g/dL (ref 0.7–1.3)
Gamma Globulin: 0.6 g/dL (ref 0.4–1.8)
Globulin, Total: 2.3 g/dL (ref 2.2–3.9)
Total Protein ELP: 6 g/dL (ref 6.0–8.5)

## 2023-02-08 LAB — INTELLIGEN MYELOID

## 2023-02-12 ENCOUNTER — Encounter: Payer: Self-pay | Admitting: Oncology

## 2023-02-12 ENCOUNTER — Inpatient Hospital Stay (HOSPITAL_BASED_OUTPATIENT_CLINIC_OR_DEPARTMENT_OTHER): Payer: Medicare Other | Admitting: Oncology

## 2023-02-12 DIAGNOSIS — D61818 Other pancytopenia: Secondary | ICD-10-CM

## 2023-02-12 NOTE — Progress Notes (Unsigned)
[place]  [phone]) [phone] Fax:(336) [phone]  ID: Jimmy Greer OB: [birth year]  MR#: [id number]  [id number]  Patient Care Team: Idelle Crouch, MD as PCP - General (Internal Medicine)  I connected with Jimmy Greer on 02/13/23 at  2:15 PM EST by video enabled telemedicine visit and verified that I am speaking with the correct person using two identifiers.   I discussed the limitations, risks, security and privacy concerns of performing an evaluation and management service by telemedicine and the availability of in-person appointments. I also discussed with the patient that there may be a patient responsible charge related to this service. The patient expressed understanding and agreed to proceed.   Other persons participating in the visit and their role in the encounter: Patient, MD.  Patient's location: Home. Provider's location: Clinic.  CHIEF COMPLAINT: Pancytopenia, possible MDS.  INTERVAL HISTORY: Patient agreed to video assisted telemedicine visit for further evaluation and discussion of his laboratory results.  He continues to feel well and remains asymptomatic.  He does not complain of any weakness or fatigue.  He denies any recent fevers or illnesses.  He has a good appetite and denies weight loss.  He has no neurologic complaints.  He has no chest pain, shortness of breath, cough, or hemoptysis.  He denies any nausea, vomiting, constipation, or diarrhea.  He has no urinary complaints.  Patient offers no complaints today.  REVIEW OF SYSTEMS:   Review of Systems  Constitutional: Negative.  Negative for fever, malaise/fatigue and weight loss.  Respiratory: Negative.  Negative for cough, hemoptysis and shortness of breath.   Cardiovascular: Negative.  Negative for chest pain and leg swelling.  Gastrointestinal: Negative.  Negative for abdominal pain.  Genitourinary: Negative.  Negative for dysuria.  Musculoskeletal: Negative.  Negative  for back pain.  Skin: Negative.  Negative for rash.  Neurological: Negative.  Negative for dizziness, focal weakness, weakness and headaches.  Psychiatric/Behavioral: Negative.  The patient is not nervous/anxious.     As per HPI. Otherwise, a complete review of systems is negative.  PAST MEDICAL HISTORY: Past Medical History:  Diagnosis Date   Arthritis    Bleeding disorder ([place])    Cancer ([place])    Diabetes mellitus without complication ([place])    GERD (gastroesophageal reflux disease)    Hypertension     PAST SURGICAL HISTORY: Past Surgical History:  Procedure Laterality Date   APPENDECTOMY     CARPAL TUNNEL RELEASE     COLONOSCOPY WITH PROPOFOL N/A 09/25/2017   Procedure: COLONOSCOPY WITH PROPOFOL;  Surgeon: Toledo, Benay Pike, MD;  Location: [facility] ENDOSCOPY;  Service: Endoscopy;  Laterality: N/A;   COLONOSCOPY WITH PROPOFOL N/A 11/05/2022   Procedure: COLONOSCOPY WITH PROPOFOL;  Surgeon: Annamaria Helling, DO;  Location: [facility] ENDOSCOPY;  Service: Gastroenterology;  Laterality: N/A;  PM CASE DUE TO SPOUSE HAS DEMENTIA. PM IS WHEN HE HAS SOMEONE TO HELP.Marland Kitchen   ESOPHAGOGASTRODUODENOSCOPY (EGD) WITH PROPOFOL N/A 09/25/2017   Procedure: ESOPHAGOGASTRODUODENOSCOPY (EGD) WITH PROPOFOL;  Surgeon: Toledo, Benay Pike, MD;  Location: [facility] ENDOSCOPY;  Service: Endoscopy;  Laterality: N/A;   ESOPHAGOGASTRODUODENOSCOPY (EGD) WITH PROPOFOL N/A 11/05/2022   Procedure: ESOPHAGOGASTRODUODENOSCOPY (EGD) WITH PROPOFOL;  Surgeon: Annamaria Helling, DO;  Location: [place];  Service: Gastroenterology;  Laterality: N/A;   GALLBLADDER SURGERY     REPLACEMENT TOTAL KNEE Bilateral     FAMILY HISTORY: Family History  Problem Relation Age of Onset   Prostate cancer Brother    Bladder Cancer Neg Hx  Kidney cancer Neg Hx     ADVANCED DIRECTIVES (Y/N):  N  HEALTH MAINTENANCE: Social History   Tobacco Use   Smoking status: Former   Smokeless tobacco: Never   Tobacco comments:    quit  35 years  Vaping Use   Vaping Use: Never used  Substance Use Topics   Alcohol use: Yes   Drug use: No     Colonoscopy:  PAP:  Bone density:  Lipid panel:  No Known Allergies  Current Outpatient Medications  Medication Sig Dispense Refill   allopurinol (ZYLOPRIM) 300 MG tablet TAKE 1 TABLET ONE TIME DAILY     Ascorbic Acid (VITAMIN C) 1000 MG tablet Take 1,000 mg by mouth daily.     aspirin EC 81 MG tablet Take by mouth.     gabapentin (NEURONTIN) 600 MG tablet TAKE 1 TABLET THREE TIMES DAILY     glimepiride (AMARYL) 2 MG tablet Take 2 mg by mouth daily with breakfast.     glucose blood ([employer] BLOOD GLUCOSE TEST) test strip 1 each (1 strip total) once daily     hydrochlorothiazide (HYDRODIURIL) 25 MG tablet Take by mouth.     Melatonin 10 MG TABS Take 10 mg by mouth Nightly.     meloxicam (MOBIC) 7.5 MG tablet Take 7.5 mg by mouth daily.     metFORMIN (GLUCOPHAGE) 1000 MG tablet Take 1,000 mg by mouth 2 (two) times daily with a meal.     methocarbamol (ROBAXIN) 750 MG tablet Take 750 mg by mouth 2 (two) times daily.     metoprolol succinate (TOPROL-XL) 25 MG 24 hr tablet Take 25 mg by mouth daily.     Multiple Vitamin (MULTI-VITAMINS) TABS Take by mouth.     omeprazole (PRILOSEC) 40 MG capsule TAKE 1 CAPSULE TWICE DAILY     ONETOUCH VERIO test strip daily.     pioglitazone (ACTOS) 30 MG tablet Take 30 mg by mouth daily.     sildenafil (REVATIO) 20 MG tablet 1 tablet daily 90 tablet 3   simvastatin (ZOCOR) 40 MG tablet TAKE 1 TABLET EVERY NIGHT     tamsulosin (FLOMAX) 0.4 MG CAPS capsule TAKE 1 CAPSULE TWICE DAILY     torsemide (DEMADEX) 20 MG tablet Take by mouth.     traMADol (ULTRAM) 50 MG tablet Take by mouth every 6 (six) hours as needed.     warfarin (COUMADIN) 3 MG tablet Take 3 mg by mouth. Five days per week     warfarin (COUMADIN) 5 MG tablet Take by mouth. 2 days per week     HYDROcodone-acetaminophen (NORCO/VICODIN) 5-325 MG tablet 1 po q6h prn (Patient not taking:  Reported on 02/12/2023)     simvastatin (ZOCOR) 40 MG tablet Take 1 tablet by mouth at bedtime. (Patient not taking: Reported on 02/12/2023)     No current facility-administered medications for this visit.    OBJECTIVE: There were no vitals filed for this visit.    There is no height or weight on file to calculate BMI.    ECOG FS:0 - Asymptomatic  General: Well-developed, well-nourished, no acute distress. HEENT: Normocephalic. Neuro: Alert, answering all questions appropriately. Cranial nerves grossly intact. Psych: Normal affect.   LAB RESULTS:  Lab Results  Component Value Date   NA 137 01/07/2013   K 3.8 01/07/2013   CL 105 01/07/2013   CO2 25 01/07/2013   GLUCOSE 117 (H) 01/07/2013   BUN 11 01/07/2013   CREATININE 1.30 (H) 10/03/2022   CALCIUM 7.9 (L) 01/07/2013  PROT 7.0 03/06/2012   ALBUMIN 3.7 03/06/2012   AST 33 03/06/2012   ALT 23 03/06/2012   ALKPHOS 65 03/06/2012   BILITOT 0.4 03/06/2012   GFRNONAA >60 01/07/2013   GFRAA >60 01/07/2013    Lab Results  Component Value Date   WBC 3.4 (L) 01/22/2023   HGB 13.2 01/22/2023   HCT 38.2 (L) 01/22/2023   MCV 86.4 01/22/2023   PLT 147 (L) 01/22/2023     STUDIES: No results found.  ASSESSMENT: Pancytopenia.  PLAN:    Leukopenia/thrombocytopenia: Patient noted to have a mildly decreased white blood cell count as well as platelet count.  Hemoglobin is within normal limits. 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  A bone marrow biopsy would be necessary to confirm diagnosis, but this is not necessary at this point.  All of his other laboratory work is either negative or within normal limits including SPEP and peripheral blood flow cytometry.  No intervention is needed at this time.  Return to clinic in 4 months with laboratory work and video-assisted telemedicine visit. Prostate cancer: Status post XRT.  Continue  follow-up with radiation oncology as scheduled.  I provided 20 minutes of face-to-face video visit time during this encounter which included chart review, counseling, and coordination of care as documented above.    Patient expressed understanding and was in agreement with this plan. He also understands that He can call clinic at any time with any questions, concerns, or complaints.    Lloyd Huger, MD   02/13/2023 10:33 AM

## 2023-02-18 ENCOUNTER — Inpatient Hospital Stay: Payer: Medicare Other | Attending: Oncology

## 2023-02-18 DIAGNOSIS — R7989 Other specified abnormal findings of blood chemistry: Secondary | ICD-10-CM | POA: Diagnosis present

## 2023-02-18 DIAGNOSIS — C61 Malignant neoplasm of prostate: Secondary | ICD-10-CM

## 2023-02-18 DIAGNOSIS — D61818 Other pancytopenia: Secondary | ICD-10-CM | POA: Diagnosis present

## 2023-02-18 LAB — PSA: Prostatic Specific Antigen: 0.06 ng/mL (ref 0.00–4.00)

## 2023-02-25 ENCOUNTER — Ambulatory Visit
Admission: RE | Admit: 2023-02-25 | Discharge: 2023-02-25 | Disposition: A | Discharge status: Home / Self Care | Payer: Medicare Other | Source: Ambulatory Visit | Attending: Radiation Oncology | Admitting: Radiation Oncology

## 2023-02-25 ENCOUNTER — Encounter: Payer: Self-pay | Admitting: Radiation Oncology

## 2023-02-25 VITALS — BP 123/65 | HR 65 | Temp 96.0°F | Resp 18 | Ht 67.5 in | Wt 255.8 lb

## 2023-02-25 DIAGNOSIS — R350 Frequency of micturition: Secondary | ICD-10-CM | POA: Insufficient documentation

## 2023-02-25 DIAGNOSIS — C61 Malignant neoplasm of prostate: Secondary | ICD-10-CM | POA: Insufficient documentation

## 2023-02-25 DIAGNOSIS — Z923 Personal history of irradiation: Secondary | ICD-10-CM | POA: Insufficient documentation

## 2023-02-25 DIAGNOSIS — C775 Secondary and unspecified malignant neoplasm of intrapelvic lymph nodes: Secondary | ICD-10-CM | POA: Insufficient documentation

## 2023-02-25 NOTE — Progress Notes (Signed)
Radiation Oncology Follow up Note  Name: Jimmy Greer   Date:   02/25/2023 MRN:  [id number] DOB: 18-Nov-1941    This 82 y.o. male presents to the clinic today for 4-year follow-up status post IMRT radiation therapy to his prostate and pelvic nodes for stage IIb Gleason 8 adenocarcinoma of the prostate.  REFERRING PROVIDER: Idelle Crouch, MD  HPI: Patient is a 82 year old male now out 4 years having completed IMRT radiation therapy to his prostate and pelvic nodes for Gleason 8 (4+4) adenocarcinoma seen today in routine follow-up he still has some frequency of urination has been taking his Flomax 2 pills in the morning of asked him to stretch that out to 1 pill in the morning and 1 pill in the evening.  His most recent PSA.  Is 0.06 which was 1.05 back in January 2023.  COMPLICATIONS OF TREATMENT: none  FOLLOW UP COMPLIANCE: keeps appointments   PHYSICAL EXAM:  BP 123/65   Pulse 65   Temp (!) 96 F (35.6 C)   Resp 18   Ht 5' 7.5" (1.715 m)   Wt 255 lb 12.8 oz (116 kg)   BMI 39.47 kg/m  Well-developed well-nourished patient in NAD. HEENT reveals PERLA, EOMI, discs not visualized.  Oral cavity is clear. No oral mucosal lesions are identified. Neck is clear without evidence of cervical or supraclavicular adenopathy. Lungs are clear to A&P. Cardiac examination is essentially unremarkable with regular rate and rhythm without murmur rub or thrill. Abdomen is benign with no organomegaly or masses noted. Motor sensory and DTR levels are equal and symmetric in the upper and lower extremities. Cranial nerves II through XII are grossly intact. Proprioception is intact. No peripheral adenopathy or edema is identified. No motor or sensory levels are noted. Crude visual fields are within normal range.  RADIOLOGY RESULTS: No current films for review  PLAN: Present time patient is under excellent biochemical control of his prostate cancer.  He is seeing urology twice a year I am going to  discontinue follow-up care.  He will have follow-up PSAs performed by Dr. Bernardo Heater.  I be happy to reevaluate the patient in the future should that be indicated.  Patient is to call with any concerns.  I would like to take this opportunity to thank you for allowing me to participate in the care of your patient.Noreene Filbert, MD

## 2023-06-11 ENCOUNTER — Inpatient Hospital Stay: Payer: Medicare Other | Attending: Oncology

## 2023-06-11 ENCOUNTER — Inpatient Hospital Stay: Payer: Medicare Other

## 2023-06-11 DIAGNOSIS — D61818 Other pancytopenia: Secondary | ICD-10-CM | POA: Diagnosis present

## 2023-06-11 LAB — CBC WITH DIFFERENTIAL/PLATELET
Abs Immature Granulocytes: 0.03 10*3/uL (ref 0.00–0.07)
Basophils Absolute: 0 10*3/uL (ref 0.0–0.1)
Basophils Relative: 0 %
Eosinophils Absolute: 0.1 10*3/uL (ref 0.0–0.5)
Eosinophils Relative: 2 %
HCT: 35.4 % — ABNORMAL LOW (ref 39.0–52.0)
Hemoglobin: 11.9 g/dL — ABNORMAL LOW (ref 13.0–17.0)
Immature Granulocytes: 1 %
Lymphocytes Relative: 13 %
Lymphs Abs: 0.5 10*3/uL — ABNORMAL LOW (ref 0.7–4.0)
MCH: 29.2 pg (ref 26.0–34.0)
MCHC: 33.6 g/dL (ref 30.0–36.0)
MCV: 86.8 fL (ref 80.0–100.0)
Monocytes Absolute: 0.4 10*3/uL (ref 0.1–1.0)
Monocytes Relative: 10 %
Neutro Abs: 3.2 10*3/uL (ref 1.7–7.7)
Neutrophils Relative %: 74 %
Platelets: 158 10*3/uL (ref 150–400)
RBC: 4.08 MIL/uL — ABNORMAL LOW (ref 4.22–5.81)
RDW: 15.9 % — ABNORMAL HIGH (ref 11.5–15.5)
WBC: 4.3 10*3/uL (ref 4.0–10.5)
nRBC: 0.7 % — ABNORMAL HIGH (ref 0.0–0.2)

## 2023-06-13 ENCOUNTER — Telehealth: Payer: Medicare Other | Admitting: Oncology

## 2023-06-18 ENCOUNTER — Inpatient Hospital Stay: Payer: Medicare Other | Admitting: Oncology

## 2023-06-25 ENCOUNTER — Inpatient Hospital Stay: Payer: Medicare Other | Attending: Oncology | Admitting: Oncology

## 2023-06-25 ENCOUNTER — Encounter: Payer: Self-pay | Admitting: Oncology

## 2023-06-25 DIAGNOSIS — D61818 Other pancytopenia: Secondary | ICD-10-CM | POA: Diagnosis not present

## 2023-06-25 NOTE — Progress Notes (Signed)
[facility]  [phone] [phone]  ID: Jimmy Greer OB: 10-16-1941  MR#: [id number]  AOZ#:[id number]  Patient Care Team: Marguarite Arbour, MD as PCP - General (Internal Medicine)  I connected with Jimmy Greer on 06/25/23 at  9:30 AM EDT by video enabled telemedicine visit and verified that I am speaking with the correct person using two identifiers.   I discussed the limitations, risks, security and privacy concerns of performing an evaluation and management service by telemedicine and the availability of in-person appointments. I also discussed with the patient that there may be a patient responsible charge related to this service. The patient expressed understanding and agreed to proceed.   Other persons participating in the visit and their role in the encounter: Patient, MD.  Patient's location: Home. Provider's location: Clinic.  CHIEF COMPLAINT: Pancytopenia, possible MDS.  INTERVAL HISTORY: Patient agreed to video assisted telemedicine visit for discussion of his laboratory work and routine evaluation.  He recently had to place his wife in a memory care unit, but otherwise has been doing well.  He does not complain of any weakness or fatigue.  He denies any recent fevers or illnesses.  He has a good appetite and denies weight loss.  He has no neurologic complaints.  He has no chest pain, shortness of breath, cough, or hemoptysis.  He denies any nausea, vomiting, constipation, or diarrhea.  He has no urinary complaints.  Patient offers no further specific complaints today.  REVIEW OF SYSTEMS:   Review of Systems  Constitutional: Negative.  Negative for fever, malaise/fatigue and weight loss.  Respiratory: Negative.  Negative for cough, hemoptysis and shortness of breath.   Cardiovascular: Negative.  Negative for chest pain and leg swelling.  Gastrointestinal: Negative.  Negative for abdominal pain.  Genitourinary: Negative.  Negative  for dysuria.  Musculoskeletal: Negative.  Negative for back pain.  Skin: Negative.  Negative for rash.  Neurological: Negative.  Negative for dizziness, focal weakness, weakness and headaches.  Psychiatric/Behavioral: Negative.  The patient is not nervous/anxious.     As per HPI. Otherwise, a complete review of systems is negative.  PAST MEDICAL HISTORY: Past Medical History:  Diagnosis Date   Arthritis    Bleeding disorder (HCC)    Cancer (HCC)    Diabetes mellitus without complication (HCC)    GERD (gastroesophageal reflux disease)    Hypertension     PAST SURGICAL HISTORY: Past Surgical History:  Procedure Laterality Date   APPENDECTOMY     CARPAL TUNNEL RELEASE     COLONOSCOPY WITH PROPOFOL N/A 09/25/2017   Procedure: COLONOSCOPY WITH PROPOFOL;  Surgeon: Toledo, Boykin Nearing, MD;  Location: [facility] ENDOSCOPY;  Service: Endoscopy;  Laterality: N/A;   COLONOSCOPY WITH PROPOFOL N/A 11/05/2022   Procedure: COLONOSCOPY WITH PROPOFOL;  Surgeon: Jaynie Collins, DO;  Location: [facility] ENDOSCOPY;  Service: Gastroenterology;  Laterality: N/A;  PM CASE DUE TO SPOUSE HAS DEMENTIA. PM IS WHEN HE HAS SOMEONE TO HELP.Marland Kitchen   ESOPHAGOGASTRODUODENOSCOPY (EGD) WITH PROPOFOL N/A 09/25/2017   Procedure: ESOPHAGOGASTRODUODENOSCOPY (EGD) WITH PROPOFOL;  Surgeon: Toledo, Boykin Nearing, MD;  Location: [facility] ENDOSCOPY;  Service: Endoscopy;  Laterality: N/A;   ESOPHAGOGASTRODUODENOSCOPY (EGD) WITH PROPOFOL N/A 11/05/2022   Procedure: ESOPHAGOGASTRODUODENOSCOPY (EGD) WITH PROPOFOL;  Surgeon: Jaynie Collins, DO;  Location: [facility] ENDOSCOPY;  Service: Gastroenterology;  Laterality: N/A;   GALLBLADDER SURGERY     REPLACEMENT TOTAL KNEE Bilateral     FAMILY HISTORY: Family History  Problem Relation Age of Onset   Prostate  cancer Brother    Bladder Cancer Neg Hx    Kidney cancer Neg Hx     ADVANCED DIRECTIVES (Y/N):  N  HEALTH MAINTENANCE: Social History   Tobacco Use   Smoking status: Former    Smokeless tobacco: Never   Tobacco comments:    quit 35 years  Vaping Use   Vaping Use: Never used  Substance Use Topics   Alcohol use: Yes   Drug use: No     Colonoscopy:  PAP:  Bone density:  Lipid panel:  No Known Allergies  Current Outpatient Medications  Medication Sig Dispense Refill   allopurinol (ZYLOPRIM) 300 MG tablet TAKE 1 TABLET ONE TIME DAILY     Ascorbic Acid (VITAMIN C) 1000 MG tablet Take 1,000 mg by mouth daily.     aspirin EC 81 MG tablet Take by mouth.     gabapentin (NEURONTIN) 600 MG tablet TAKE 1 TABLET THREE TIMES DAILY     glimepiride (AMARYL) 2 MG tablet Take 2 mg by mouth daily with breakfast.     hydrochlorothiazide (HYDRODIURIL) 25 MG tablet Take by mouth.     Melatonin 10 MG TABS Take 10 mg by mouth Nightly.     meloxicam (MOBIC) 7.5 MG tablet Take 7.5 mg by mouth daily.     metFORMIN (GLUCOPHAGE) 1000 MG tablet Take 1,000 mg by mouth 2 (two) times daily with a meal.     methocarbamol (ROBAXIN) 750 MG tablet Take 750 mg by mouth 2 (two) times daily.     metoprolol succinate (TOPROL-XL) 25 MG 24 hr tablet Take 25 mg by mouth daily.     Multiple Vitamin (MULTI-VITAMINS) TABS Take by mouth.     omeprazole (PRILOSEC) 40 MG capsule TAKE 1 CAPSULE TWICE DAILY     ONETOUCH VERIO test strip daily.     pioglitazone (ACTOS) 30 MG tablet Take 30 mg by mouth daily.     sildenafil (REVATIO) 20 MG tablet 1 tablet daily 90 tablet 3   simvastatin (ZOCOR) 40 MG tablet TAKE 1 TABLET EVERY NIGHT     tamsulosin (FLOMAX) 0.4 MG CAPS capsule TAKE 1 CAPSULE TWICE DAILY     torsemide (DEMADEX) 20 MG tablet Take by mouth.     traMADol (ULTRAM) 50 MG tablet Take by mouth every 6 (six) hours as needed.     warfarin (COUMADIN) 3 MG tablet Take 3 mg by mouth. Five days per week     warfarin (COUMADIN) 5 MG tablet Take by mouth. 2 days per week     HYDROcodone-acetaminophen (NORCO/VICODIN) 5-325 MG tablet 1 po q6h prn (Patient not taking: Reported on 02/12/2023)      simvastatin (ZOCOR) 40 MG tablet Take 1 tablet by mouth at bedtime. (Patient not taking: Reported on 02/12/2023)     No current facility-administered medications for this visit.    OBJECTIVE: There were no vitals filed for this visit.    There is no height or weight on file to calculate BMI.    ECOG FS:0 - Asymptomatic  General: Well-developed, well-nourished, no acute distress. HEENT: Normocephalic. Neuro: Alert, answering all questions appropriately. Cranial nerves grossly intact. Psych: Normal affect.  LAB RESULTS:  Lab Results  Component Value Date   NA 137 01/07/2013   K 3.8 01/07/2013   CL 105 01/07/2013   CO2 25 01/07/2013   GLUCOSE 117 (H) 01/07/2013   BUN 11 01/07/2013   CREATININE 1.30 (H) 10/03/2022   CALCIUM 7.9 (L) 01/07/2013   PROT 7.0 03/06/2012   ALBUMIN  3.7 03/06/2012   AST 33 03/06/2012   ALT 23 03/06/2012   ALKPHOS 65 03/06/2012   BILITOT 0.4 03/06/2012   GFRNONAA >60 01/07/2013   GFRAA >60 01/07/2013    Lab Results  Component Value Date   WBC 4.3 06/11/2023   NEUTROABS 3.2 06/11/2023   HGB 11.9 (L) 06/11/2023   HCT 35.4 (L) 06/11/2023   MCV 86.8 06/11/2023   PLT 158 06/11/2023     STUDIES: No results found.  ASSESSMENT: Pancytopenia.  PLAN:    Leukopenia/thrombocytopenia: Resolved.  Hemoglobin is only mildly decreased.   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  A bone marrow biopsy would be necessary to confirm diagnosis, but this is not necessary at this point.  All of his other laboratory work is either negative or within normal limits including SPEP and peripheral blood flow cytometry.  No intervention is needed at this time.  After lengthy gust with the patient, it was agreed upon that no further follow-up is necessary.  Please continue to monitor CBC 1-2 times per year and refer patient back if there are any concerns. Prostate cancer:  Status post XRT.  Continue follow-up with radiation oncology as scheduled.  I provided 20 minutes of face-to-face video visit time during this encounter which included chart review, counseling, and coordination of care as documented above.   Patient expressed understanding and was in agreement with this plan. He also understands that He can call clinic at any time with any questions, concerns, or complaints.    Jeralyn Ruths, MD   06/25/2023 1:06 PM

## 2023-09-20 ENCOUNTER — Telehealth (INDEPENDENT_AMBULATORY_CARE_PROVIDER_SITE_OTHER): Payer: Self-pay

## 2023-09-20 NOTE — Telephone Encounter (Signed)
LVM for pt to call and schedule CT scan with radiology imaging and then call our office to get an appt with Dr Wyn Quaker for those CT results.  Any questions pt can call our office

## 2023-09-30 ENCOUNTER — Ambulatory Visit
Admission: RE | Admit: 2023-09-30 | Discharge: 2023-09-30 | Disposition: A | Discharge status: Home / Self Care | Payer: Medicare Other | Source: Ambulatory Visit | Attending: Vascular Surgery | Admitting: Vascular Surgery

## 2023-09-30 DIAGNOSIS — I7121 Aneurysm of the ascending aorta, without rupture: Secondary | ICD-10-CM | POA: Diagnosis present

## 2023-10-04 ENCOUNTER — Other Ambulatory Visit: Payer: Medicare Other

## 2023-10-18 ENCOUNTER — Ambulatory Visit (INDEPENDENT_AMBULATORY_CARE_PROVIDER_SITE_OTHER): Payer: Medicare Other | Admitting: Vascular Surgery

## 2023-10-22 ENCOUNTER — Encounter (INDEPENDENT_AMBULATORY_CARE_PROVIDER_SITE_OTHER): Payer: Self-pay | Admitting: Vascular Surgery

## 2023-10-22 ENCOUNTER — Ambulatory Visit (INDEPENDENT_AMBULATORY_CARE_PROVIDER_SITE_OTHER): Payer: Medicare Other | Admitting: Vascular Surgery

## 2023-10-22 VITALS — BP 102/69 | HR 78 | Resp 16 | Wt 266.0 lb

## 2023-10-22 DIAGNOSIS — I10 Essential (primary) hypertension: Secondary | ICD-10-CM

## 2023-10-22 DIAGNOSIS — E118 Type 2 diabetes mellitus with unspecified complications: Secondary | ICD-10-CM | POA: Diagnosis not present

## 2023-10-22 DIAGNOSIS — I2699 Other pulmonary embolism without acute cor pulmonale: Secondary | ICD-10-CM

## 2023-10-22 DIAGNOSIS — I7121 Aneurysm of the ascending aorta, without rupture: Secondary | ICD-10-CM | POA: Diagnosis not present

## 2023-10-22 DIAGNOSIS — M7989 Other specified soft tissue disorders: Secondary | ICD-10-CM

## 2023-10-22 NOTE — Assessment & Plan Note (Signed)
Compression socks and elevation have done a reasonable job of keeping his swelling under control.  He does have chronic swelling and postphlebitic changes, but overall this is stable.

## 2023-10-22 NOTE — Progress Notes (Signed)
MRN : [id number]  Jimmy Greer is a 82 y.o. (10/16/1941) male who presents with chief complaint of  Chief Complaint  Patient presents with   Follow-up    Ct results  .  History of Present Illness: Patient returns today in follow up of his ascending thoracic aortic aneurysm.  Last month, he underwent a CT scan of the chest which I have independently reviewed.  This demonstrated a roughly stable 4.4 cm ascending thoracic aortic aneurysm with slight increase in size over his previous studies.  No current aneurysm related symptoms.  No chest pain, back pain, or signs of peripheral embolization. He also has a history of DVT and PE.  His postphlebitic symptoms are reasonably well-controlled with daily compression sock use.  No new ulceration or infection.  Current Outpatient Medications  Medication Sig Dispense Refill   allopurinol (ZYLOPRIM) 300 MG tablet TAKE 1 TABLET ONE TIME DAILY     Ascorbic Acid (VITAMIN C) 1000 MG tablet Take 1,000 mg by mouth daily.     aspirin EC 81 MG tablet Take by mouth.     gabapentin (NEURONTIN) 600 MG tablet TAKE 1 TABLET THREE TIMES DAILY     glimepiride (AMARYL) 2 MG tablet Take 2 mg by mouth daily with breakfast.     hydrochlorothiazide (HYDRODIURIL) 25 MG tablet Take by mouth.     Melatonin 10 MG TABS Take 10 mg by mouth Nightly.     meloxicam (MOBIC) 7.5 MG tablet Take 7.5 mg by mouth daily.     metFORMIN (GLUCOPHAGE) 1000 MG tablet Take 1,000 mg by mouth 2 (two) times daily with a meal.     methocarbamol (ROBAXIN) 750 MG tablet Take 750 mg by mouth 2 (two) times daily.     metoprolol succinate (TOPROL-XL) 25 MG 24 hr tablet Take 25 mg by mouth daily.     Multiple Vitamin (MULTI-VITAMINS) TABS Take by mouth.     omeprazole (PRILOSEC) 40 MG capsule TAKE 1 CAPSULE TWICE DAILY     ONETOUCH VERIO test strip daily.     pioglitazone (ACTOS) 30 MG tablet Take 30 mg by mouth daily.     sildenafil (REVATIO) 20 MG tablet 1 tablet daily 90 tablet 3    simvastatin (ZOCOR) 40 MG tablet TAKE 1 TABLET EVERY NIGHT     tamsulosin (FLOMAX) 0.4 MG CAPS capsule TAKE 1 CAPSULE TWICE DAILY     torsemide (DEMADEX) 20 MG tablet Take by mouth.     traMADol (ULTRAM) 50 MG tablet Take by mouth every 6 (six) hours as needed.     warfarin (COUMADIN) 3 MG tablet Take 3 mg by mouth. Five days per week     warfarin (COUMADIN) 5 MG tablet Take by mouth. 2 days per week     HYDROcodone-acetaminophen (NORCO/VICODIN) 5-325 MG tablet 1 po q6h prn (Patient not taking: Reported on 02/12/2023)     simvastatin (ZOCOR) 40 MG tablet Take 1 tablet by mouth at bedtime. (Patient not taking: Reported on 02/12/2023)     No current facility-administered medications for this visit.    Past Medical History:  Diagnosis Date   Arthritis    Bleeding disorder (HCC)    Cancer (HCC)    Diabetes mellitus without complication (HCC)    GERD (gastroesophageal reflux disease)    Hypertension     Past Surgical History:  Procedure Laterality Date   APPENDECTOMY     CARPAL TUNNEL RELEASE     COLONOSCOPY WITH PROPOFOL N/A 09/25/2017   Procedure:  COLONOSCOPY WITH PROPOFOL;  Surgeon: Toledo, Boykin Nearing, MD;  Location: [facility] ENDOSCOPY;  Service: Endoscopy;  Laterality: N/A;   COLONOSCOPY WITH PROPOFOL N/A 11/05/2022   Procedure: COLONOSCOPY WITH PROPOFOL;  Surgeon: Jaynie Collins, DO;  Location: [facility] ENDOSCOPY;  Service: Gastroenterology;  Laterality: N/A;  PM CASE DUE TO SPOUSE HAS DEMENTIA. PM IS WHEN HE HAS SOMEONE TO HELP.Marland Kitchen   ESOPHAGOGASTRODUODENOSCOPY (EGD) WITH PROPOFOL N/A 09/25/2017   Procedure: ESOPHAGOGASTRODUODENOSCOPY (EGD) WITH PROPOFOL;  Surgeon: Toledo, Boykin Nearing, MD;  Location: [facility] ENDOSCOPY;  Service: Endoscopy;  Laterality: N/A;   ESOPHAGOGASTRODUODENOSCOPY (EGD) WITH PROPOFOL N/A 11/05/2022   Procedure: ESOPHAGOGASTRODUODENOSCOPY (EGD) WITH PROPOFOL;  Surgeon: Jaynie Collins, DO;  Location: [facility] ENDOSCOPY;  Service: Gastroenterology;  Laterality: N/A;    GALLBLADDER SURGERY     REPLACEMENT TOTAL KNEE Bilateral      Social History   Tobacco Use   Smoking status: Former   Smokeless tobacco: Never   Tobacco comments:    quit 35 years  Vaping Use   Vaping status: Never Used  Substance Use Topics   Alcohol use: Yes   Drug use: No      Family History  Problem Relation Age of Onset   Prostate cancer Brother    Bladder Cancer Neg Hx    Kidney cancer Neg Hx      No Known Allergies  REVIEW OF SYSTEMS (Negative unless checked)   Constitutional: [] Weight loss  [] Fever  [] Chills Cardiac: [] Chest pain   [] Chest pressure   [] Palpitations   [] Shortness of breath when laying flat   [x] Shortness of breath at rest   [x] Shortness of breath with exertion. Vascular:  [] Pain in legs with walking   [] Pain in legs at rest   [] Pain in legs when laying flat   [] Claudication   [] Pain in feet when walking  [] Pain in feet at rest  [] Pain in feet when laying flat   [x] History of DVT   [] Phlebitis   [x] Swelling in legs   [] Varicose veins   [] Non-healing ulcers Pulmonary:   [] Uses home oxygen   [] Productive cough   [] Hemoptysis   [] Wheeze  [] COPD   [] Asthma Neurologic:  [] Dizziness  [] Blackouts   [] Seizures   [] History of stroke   [] History of TIA  [] Aphasia   [] Temporary blindness   [] Dysphagia   [] Weakness or numbness in arms   [] Weakness or numbness in legs Musculoskeletal:  [x] Arthritis   [] Joint swelling   [] Joint pain   [] Low back pain Hematologic:  [] Easy bruising  [] Easy bleeding   [] Hypercoagulable state   [] Anemic  [] Hepatitis Gastrointestinal:  [] Blood in stool   [] Vomiting blood  [] Gastroesophageal reflux/heartburn   [] Abdominal pain Genitourinary:  [] Chronic kidney disease   [] Difficult urination  [] Frequent urination  [] Burning with urination   [] Hematuria Skin:  [] Rashes   [] Ulcers   [] Wounds Psychological:  [] History of anxiety   []  History of major depression  Physical Examination  BP 102/69 (BP Location: Left Arm)   Pulse 78   Resp  16   Wt 266 lb (120.7 kg)   BMI 41.05 kg/m  Gen:  WD/WN, NAD Head: [place]/AT, No temporalis wasting. Ear/Nose/Throat: Hearing grossly intact, nares w/o erythema or drainage Eyes: Conjunctiva clear. Sclera non-icteric Neck: Supple.  Trachea midline Pulmonary:  Good air movement, no use of accessory muscles.  Cardiac: RRR, no JVD Vascular:  Vessel Right Left  Radial Palpable Palpable       Musculoskeletal: M/S 5/5 throughout.  No deformity or atrophy. 1-2+ RLE edema, 1+  LLE edema. Neurologic: Sensation grossly intact in extremities.  Symmetrical.  Speech is fluent.  Psychiatric: Judgment intact, Mood & affect appropriate for pt's clinical situation. Dermatologic: No rashes or ulcers noted.  No cellulitis or open wounds.      Labs No results found for this or any previous visit (from the past 2160 hour(s)).  Radiology CT CHEST WO CONTRAST  Result Date: 10/15/2023 CLINICAL DATA:  Thoracic aortic aneurysm. EXAM: CT CHEST WITHOUT CONTRAST TECHNIQUE: Multidetector CT imaging of the chest was performed following the standard protocol without IV contrast. RADIATION DOSE REDUCTION: This exam was performed according to the departmental dose-optimization program which includes automated exposure control, adjustment of the mA and/or kV according to patient size and/or use of iterative reconstruction technique. COMPARISON:  October 03, 2022. FINDINGS: Cardiovascular: 4.4 cm ascending thoracic aortic aneurysm is noted. This appears to be enlarged compared to prior exam. Normal cardiac size. No pericardial effusion. Coronary artery calcifications are noted. Mediastinum/Nodes: No enlarged mediastinal or axillary lymph nodes. Thyroid gland, trachea, and esophagus demonstrate no significant findings. Lungs/Pleura: Lungs are clear. No pleural effusion or pneumothorax. Upper Abdomen: No acute abnormality. Musculoskeletal: No chest wall mass or suspicious bone lesions identified. IMPRESSION: 4.4 cm ascending  thoracic aortic aneurysm is noted which is slightly increased in size compared to prior exam. Recommend annual imaging followup by CTA or MRA. This recommendation follows 2010 ACCF/AHA/AATS/ACR/ASA/SCA/SCAI/SIR/STS/SVM Guidelines for the Diagnosis and Management of Patients with Thoracic Aortic Disease. Circulation. 2010; 121: Z610-R604. Aortic aneurysm NOS (ICD10-I71.9). Coronary artery calcifications are noted. Aortic Atherosclerosis (ICD10-I70.0). Electronically Signed   By: Lupita Raider M.D.   On: 10/15/2023 14:13    Assessment/Plan  Thoracic aortic aneurysm (TAA) (HCC) Slight increase in size now to 4.4 cm in maximal diameter in the ascending thoracic aorta.  No role for intervention at this size.  Continue to follow on an annual basis with CT scan of the chest.  Swelling of limb Compression socks and elevation have done a reasonable job of keeping his swelling under control.  He does have chronic swelling and postphlebitic changes, but overall this is stable.  HTN (hypertension) blood pressure control important in reducing the progression of atherosclerotic disease. On appropriate oral medications.     Type II diabetes mellitus with manifestations (HCC) blood glucose control important in reducing the progression of atherosclerotic disease. Also, involved in wound healing. On appropriate medications.     Pulmonary embolism (HCC) On coumadin.  He would be interested in moving to one of the newer agents once they are no longer cost prohibitive  Festus Barren, MD  10/22/2023 4:38 PM    This note was created with Dragon medical transcription system.  Any errors from dictation are purely unintentional

## 2023-10-22 NOTE — Assessment & Plan Note (Signed)
Slight increase in size now to 4.4 cm in maximal diameter in the ascending thoracic aorta.  No role for intervention at this size.  Continue to follow on an annual basis with CT scan of the chest.

## 2023-11-08 ENCOUNTER — Other Ambulatory Visit: Payer: Self-pay | Admitting: Physician Assistant

## 2023-11-08 DIAGNOSIS — R0609 Other forms of dyspnea: Secondary | ICD-10-CM

## 2023-11-08 DIAGNOSIS — R42 Dizziness and giddiness: Secondary | ICD-10-CM

## 2024-01-07 ENCOUNTER — Telehealth (HOSPITAL_COMMUNITY): Payer: Self-pay | Admitting: *Deleted

## 2024-01-07 NOTE — Telephone Encounter (Signed)
Reaching out to patient to offer assistance regarding upcoming cardiac imaging study; pt verbalizes understanding of appt date/time, parking situation and where to check in, pre-test NPO status and medications ordered, and verified current allergies; name and call back number provided for further questions should they arise Johney Frame RN Navigator Cardiac Imaging [facility] [phone] office [phone] cell  Patient denies metal, reports mild claustrophobia.

## 2024-01-08 ENCOUNTER — Ambulatory Visit
Admission: RE | Admit: 2024-01-08 | Discharge: 2024-01-08 | Disposition: A | Discharge status: Home / Self Care | Payer: Medicare Other | Source: Ambulatory Visit | Attending: Physician Assistant | Admitting: Physician Assistant

## 2024-01-08 ENCOUNTER — Other Ambulatory Visit: Payer: Self-pay | Admitting: Physician Assistant

## 2024-01-08 DIAGNOSIS — R42 Dizziness and giddiness: Secondary | ICD-10-CM

## 2024-01-08 DIAGNOSIS — I340 Nonrheumatic mitral (valve) insufficiency: Secondary | ICD-10-CM

## 2024-01-08 DIAGNOSIS — R0609 Other forms of dyspnea: Secondary | ICD-10-CM | POA: Diagnosis present

## 2024-01-08 MED ORDER — GADOBUTROL 1 MMOL/ML IV SOLN
15.0000 mL | Freq: Once | INTRAVENOUS | Status: AC | PRN
  Administered 2024-01-08: 15 mL via INTRAVENOUS

## 2024-03-31 ENCOUNTER — Telehealth: Payer: Self-pay | Admitting: *Deleted

## 2024-03-31 NOTE — Telephone Encounter (Signed)
 The patient wants a PSA lab as soon as cane be. His last one was 02/18/2023. Please call him abou this issue

## 2024-03-31 NOTE — Telephone Encounter (Signed)
 Ca;;ed patient to advise him that he is no longer under Dr. Whitney Hams care and advised him to call [facility] for follow up.

## 2024-04-02 ENCOUNTER — Telehealth: Payer: Self-pay | Admitting: Urology

## 2024-04-02 NOTE — Telephone Encounter (Signed)
 Patient called to schedule appointment with Dr. Cherylene Corrente.  He was last seen by Dr. Cherylene Corrente on 06/29/21, and last had PSA checked with Dr. Jacalyn Martin in March of last year. He said Dr. Jacalyn Martin wants him to follow up with Dr. Cherylene Corrente for this. Will he order a PSA lab prior to patient's visit on 05/07/24, or wait until after he is seen, since it has been almost 3 years since his last visit. Please advise.

## 2024-04-24 ENCOUNTER — Encounter (INDEPENDENT_AMBULATORY_CARE_PROVIDER_SITE_OTHER): Payer: Self-pay

## 2024-04-24 ENCOUNTER — Ambulatory Visit (INDEPENDENT_AMBULATORY_CARE_PROVIDER_SITE_OTHER): Admitting: Nurse Practitioner

## 2024-04-24 ENCOUNTER — Telehealth (INDEPENDENT_AMBULATORY_CARE_PROVIDER_SITE_OTHER): Payer: Self-pay

## 2024-04-24 VITALS — BP 115/68 | HR 67 | Resp 16

## 2024-04-24 DIAGNOSIS — M7989 Other specified soft tissue disorders: Secondary | ICD-10-CM | POA: Diagnosis not present

## 2024-04-24 NOTE — Progress Notes (Unsigned)
 History of Present Illness  There is no documented history at this time  Assessments & Plan   There are no diagnoses linked to this encounter.    Additional instructions  Subjective:  Patient presents with venous ulcer of the Bilateral lower extremity.    Procedure:  3 layer unna wrap was placed Bilateral lower extremity.   Plan:   Follow up in one week.

## 2024-04-24 NOTE — Telephone Encounter (Signed)
 Pt calls stating that his legs are oozing again like before when he was wrapped in unna boots I spoke to [employer] NP and she advised me to have him come in today for unna boots with a total of 4 visits.

## 2024-04-25 ENCOUNTER — Encounter (INDEPENDENT_AMBULATORY_CARE_PROVIDER_SITE_OTHER): Payer: Self-pay | Admitting: Nurse Practitioner

## 2024-04-29 ENCOUNTER — Telehealth (INDEPENDENT_AMBULATORY_CARE_PROVIDER_SITE_OTHER): Payer: Self-pay

## 2024-04-29 NOTE — Telephone Encounter (Signed)
 Patient left a voicemail stating that he is currently in the hospital and was concern with his legs weeping. Patient informed that if he is still in the hospital on Friday that his legs will be rewrapped there. Patient will contact the office on Thursday to informed if he is still in the hospital or will keep appointment as scheduled.

## 2024-05-01 ENCOUNTER — Encounter (INDEPENDENT_AMBULATORY_CARE_PROVIDER_SITE_OTHER)

## 2024-05-05 ENCOUNTER — Encounter (INDEPENDENT_AMBULATORY_CARE_PROVIDER_SITE_OTHER): Payer: Self-pay

## 2024-05-07 ENCOUNTER — Ambulatory Visit: Admitting: Urology

## 2024-05-08 ENCOUNTER — Encounter (INDEPENDENT_AMBULATORY_CARE_PROVIDER_SITE_OTHER): Payer: Self-pay

## 2024-05-08 ENCOUNTER — Ambulatory Visit (INDEPENDENT_AMBULATORY_CARE_PROVIDER_SITE_OTHER): Admitting: Nurse Practitioner

## 2024-05-08 VITALS — BP 107/66 | HR 108 | Resp 20 | Ht 69.0 in | Wt 241.0 lb

## 2024-05-08 DIAGNOSIS — M7989 Other specified soft tissue disorders: Secondary | ICD-10-CM

## 2024-05-08 NOTE — Progress Notes (Unsigned)
 History of Present Illness  There is no documented history at this time  Assessments & Plan   There are no diagnoses linked to this encounter.    Additional instructions  Subjective:  Patient presents with venous ulcer of the Bilateral lower extremity.    Procedure:  3 layer unna wrap was placed Bilateral lower extremity.   Plan:   Follow up in one week.

## 2024-05-15 ENCOUNTER — Ambulatory Visit (INDEPENDENT_AMBULATORY_CARE_PROVIDER_SITE_OTHER): Admitting: Nurse Practitioner

## 2024-05-15 ENCOUNTER — Encounter (INDEPENDENT_AMBULATORY_CARE_PROVIDER_SITE_OTHER): Payer: Self-pay

## 2024-05-15 VITALS — BP 100/62 | HR 83 | Resp 18

## 2024-05-15 DIAGNOSIS — M7989 Other specified soft tissue disorders: Secondary | ICD-10-CM

## 2024-05-15 NOTE — Progress Notes (Signed)
 Patient came in today for bilateral unna wraps. Patient currently has no weeping or swelling with both lower extremities. Patient will come out of wraps today but will keep follow up appointment as schedule.

## 2024-05-22 ENCOUNTER — Encounter (INDEPENDENT_AMBULATORY_CARE_PROVIDER_SITE_OTHER): Payer: Self-pay | Admitting: Nurse Practitioner

## 2024-05-22 ENCOUNTER — Ambulatory Visit (INDEPENDENT_AMBULATORY_CARE_PROVIDER_SITE_OTHER): Admitting: Nurse Practitioner

## 2024-05-22 VITALS — BP 80/54 | HR 80 | Resp 18 | Wt 245.0 lb

## 2024-05-22 DIAGNOSIS — I10 Essential (primary) hypertension: Secondary | ICD-10-CM

## 2024-05-22 DIAGNOSIS — E118 Type 2 diabetes mellitus with unspecified complications: Secondary | ICD-10-CM

## 2024-05-22 DIAGNOSIS — S51002A Unspecified open wound of left elbow, initial encounter: Secondary | ICD-10-CM

## 2024-05-22 DIAGNOSIS — M7989 Other specified soft tissue disorders: Secondary | ICD-10-CM

## 2024-05-22 MED ORDER — DOXYCYCLINE HYCLATE 100 MG PO CAPS: 100.0000 mg | ORAL_CAPSULE | Freq: Two times a day (BID) | ORAL | 0 refills | Status: DC

## 2024-06-01 ENCOUNTER — Encounter (INDEPENDENT_AMBULATORY_CARE_PROVIDER_SITE_OTHER): Payer: Self-pay | Admitting: Nurse Practitioner

## 2024-06-01 NOTE — Progress Notes (Signed)
 Subjective:    Patient ID: Jimmy Greer, male    DOB: 10-Jul-1941, 83 y.o.   MRN: [id number] Chief Complaint  Patient presents with   Follow-up    Unna wrap follow up    HPI  Review of Systems     Objective:   Physical Exam  BP (!) 80/54   Pulse 80   Resp 18   Wt 245 lb (111.1 kg)   BMI 36.18 kg/m   Past Medical History:  Diagnosis Date   Arthritis    Bleeding disorder (HCC)    Cancer (HCC)    Diabetes mellitus without complication (HCC)    GERD (gastroesophageal reflux disease)    Hypertension     Social History   Socioeconomic History   Marital status: Married    Spouse name: Not on file   Number of children: Not on file   Years of education: Not on file   Highest education level: Not on file  Occupational History   Not on file  Tobacco Use   Smoking status: Former   Smokeless tobacco: Never   Tobacco comments:    quit 35 years  Vaping Use   Vaping status: Never Used  Substance and Sexual Activity   Alcohol use: Yes   Drug use: No   Sexual activity: Yes  Other Topics Concern   Not on file  Social History Narrative   Not on file   Social Drivers of Health   Financial Resource Strain: Low Risk  (05/13/2024)   Received from [facility]   Overall Financial Resource Strain (CARDIA)    Difficulty of Paying Living Expenses: Not hard at all  Food Insecurity: No Food Insecurity (05/13/2024)   Received from [facility]   Hunger Vital Sign    Within the past 12 months, you worried that your food would run out before you got the money to buy more.: Never true    Within the past 12 months, the food you bought just didn't last and you didn't have money to get more.: Never true  Transportation Needs: No Transportation Needs (05/13/2024)   Received from [facility] - Transportation    In the past 12 months, has lack of transportation kept you from medical appointments or from getting  medications?: No    Lack of Transportation (Non-Medical): No  Physical Activity: Not on file  Stress: Not on file  Social Connections: Not on file  Intimate Partner Violence: Not on file    Past Surgical History:  Procedure Laterality Date   APPENDECTOMY     CARPAL TUNNEL RELEASE     COLONOSCOPY WITH PROPOFOL  N/A 09/25/2017   Procedure: COLONOSCOPY WITH PROPOFOL ;  Surgeon: Toledo, Alphonsus Jeans, MD;  Location: [facility] ENDOSCOPY;  Service: Endoscopy;  Laterality: N/A;   COLONOSCOPY WITH PROPOFOL  N/A 11/05/2022   Procedure: COLONOSCOPY WITH PROPOFOL ;  Surgeon: Quintin Buckle, DO;  Location: [facility] ENDOSCOPY;  Service: Gastroenterology;  Laterality: N/A;  PM CASE DUE TO SPOUSE HAS DEMENTIA. PM IS WHEN HE HAS SOMEONE TO HELP.Aaron Aas   ESOPHAGOGASTRODUODENOSCOPY (EGD) WITH PROPOFOL  N/A 09/25/2017   Procedure: ESOPHAGOGASTRODUODENOSCOPY (EGD) WITH PROPOFOL ;  Surgeon: Toledo, Alphonsus Jeans, MD;  Location: [facility] ENDOSCOPY;  Service: Endoscopy;  Laterality: N/A;   ESOPHAGOGASTRODUODENOSCOPY (EGD) WITH PROPOFOL  N/A 11/05/2022   Procedure: ESOPHAGOGASTRODUODENOSCOPY (EGD) WITH PROPOFOL ;  Surgeon: Quintin Buckle, DO;  Location: [facility] ENDOSCOPY;  Service: Gastroenterology;  Laterality: N/A;   GALLBLADDER SURGERY     REPLACEMENT  TOTAL KNEE Bilateral     Family History  Problem Relation Age of Onset   Prostate cancer Brother    Bladder Cancer Neg Hx    Kidney cancer Neg Hx     No Known Allergies     Latest Ref Rng & Units 06/11/2023    3:24 PM 01/22/2023    2:29 PM 01/07/2013    4:52 AM  CBC  WBC 4.0 - 10.5 K/uL 4.3  3.4    Hemoglobin 13.0 - 17.0 g/dL 40.9  81.1  9.0   Hematocrit 39.0 - 52.0 % 35.4  38.2    Platelets 150 - 400 K/uL 158  147  148       CMP     Component Value Date/Time   NA 137 01/07/2013 0452   K 3.8 01/07/2013 0452   CL 105 01/07/2013 0452   CO2 25 01/07/2013 0452   GLUCOSE 117 (H) 01/07/2013 0452   BUN 11 01/07/2013 0452   CREATININE 1.30 (H) 10/03/2022 1323    CREATININE 0.90 01/07/2013 0452   CALCIUM 7.9 (L) 01/07/2013 0452   PROT 7.0 03/06/2012 0453   ALBUMIN 3.7 03/06/2012 0453   AST 33 03/06/2012 0453   ALT 23 03/06/2012 0453   ALKPHOS 65 03/06/2012 0453   BILITOT 0.4 03/06/2012 0453   GFRNONAA >60 01/07/2013 0452     No results found.     Assessment & Plan:   1. Leg swelling (Primary) After the patient's recent hospitalization the swelling is much improved after his IV diuresis.  I suspect that his swelling is more so multifactorial than just an underlying vascular cause.  Today his swelling is much better with no open wounds or ulcerations.  Will have her return in 6 weeks to evaluate progress with use of compression.  2. Primary hypertension Continue antihypertensive medications as already ordered, these medications have been reviewed and there are no changes at this time.  3. Type II diabetes mellitus with manifestations (HCC) Continue hypoglycemic medications as already ordered, these medications have been reviewed and there are no changes at this time.  Hgb A1C to be monitored as already arranged by primary service   4. Open wound of left elbow, initial encounter The patient does have a wound with some maintenance after pulling self up in the hospital but appears that it started as a skin tear.  Will send in doxycycline  for the patient however he is advised that if it worsens he should follow-up with PCP for further treatment and evaluation.  Current Outpatient Medications on File Prior to Visit  Medication Sig Dispense Refill   Acoramidis, 712MG  Twice Daily, (ATTRUBY) 356 MG TBPK Take 712 mg by mouth 2 (two) times daily.     Allopurinol 200 MG TABS      amiodarone (PACERONE) 200 MG tablet Take 200 mg by mouth daily.     apixaban (ELIQUIS) 5 MG TABS tablet Take 5 mg by mouth 2 (two) times daily.     Ascorbic Acid (VITAMIN C) 1000 MG tablet Take 1,000 mg by mouth daily.     atorvastatin (LIPITOR) 40 MG tablet Take 40 mg by  mouth daily.     empagliflozin (JARDIANCE) 10 MG TABS tablet Take 1 tablet by mouth daily.     gabapentin (NEURONTIN) 100 MG tablet Take 300 mg by mouth 3 (three) times daily.     hydrocortisone (CORTEF) 5 MG tablet Take 5 mg by mouth 2 (two) times daily. Take 10 mg (2 tablets) in the by  mouth in the morning and 5 mg (1 tablet) at night daily as needed for symptoms of adrenal insufficiency.     Melatonin 10 MG TABS Take 10 mg by mouth Nightly.     metFORMIN (GLUCOPHAGE) 1000 MG tablet Take 500 mg by mouth 2 (two) times daily with a meal. 1/2 tablet in the morning and bedtime     methocarbamol (ROBAXIN) 750 MG tablet Take 750 mg by mouth 2 (two) times daily.     Multiple Vitamin (MULTI-VITAMINS) TABS Take by mouth.     omeprazole (PRILOSEC) 40 MG capsule TAKE 1 CAPSULE TWICE DAILY     ONETOUCH VERIO test strip daily.     potassium chloride (MICRO-K) 10 MEQ CR capsule Take 20 mEq by mouth daily.     sildenafil  (REVATIO ) 20 MG tablet 1 tablet daily (Patient taking differently: 20 mg as needed. 1 tablet daily) 90 tablet 3   simvastatin (ZOCOR) 40 MG tablet TAKE 1 TABLET EVERY NIGHT     simvastatin (ZOCOR) 40 MG tablet Take 1 tablet by mouth at bedtime.     tamsulosin (FLOMAX) 0.4 MG CAPS capsule TAKE 1 CAPSULE TWICE DAILY     torsemide (DEMADEX) 20 MG tablet Take 20 mg by mouth as needed.     traMADol (ULTRAM) 50 MG tablet Take by mouth every 6 (six) hours as needed.     TRESIBA FLEXTOUCH 100 UNIT/ML FlexTouch Pen Inject 25 Units into the skin at bedtime.     aspirin EC 81 MG tablet Take by mouth. (Patient not taking: Reported on 05/22/2024)     glimepiride (AMARYL) 2 MG tablet Take 2 mg by mouth daily with breakfast. (Patient not taking: Reported on 05/08/2024)     hydrochlorothiazide (HYDRODIURIL) 25 MG tablet Take by mouth. (Patient not taking: Reported on 05/22/2024)     HYDROcodone-acetaminophen (NORCO/VICODIN) 5-325 MG tablet 1 po q6h prn (Patient not taking: Reported on 02/12/2023)     meloxicam  (MOBIC) 7.5 MG tablet Take 7.5 mg by mouth daily. (Patient not taking: Reported on 05/22/2024)     metoprolol succinate (TOPROL-XL) 25 MG 24 hr tablet Take 25 mg by mouth daily. (Patient not taking: Reported on 05/22/2024)     pioglitazone (ACTOS) 30 MG tablet Take 30 mg by mouth daily. (Patient not taking: Reported on 05/22/2024)     spironolactone (ALDACTONE) 25 MG tablet Take 12.5 mg by mouth. (Patient not taking: Reported on 05/22/2024)     warfarin (COUMADIN) 3 MG tablet Take 3 mg by mouth. Five days per week (Patient not taking: Reported on 05/22/2024)     warfarin (COUMADIN) 5 MG tablet Take by mouth. 2 days per week (Patient not taking: Reported on 05/22/2024)     No current facility-administered medications on file prior to visit.    There are no Patient Instructions on file for this visit. No follow-ups on file.   Lanyla Costello E Rayven Hendrickson, NP

## 2024-06-03 ENCOUNTER — Telehealth: Payer: Self-pay | Admitting: Urology

## 2024-06-03 NOTE — Telephone Encounter (Signed)
 Pt has an appt with Dr. Cherylene Corrente on 6/20 but is on house arrest under [facility] and wants to know if he can do the visit via video visit instead?  Please advise.

## 2024-06-05 ENCOUNTER — Ambulatory Visit: Admitting: Urology

## 2024-06-29 ENCOUNTER — Encounter: Attending: Physician Assistant | Admitting: Physician Assistant

## 2024-06-29 DIAGNOSIS — Z7901 Long term (current) use of anticoagulants: Secondary | ICD-10-CM | POA: Diagnosis not present

## 2024-06-29 DIAGNOSIS — I2510 Atherosclerotic heart disease of native coronary artery without angina pectoris: Secondary | ICD-10-CM | POA: Diagnosis not present

## 2024-06-29 DIAGNOSIS — L03114 Cellulitis of left upper limb: Secondary | ICD-10-CM | POA: Insufficient documentation

## 2024-06-29 DIAGNOSIS — I714 Abdominal aortic aneurysm, without rupture, unspecified: Secondary | ICD-10-CM | POA: Diagnosis not present

## 2024-06-29 DIAGNOSIS — L98492 Non-pressure chronic ulcer of skin of other sites with fat layer exposed: Secondary | ICD-10-CM | POA: Insufficient documentation

## 2024-06-29 DIAGNOSIS — T8131XA Disruption of external operation (surgical) wound, not elsewhere classified, initial encounter: Secondary | ICD-10-CM | POA: Insufficient documentation

## 2024-06-29 DIAGNOSIS — X58XXXA Exposure to other specified factors, initial encounter: Secondary | ICD-10-CM | POA: Diagnosis not present

## 2024-07-03 ENCOUNTER — Encounter (INDEPENDENT_AMBULATORY_CARE_PROVIDER_SITE_OTHER): Payer: Self-pay | Admitting: Nurse Practitioner

## 2024-07-03 ENCOUNTER — Ambulatory Visit (INDEPENDENT_AMBULATORY_CARE_PROVIDER_SITE_OTHER): Admitting: Nurse Practitioner

## 2024-07-03 VITALS — BP 100/63 | HR 98 | Resp 18 | Wt 243.0 lb

## 2024-07-03 DIAGNOSIS — E118 Type 2 diabetes mellitus with unspecified complications: Secondary | ICD-10-CM

## 2024-07-03 DIAGNOSIS — I10 Essential (primary) hypertension: Secondary | ICD-10-CM

## 2024-07-03 DIAGNOSIS — M7989 Other specified soft tissue disorders: Secondary | ICD-10-CM

## 2024-07-06 ENCOUNTER — Encounter (INDEPENDENT_AMBULATORY_CARE_PROVIDER_SITE_OTHER): Payer: Self-pay | Admitting: Nurse Practitioner

## 2024-07-06 ENCOUNTER — Encounter: Admitting: Physician Assistant

## 2024-07-06 DIAGNOSIS — L03114 Cellulitis of left upper limb: Secondary | ICD-10-CM | POA: Diagnosis not present

## 2024-07-06 NOTE — Progress Notes (Signed)
 Subjective:    Patient ID: Jimmy Greer, male    DOB: 1941-04-23, 83 y.o.   MRN: [id number] Chief Complaint  Patient presents with   Follow-up    6 week follow up    The patient returns today for follow-up evaluation of his bilateral lower extremities.  He initially had weeping and multiple wounds and ulcerations.  That has completely healed.  That was most helped by a hospitalization where he was diuresed and lost about 19 pounds.  Since then he has been doing fairly well with his weight and diuretics.  He notes that he had recently been changed over to torsemide and his cardiologist has a goal for him to lose another 10 pounds.  He is engaging in conservative therapy in addition to elevating his lower extremities.    Review of Systems  Cardiovascular:  Positive for leg swelling.  Skin:  Negative for wound.  All other systems reviewed and are negative.      Objective:   Physical Exam Vitals reviewed.  HENT:     Head: Normocephalic.  Cardiovascular:     Rate and Rhythm: Normal rate.     Pulses: Normal pulses.  Pulmonary:     Effort: Pulmonary effort is normal.  Skin:    General: Skin is warm and dry.  Neurological:     Mental Status: He is alert and oriented to person, place, and time.  Psychiatric:        Behavior: Behavior normal.        Thought Content: Thought content normal.        Judgment: Judgment normal.     BP 100/63   Pulse 98   Resp 18   Wt 243 lb (110.2 kg)   BMI 35.88 kg/m   Past Medical History:  Diagnosis Date   Arthritis    Bleeding disorder (HCC)    Cancer (HCC)    Diabetes mellitus without complication (HCC)    GERD (gastroesophageal reflux disease)    Hypertension     Social History   Socioeconomic History   Marital status: Married    Spouse name: Not on file   Number of children: Not on file   Years of education: Not on file   Highest education level: Not on file  Occupational History   Not on file  Tobacco Use   Smoking  status: Former   Smokeless tobacco: Never   Tobacco comments:    quit 35 years  Vaping Use   Vaping status: Never Used  Substance and Sexual Activity   Alcohol use: Yes   Drug use: No   Sexual activity: Yes  Other Topics Concern   Not on file  Social History Narrative   Not on file   Social Drivers of Health   Financial Resource Strain: Low Risk  (05/13/2024)   Received from [facility]   Overall Financial Resource Strain (CARDIA)    Difficulty of Paying Living Expenses: Not hard at all  Food Insecurity: No Food Insecurity (05/13/2024)   Received from [facility]   Hunger Vital Sign    Within the past 12 months, you worried that your food would run out before you got the money to buy more.: Never true    Within the past 12 months, the food you bought just didn't last and you didn't have money to get more.: Never true  Transportation Needs: No Transportation Needs (05/13/2024)   Received from [facility]  [facility] - Transportation    In the past 12 months, has lack of transportation kept you from medical appointments or from getting medications?: No    Lack of Transportation (Non-Medical): No  Physical Activity: Not on file  Stress: Not on file  Social Connections: Not on file  Intimate Partner Violence: Not on file    Past Surgical History:  Procedure Laterality Date   APPENDECTOMY     CARPAL TUNNEL RELEASE     COLONOSCOPY WITH PROPOFOL  N/A 09/25/2017   Procedure: COLONOSCOPY WITH PROPOFOL ;  Surgeon: Toledo, Ladell POUR, MD;  Location: [facility] ENDOSCOPY;  Service: Endoscopy;  Laterality: N/A;   COLONOSCOPY WITH PROPOFOL  N/A 11/05/2022   Procedure: COLONOSCOPY WITH PROPOFOL ;  Surgeon: Onita Elspeth Sharper, DO;  Location[facility] ENDOSCOPY;  Service: Gastroenterology;  Laterality: N/A;  PM CASE DUE TO SPOUSE HAS DEMENTIA. PM IS WHEN HE HAS SOMEONE TO HELP.SABRA   ESOPHAGOGASTRODUODENOSCOPY (EGD) WITH PROPOFOL  N/A 09/25/2017    Procedure: ESOPHAGOGASTRODUODENOSCOPY (EGD) WITH PROPOFOL ;  Surgeon: Toledo, Ladell POUR, MD;  Location: [facility] ENDOSCOPY;  Service: Endoscopy;  Laterality: N/A;   ESOPHAGOGASTRODUODENOSCOPY (EGD) WITH PROPOFOL  N/A 11/05/2022   Procedure: ESOPHAGOGASTRODUODENOSCOPY (EGD) WITH PROPOFOL ;  Surgeon: Onita Elspeth Sharper, DO;  Location: [facility] ENDOSCOPY;  Service: Gastroenterology;  Laterality: N/A;   GALLBLADDER SURGERY     REPLACEMENT TOTAL KNEE Bilateral     Family History  Problem Relation Age of Onset   Prostate cancer Brother    Bladder Cancer Neg Hx    Kidney cancer Neg Hx     No Known Allergies     Latest Ref Rng & Units 06/11/2023    3:24 PM 01/22/2023    2:29 PM 01/07/2013    4:52 AM  CBC  WBC 4.0 - 10.5 K/uL 4.3  3.4    Hemoglobin 13.0 - 17.0 g/dL 88.0  86.7  9.0   Hematocrit 39.0 - 52.0 % 35.4  38.2    Platelets 150 - 400 K/uL 158  147  148       CMP     Component Value Date/Time   NA 137 01/07/2013 0452   K 3.8 01/07/2013 0452   CL 105 01/07/2013 0452   CO2 25 01/07/2013 0452   GLUCOSE 117 (H) 01/07/2013 0452   BUN 11 01/07/2013 0452   CREATININE 1.30 (H) 10/03/2022 1323   CREATININE 0.90 01/07/2013 0452   CALCIUM 7.9 (L) 01/07/2013 0452   PROT 7.0 03/06/2012 0453   ALBUMIN 3.7 03/06/2012 0453   AST 33 03/06/2012 0453   ALT 23 03/06/2012 0453   ALKPHOS 65 03/06/2012 0453   BILITOT 0.4 03/06/2012 0453   GFRNONAA >60 01/07/2013 0452     No results found.     Assessment & Plan:   1. Leg swelling (Primary) After the patient's recent hospitalization the swelling is much improved after his IV diuresis.  I suspect that his swelling is more so multifactorial than just an underlying vascular cause.  Today his swelling is much better with no open wounds or ulcerations.  I have noticed that we have not assess for venous insufficiency however until he returns we will undergo venous reflux study to evaluate that as well.  Will continue with conservative therapy I will have  him return in 3 months.  2. Primary hypertension Continue antihypertensive medications as already ordered, these medications have been reviewed and there are no changes at this time.  3. Type II diabetes mellitus with manifestations (HCC) Continue hypoglycemic medications as already ordered, these medications have been  reviewed and there are no changes at this time.  Hgb A1C to be monitored as already arranged by primary service   4. Open wound of left elbow, initial encounter The patient does have a wound with some maintenance after pulling self up in the hospital but appears that it started as a skin tear.  Will send in doxycycline  for the patient however he is advised that if it worsens he should follow-up with PCP for further treatment and evaluation.  Current Outpatient Medications on File Prior to Visit  Medication Sig Dispense Refill   Acoramidis, 712MG  Twice Daily, (ATTRUBY) 356 MG TBPK Take 712 mg by mouth 2 (two) times daily.     Allopurinol 200 MG TABS      amiodarone (PACERONE) 200 MG tablet Take 200 mg by mouth daily.     apixaban (ELIQUIS) 5 MG TABS tablet Take 5 mg by mouth 2 (two) times daily.     Ascorbic Acid (VITAMIN C) 1000 MG tablet Take 1,000 mg by mouth daily.     atorvastatin (LIPITOR) 40 MG tablet Take 40 mg by mouth daily.     doxycycline  (VIBRAMYCIN ) 100 MG capsule Take 1 capsule (100 mg total) by mouth 2 (two) times daily. 20 capsule 0   empagliflozin (JARDIANCE) 10 MG TABS tablet Take 1 tablet by mouth daily.     gabapentin (NEURONTIN) 100 MG tablet Take 300 mg by mouth 3 (three) times daily.     hydrochlorothiazide (HYDRODIURIL) 25 MG tablet Take by mouth.     hydrocortisone (CORTEF) 5 MG tablet Take 5 mg by mouth 2 (two) times daily. Take 10 mg (2 tablets) in the by mouth in the morning and 5 mg (1 tablet) at night daily as needed for symptoms of adrenal insufficiency.     Melatonin 10 MG TABS Take 10 mg by mouth Nightly.     metFORMIN (GLUCOPHAGE) 1000 MG  tablet Take 500 mg by mouth 2 (two) times daily with a meal. 1/2 tablet in the morning and bedtime     methocarbamol (ROBAXIN) 750 MG tablet Take 750 mg by mouth 2 (two) times daily.     Multiple Vitamin (MULTI-VITAMINS) TABS Take by mouth.     omeprazole (PRILOSEC) 40 MG capsule TAKE 1 CAPSULE TWICE DAILY     ONETOUCH VERIO test strip daily.     potassium chloride (MICRO-K) 10 MEQ CR capsule Take 20 mEq by mouth daily.     sildenafil  (REVATIO ) 20 MG tablet 1 tablet daily (Patient taking differently: 20 mg as needed. 1 tablet daily) 90 tablet 3   simvastatin (ZOCOR) 40 MG tablet TAKE 1 TABLET EVERY NIGHT     simvastatin (ZOCOR) 40 MG tablet Take 1 tablet by mouth at bedtime.     spironolactone (ALDACTONE) 25 MG tablet Take 12.5 mg by mouth.     tamsulosin (FLOMAX) 0.4 MG CAPS capsule TAKE 1 CAPSULE TWICE DAILY     torsemide (DEMADEX) 20 MG tablet Take 20 mg by mouth as needed. (Patient taking differently: Take 20 mg by mouth every other day. Mon, Wed, Friday)     traMADol (ULTRAM) 50 MG tablet Take by mouth every 6 (six) hours as needed.     TRESIBA FLEXTOUCH 100 UNIT/ML FlexTouch Pen Inject 25 Units into the skin at bedtime. (Patient taking differently: Inject 6 Units into the skin at bedtime.)     aspirin EC 81 MG tablet Take by mouth. (Patient not taking: Reported on 07/03/2024)     glimepiride (AMARYL) 2 MG  tablet Take 2 mg by mouth daily with breakfast. (Patient not taking: Reported on 07/03/2024)     HYDROcodone-acetaminophen (NORCO/VICODIN) 5-325 MG tablet 1 po q6h prn (Patient not taking: Reported on 07/03/2024)     meloxicam (MOBIC) 7.5 MG tablet Take 7.5 mg by mouth daily. (Patient not taking: Reported on 07/03/2024)     metoprolol succinate (TOPROL-XL) 25 MG 24 hr tablet Take 25 mg by mouth daily. (Patient not taking: Reported on 07/03/2024)     pioglitazone (ACTOS) 30 MG tablet Take 30 mg by mouth daily. (Patient not taking: Reported on 07/03/2024)     warfarin (COUMADIN) 3 MG tablet Take 3  mg by mouth. Five days per week (Patient not taking: Reported on 07/03/2024)     warfarin (COUMADIN) 5 MG tablet Take by mouth. 2 days per week (Patient not taking: Reported on 07/03/2024)     No current facility-administered medications on file prior to visit.    There are no Patient Instructions on file for this visit. No follow-ups on file.   Thaison Kolodziejski E Colum Colt, NP

## 2024-07-09 ENCOUNTER — Encounter: Payer: Self-pay | Admitting: Urology

## 2024-07-09 ENCOUNTER — Ambulatory Visit: Admitting: Urology

## 2024-07-09 VITALS — BP 127/74 | HR 79 | Ht 69.0 in | Wt 230.0 lb

## 2024-07-09 DIAGNOSIS — C61 Malignant neoplasm of prostate: Secondary | ICD-10-CM

## 2024-07-09 DIAGNOSIS — R350 Frequency of micturition: Secondary | ICD-10-CM

## 2024-07-09 DIAGNOSIS — N3941 Urge incontinence: Secondary | ICD-10-CM

## 2024-07-09 DIAGNOSIS — R3129 Other microscopic hematuria: Secondary | ICD-10-CM

## 2024-07-09 LAB — URINALYSIS, COMPLETE
Bilirubin, UA: NEGATIVE
Glucose, UA: NEGATIVE
Ketones, UA: NEGATIVE
Leukocytes,UA: NEGATIVE
Nitrite, UA: NEGATIVE
Protein,UA: NEGATIVE
Specific Gravity, UA: 1.02 (ref 1.005–1.030)
Urobilinogen, Ur: 0.2 mg/dL (ref 0.2–1.0)
pH, UA: 6 (ref 5.0–7.5)

## 2024-07-09 LAB — MICROSCOPIC EXAMINATION

## 2024-07-09 MED ORDER — GEMTESA 75 MG PO TABS: 1.0000 | ORAL_TABLET | Freq: Every day | ORAL | Status: DC

## 2024-07-09 NOTE — Progress Notes (Signed)
 07/09/2024 1:46 PM   Jimmy Greer 06/21/1941 [id number]  Referring provider: Auston Reyes BIRCH, MD [address] [facility] [place],  [place] [address]  Chief Complaint  Patient presents with   Follow-up    Urologic history: 1. T2 high risk prostate cancer -Diagnosed 07/2018; PSA 8.6; 48 cc gland -IMRT plus ADT as primary treatment -IMRT completed December 2019  2.  Lower urinary tract symptoms -Post treatment storage related voiding symptoms refractory to medical management -Botox  06/05/2020 and 12/05/2020 by Dr. Gaston with significant improvement  HPI: 83y.o. male presents for follow-up.  I last saw him July 2022 He was last seen here August 2022 and underwent Botox  by Dr. Gaston Recently discharged from a 48-month hospitalization at [facility] for cellulitis PSA ordered by PCP May 2025 was stable at 0.07 Remains on tamsulosin and still complaining of frequency, urgency with urge incontinence.  Does have intermittent scrotal redness for which he uses [employer]bond He thinks this has improved since discontinuing Jardiance   PMH: Past Medical History:  Diagnosis Date   Arthritis    Bleeding disorder (HCC)    Cancer (HCC)    Diabetes mellitus without complication (HCC)    GERD (gastroesophageal reflux disease)    Hypertension     Surgical History: Past Surgical History:  Procedure Laterality Date   APPENDECTOMY     CARPAL TUNNEL RELEASE     COLONOSCOPY WITH PROPOFOL  N/A 09/25/2017   Procedure: COLONOSCOPY WITH PROPOFOL ;  Surgeon: Toledo, Ladell POUR, MD;  Location: [facility] ENDOSCOPY;  Service: Endoscopy;  Laterality: N/A;   COLONOSCOPY WITH PROPOFOL  N/A 11/05/2022   Procedure: COLONOSCOPY WITH PROPOFOL ;  Surgeon: Onita Elspeth Sharper, DO;  Location: [facility] ENDOSCOPY;  Service: Gastroenterology;  Laterality: N/A;  PM CASE DUE TO SPOUSE HAS DEMENTIA. PM IS WHEN HE HAS SOMEONE TO HELP.SABRA   ESOPHAGOGASTRODUODENOSCOPY (EGD) WITH PROPOFOL  N/A 09/25/2017    Procedure: ESOPHAGOGASTRODUODENOSCOPY (EGD) WITH PROPOFOL ;  Surgeon: Toledo, Ladell POUR, MD;  Location: [facility] ENDOSCOPY;  Service: Endoscopy;  Laterality: N/A;   ESOPHAGOGASTRODUODENOSCOPY (EGD) WITH PROPOFOL  N/A 11/05/2022   Procedure: ESOPHAGOGASTRODUODENOSCOPY (EGD) WITH PROPOFOL ;  Surgeon: Onita Elspeth Sharper, DO;  Location: [facility] ENDOSCOPY;  Service: Gastroenterology;  Laterality: N/A;   GALLBLADDER SURGERY     REPLACEMENT TOTAL KNEE Bilateral     Home Medications:  Allergies as of 07/09/2024   No Known Allergies      Medication List        Accurate as of July 09, 2024  1:46 PM. If you have any questions, ask your nurse or doctor.          Allopurinol 200 MG Tabs   amiodarone 200 MG tablet Commonly known as: PACERONE Take 200 mg by mouth daily.   apixaban 5 MG Tabs tablet Commonly known as: ELIQUIS Take 5 mg by mouth 2 (two) times daily.   aspirin EC 81 MG tablet Take by mouth.   atorvastatin 40 MG tablet Commonly known as: LIPITOR Take 40 mg by mouth daily.   Attruby 356 MG Tbpk Generic drug: Acoramidis (712MG  Twice Daily) Take 712 mg by mouth 2 (two) times daily.   doxycycline  100 MG capsule Commonly known as: VIBRAMYCIN  Take 1 capsule (100 mg total) by mouth 2 (two) times daily.   empagliflozin 10 MG Tabs tablet Commonly known as: JARDIANCE Take 1 tablet by mouth daily.   gabapentin 100 MG tablet Commonly known as: NEURONTIN Take 300 mg by mouth 3 (three) times daily.   Gemtesa  75 MG Tabs Generic drug:  Vibegron  Take 1 tablet (75 mg total) by mouth daily. Started by: Glendia JAYSON Barba   glimepiride 2 MG tablet Commonly known as: AMARYL Take 2 mg by mouth daily with breakfast.   hydrochlorothiazide 25 MG tablet Commonly known as: HYDRODIURIL Take by mouth.   HYDROcodone-acetaminophen 5-325 MG tablet Commonly known as: NORCO/VICODIN 1 po q6h prn   hydrocortisone 5 MG tablet Commonly known as: CORTEF Take 5 mg by mouth 2 (two) times daily.  Take 10 mg (2 tablets) in the by mouth in the morning and 5 mg (1 tablet) at night daily as needed for symptoms of adrenal insufficiency.   Melatonin 10 MG Tabs Take 10 mg by mouth Nightly.   meloxicam 7.5 MG tablet Commonly known as: MOBIC Take 7.5 mg by mouth daily.   metFORMIN 1000 MG tablet Commonly known as: GLUCOPHAGE Take 500 mg by mouth 2 (two) times daily with a meal. 1/2 tablet in the morning and bedtime   methocarbamol 750 MG tablet Commonly known as: ROBAXIN Take 750 mg by mouth 2 (two) times daily.   metoprolol succinate 25 MG 24 hr tablet Commonly known as: TOPROL-XL Take 25 mg by mouth daily.   Multi-Vitamins Tabs Take by mouth.   omeprazole 40 MG capsule Commonly known as: PRILOSEC TAKE 1 CAPSULE TWICE DAILY   OneTouch Verio test strip Generic drug: glucose blood daily.   pioglitazone 30 MG tablet Commonly known as: ACTOS Take 30 mg by mouth daily.   potassium chloride 10 MEQ CR capsule Commonly known as: MICRO-K Take 20 mEq by mouth daily.   sildenafil  20 MG tablet Commonly known as: REVATIO  1 tablet daily What changed:  how much to take when to take this reasons to take this   simvastatin 40 MG tablet Commonly known as: ZOCOR TAKE 1 TABLET EVERY NIGHT   simvastatin 40 MG tablet Commonly known as: ZOCOR Take 1 tablet by mouth at bedtime.   spironolactone 25 MG tablet Commonly known as: ALDACTONE Take 12.5 mg by mouth.   tamsulosin 0.4 MG Caps capsule Commonly known as: FLOMAX TAKE 1 CAPSULE TWICE DAILY   torsemide 20 MG tablet Commonly known as: DEMADEX Take 20 mg by mouth as needed. What changed:  when to take this additional instructions   traMADol 50 MG tablet Commonly known as: ULTRAM Take by mouth every 6 (six) hours as needed.   Tresiba FlexTouch 100 UNIT/ML FlexTouch Pen Generic drug: insulin degludec Inject 25 Units into the skin at bedtime. What changed: how much to take   vitamin C 1000 MG tablet Take 1,000 mg  by mouth daily.   warfarin 5 MG tablet Commonly known as: COUMADIN Take by mouth. 2 days per week   warfarin 3 MG tablet Commonly known as: COUMADIN Take 3 mg by mouth. Five days per week        Allergies: No Known Allergies  Family History: Family History  Problem Relation Age of Onset   Prostate cancer Brother    Bladder Cancer Neg Hx    Kidney cancer Neg Hx     Social History:  reports that he has quit smoking. He has never used smokeless tobacco. He reports current alcohol use. He reports that he does not use drugs.   Physical Exam: BP 127/74   Pulse 79   Ht 5' 9 (1.753 m)   Wt 230 lb (104.3 kg)   BMI 33.97 kg/m   Constitutional:  Alert and oriented, No acute distress. HEENT: [place] AT, moist mucus membranes.  Trachea midline, no masses. Cardiovascular: No clubbing, cyanosis, or edema. Respiratory: Normal respiratory effort, no increased work of breathing. GU: Minimal scrotal erythema.  No significant edema  Laboratory:  Urinalysis: Microscopy 3-10 RBC   Assessment & Plan:    1.  T2 high risk prostate cancer PSA remains low and stable  2.  Overactive bladder PVR today Trial Gemtesa  75 mg daily-samples given Past records indicate Botox  had been effective and if no improvement on Gemtesa  will have him follow-up with Dr. MacDiarmid for consideration of repeat Botox   3.  Microhematuria Schedule CT urogram Will await CT results prior to cystoscopy   Glendia JAYSON Barba, MD  [facility] [address] [place], [place] [address] [phone]

## 2024-07-10 ENCOUNTER — Ambulatory Visit: Payer: Self-pay | Admitting: Urology

## 2024-07-10 DIAGNOSIS — R310 Gross hematuria: Secondary | ICD-10-CM

## 2024-07-14 ENCOUNTER — Ambulatory Visit: Admitting: Physician Assistant

## 2024-07-17 ENCOUNTER — Ambulatory Visit
Admission: RE | Admit: 2024-07-17 | Discharge: 2024-07-17 | Disposition: A | Discharge status: Home / Self Care | Source: Ambulatory Visit | Attending: Urology | Admitting: Urology

## 2024-07-17 DIAGNOSIS — R310 Gross hematuria: Secondary | ICD-10-CM | POA: Diagnosis present

## 2024-07-17 MED ORDER — IOHEXOL 300 MG/ML  SOLN
100.0000 mL | Freq: Once | INTRAMUSCULAR | Status: AC | PRN
  Administered 2024-07-17: 100 mL via INTRAVENOUS

## 2024-07-18 ENCOUNTER — Ambulatory Visit: Payer: Self-pay | Admitting: Urology

## 2024-08-05 ENCOUNTER — Other Ambulatory Visit: Payer: Self-pay | Admitting: *Deleted

## 2024-08-05 ENCOUNTER — Telehealth: Payer: Self-pay | Admitting: Urology

## 2024-08-05 MED ORDER — GEMTESA 75 MG PO TABS: 1.0000 | ORAL_TABLET | Freq: Every day | ORAL | 3 refills | Status: DC

## 2024-08-05 NOTE — Telephone Encounter (Signed)
 Sent medication in to [facility] .

## 2024-08-05 NOTE — Telephone Encounter (Signed)
 Patient called and left message stating that the samples for Gemtesa  that were given to him worked very well. Patient would like to know if a script for a 90 day supply can be faxed to his pharmacy. Please advise.

## 2024-08-07 ENCOUNTER — Other Ambulatory Visit: Payer: Self-pay

## 2024-08-07 DIAGNOSIS — R350 Frequency of micturition: Secondary | ICD-10-CM

## 2024-08-07 DIAGNOSIS — N3941 Urge incontinence: Secondary | ICD-10-CM

## 2024-08-07 MED ORDER — MIRABEGRON ER 50 MG PO TB24: 50.0000 mg | ORAL_TABLET | Freq: Every day | ORAL | 1 refills | Status: AC

## 2024-08-07 NOTE — Telephone Encounter (Signed)
 Pt called in wanting to know if his Gemtesa  had been approved yet. I explained that the pharmacy has to send in the PA for us  to then get back and sign off on. That its a lengthy process. Pt voiced frustrations with this not being explained to him orginally. I instructed him to call his pharmacy so he can see if his insurance with cover the med. I also let him know that there is a savings card he could contact Gemtesa  about in case he isn't approved by insurance.

## 2024-08-11 ENCOUNTER — Ambulatory Visit: Admitting: Urology

## 2024-08-11 VITALS — BP 126/73 | HR 70

## 2024-08-11 DIAGNOSIS — R350 Frequency of micturition: Secondary | ICD-10-CM

## 2024-08-11 DIAGNOSIS — C61 Malignant neoplasm of prostate: Secondary | ICD-10-CM

## 2024-08-11 DIAGNOSIS — R3129 Other microscopic hematuria: Secondary | ICD-10-CM

## 2024-08-11 DIAGNOSIS — N3941 Urge incontinence: Secondary | ICD-10-CM

## 2024-08-11 LAB — URINALYSIS, COMPLETE
Bilirubin, UA: NEGATIVE
Glucose, UA: NEGATIVE
Leukocytes,UA: NEGATIVE
Nitrite, UA: NEGATIVE
RBC, UA: NEGATIVE
Specific Gravity, UA: 1.02 (ref 1.005–1.030)
Urobilinogen, Ur: 1 mg/dL (ref 0.2–1.0)
pH, UA: 6 (ref 5.0–7.5)

## 2024-08-11 LAB — MICROSCOPIC EXAMINATION

## 2024-08-11 MED ORDER — LIDOCAINE HCL URETHRAL/MUCOSAL 2 % EX GEL
1.0000 | Freq: Once | CUTANEOUS | Status: AC
  Administered 2024-08-11: 1 via URETHRAL

## 2024-08-15 ENCOUNTER — Encounter: Payer: Self-pay | Admitting: Urology

## 2024-08-15 NOTE — Progress Notes (Signed)
   08/15/24  CC:  Chief Complaint  Patient presents with   Cysto    HPI: Refer to my prior note 07/06/2023.  Did note significant improvement in his voiding symptoms on Gemtesa  and is awaiting insurance authorization.  CT urogram 07/17/2024 showed no upper tract abnormalities.  UA today with negative microscopy  Blood pressure 126/73, pulse 70, SpO2 98%. NED. A&Ox3.   No respiratory distress   Abd soft, NT, ND Normal phallus with bilateral descended testicles  Cystoscopy Procedure Note  Patient identification was confirmed, informed consent was obtained, and patient was prepped using Betadine solution.  Lidocaine  jelly was administered per urethral meatus.     Pre-Procedure: - Inspection reveals a normal caliber urethral meatus.  Procedure: The flexible cystoscope was introduced without difficulty - No urethral strictures/lesions are present. - Mild lateral lobe enlargement prostate with hypervascularity/friability - Normal bladder neck - Bilateral ureteral orifices identified - Bladder mucosa  reveals no ulcers, tumors, or lesions - No bladder stones - Mild trabeculation  Retroflexion shows no tumor or intravesical median lobe   Post-Procedure: - Patient tolerated the procedure well  Assessment/ Plan: No upper tract abnormalities on CT urogram Cystoscopy with hypervascularity prostatic urethra; no bladder mucosal lesions 1 year follow-up with UA    Glendia JAYSON Barba, MD

## 2024-09-29 ENCOUNTER — Encounter (INDEPENDENT_AMBULATORY_CARE_PROVIDER_SITE_OTHER): Payer: Self-pay | Admitting: Vascular Surgery

## 2024-09-29 ENCOUNTER — Ambulatory Visit (INDEPENDENT_AMBULATORY_CARE_PROVIDER_SITE_OTHER): Admitting: Vascular Surgery

## 2024-09-29 VITALS — BP 113/70 | HR 70 | Resp 18 | Wt 236.8 lb

## 2024-09-29 DIAGNOSIS — M7989 Other specified soft tissue disorders: Secondary | ICD-10-CM

## 2024-09-29 DIAGNOSIS — I7121 Aneurysm of the ascending aorta, without rupture: Secondary | ICD-10-CM

## 2024-09-29 DIAGNOSIS — I10 Essential (primary) hypertension: Secondary | ICD-10-CM | POA: Diagnosis not present

## 2024-09-29 DIAGNOSIS — E118 Type 2 diabetes mellitus with unspecified complications: Secondary | ICD-10-CM

## 2024-09-29 DIAGNOSIS — I2699 Other pulmonary embolism without acute cor pulmonale: Secondary | ICD-10-CM

## 2024-09-29 NOTE — Assessment & Plan Note (Signed)
 Swelling is under much better control with diuresis and control of his medical symptoms.  His activity level has significantly improved as well which is likely helped.  Continue compression, elevation, and exercise.

## 2024-09-29 NOTE — Assessment & Plan Note (Signed)
 Will order CT of the chest for the coming weeks.

## 2024-09-29 NOTE — Progress Notes (Signed)
 MRN : [id number]  Jimmy Greer is a 83 y.o. ([birth year]) male who presents with chief complaint of  Chief Complaint  Patient presents with   Follow-up    3 month follow up  .  History of Present Illness: Patient returns today in follow up of his lower extremity swelling.  This has gotten better.  It is fairly mild at this point.  He is wearing his compression socks on a daily basis.  He is down to what is considered his dry weight of roughly 235 pounds. He also has an aneurysm of the ascending aorta.  No imaging has been done recently and this was last seen almost a year ago.  He is due for his chest CT in the coming weeks.  No significant aneurysm related symptoms.  No chest pain, back pain, or signs of peripheral embolization.  Current Outpatient Medications  Medication Sig Dispense Refill   Acoramidis, 712MG  Twice Daily, (ATTRUBY) 356 MG TBPK Take 712 mg by mouth 2 (two) times daily.     Allopurinol 200 MG TABS      amiodarone (PACERONE) 200 MG tablet Take 200 mg by mouth daily.     apixaban (ELIQUIS) 5 MG TABS tablet Take 5 mg by mouth 2 (two) times daily.     Ascorbic Acid (VITAMIN C) 1000 MG tablet Take 1,000 mg by mouth daily.     atorvastatin (LIPITOR) 40 MG tablet Take 40 mg by mouth daily.     gabapentin (NEURONTIN) 100 MG tablet Take 300 mg by mouth 3 (three) times daily.     hydrocortisone (CORTEF) 5 MG tablet Take 5 mg by mouth 2 (two) times daily. Take 10 mg (2 tablets) in the by mouth in the morning and 5 mg (1 tablet) at night daily as needed for symptoms of adrenal insufficiency.     Melatonin 10 MG TABS Take 10 mg by mouth Nightly.     methocarbamol (ROBAXIN) 750 MG tablet Take 750 mg by mouth 2 (two) times daily.     mirabegron  ER (MYRBETRIQ ) 50 MG TB24 tablet Take 1 tablet (50 mg total) by mouth daily. 90 tablet 1   Multiple Vitamin (MULTI-VITAMINS) TABS Take by mouth.     omeprazole (PRILOSEC) 40 MG capsule TAKE 1 CAPSULE TWICE DAILY     ONETOUCH VERIO test  strip daily.     potassium chloride (MICRO-K) 10 MEQ CR capsule Take 20 mEq by mouth daily.     sildenafil  (REVATIO ) 20 MG tablet 1 tablet daily (Patient taking differently: 20 mg as needed. 1 tablet daily) 90 tablet 3   simvastatin (ZOCOR) 40 MG tablet TAKE 1 TABLET EVERY NIGHT     simvastatin (ZOCOR) 40 MG tablet Take 1 tablet by mouth at bedtime.     spironolactone (ALDACTONE) 25 MG tablet Take 12.5 mg by mouth.     tamsulosin (FLOMAX) 0.4 MG CAPS capsule TAKE 1 CAPSULE TWICE DAILY     torsemide (DEMADEX) 20 MG tablet Take 20 mg by mouth as needed. (Patient taking differently: Take 20 mg by mouth every other day. Mon, Wed, Friday)     traMADol (ULTRAM) 50 MG tablet Take by mouth every 6 (six) hours as needed.     TRESIBA FLEXTOUCH 100 UNIT/ML FlexTouch Pen Inject 25 Units into the skin at bedtime. (Patient taking differently: Inject 6 Units into the skin at bedtime.)     metFORMIN (GLUCOPHAGE) 1000 MG tablet Take 500 mg by mouth 2 (two) times daily with a meal.  1/2 tablet in the morning and bedtime (Patient not taking: Reported on 09/29/2024)     No current facility-administered medications for this visit.    Past Medical History:  Diagnosis Date   Arthritis    Bleeding disorder    Cancer (HCC)    Diabetes mellitus without complication (HCC)    GERD (gastroesophageal reflux disease)    Hypertension     Past Surgical History:  Procedure Laterality Date   APPENDECTOMY     CARPAL TUNNEL RELEASE     COLONOSCOPY WITH PROPOFOL  N/A 09/25/2017   Procedure: COLONOSCOPY WITH PROPOFOL ;  Surgeon: Toledo, Ladell POUR, MD;  Location: [facility] ENDOSCOPY;  Service: Endoscopy;  Laterality: N/A;   COLONOSCOPY WITH PROPOFOL  N/A 11/05/2022   Procedure: COLONOSCOPY WITH PROPOFOL ;  Surgeon: Onita Elspeth Sharper, DO;  Location: [facility] ENDOSCOPY;  Service: Gastroenterology;  Laterality: N/A;  PM CASE DUE TO SPOUSE HAS DEMENTIA. PM IS WHEN HE HAS SOMEONE TO HELP.SABRA   ESOPHAGOGASTRODUODENOSCOPY (EGD) WITH PROPOFOL   N/A 09/25/2017   Procedure: ESOPHAGOGASTRODUODENOSCOPY (EGD) WITH PROPOFOL ;  Surgeon: Toledo, Ladell POUR, MD;  Location: [facility] ENDOSCOPY;  Service: Endoscopy;  Laterality: N/A;   ESOPHAGOGASTRODUODENOSCOPY (EGD) WITH PROPOFOL  N/A 11/05/2022   Procedure: ESOPHAGOGASTRODUODENOSCOPY (EGD) WITH PROPOFOL ;  Surgeon: Onita Elspeth Sharper, DO;  Location: [facility] ENDOSCOPY;  Service: Gastroenterology;  Laterality: N/A;   GALLBLADDER SURGERY     REPLACEMENT TOTAL KNEE Bilateral      Social History   Tobacco Use   Smoking status: Former   Smokeless tobacco: Never   Tobacco comments:    quit 35 years  Vaping Use   Vaping status: Never Used  Substance Use Topics   Alcohol use: Yes   Drug use: No      Family History  Problem Relation Age of Onset   Prostate cancer Brother    Bladder Cancer Neg Hx    Kidney cancer Neg Hx      No Known Allergies   REVIEW OF SYSTEMS (Negative unless checked)   Constitutional: [x] Weight loss  [] Fever  [] Chills Cardiac: [] Chest pain   [] Chest pressure   [] Palpitations   [] Shortness of breath when laying flat   [x] Shortness of breath at rest   [x] Shortness of breath with exertion. Vascular:  [] Pain in legs with walking   [] Pain in legs at rest   [] Pain in legs when laying flat   [] Claudication   [] Pain in feet when walking  [] Pain in feet at rest  [] Pain in feet when laying flat   [x] History of DVT   [] Phlebitis   [x] Swelling in legs   [] Varicose veins   [] Non-healing ulcers Pulmonary:   [] Uses home oxygen   [] Productive cough   [] Hemoptysis   [] Wheeze  [] COPD   [] Asthma Neurologic:  [] Dizziness  [] Blackouts   [] Seizures   [] History of stroke   [] History of TIA  [] Aphasia   [] Temporary blindness   [] Dysphagia   [] Weakness or numbness in arms   [] Weakness or numbness in legs Musculoskeletal:  [x] Arthritis   [] Joint swelling   [] Joint pain   [] Low back pain Hematologic:  [] Easy bruising  [] Easy bleeding   [] Hypercoagulable state   [] Anemic   [] Hepatitis Gastrointestinal:  [] Blood in stool   [] Vomiting blood  [] Gastroesophageal reflux/heartburn   [] Abdominal pain Genitourinary:  [] Chronic kidney disease   [] Difficult urination  [] Frequent urination  [] Burning with urination   [] Hematuria Skin:  [] Rashes   [] Ulcers   [] Wounds Psychological:  [] History of anxiety   []  History of major depression  Physical Examination  BP 113/70   Pulse 70   Resp 18   Wt 236 lb 12.8 oz (107.4 kg)   BMI 34.97 kg/m  Gen:  WD/WN, NAD Head: [place]/AT, No temporalis wasting. Ear/Nose/Throat: Hearing grossly intact, nares w/o erythema or drainage Eyes: Conjunctiva clear. Sclera non-icteric Neck: Supple.  Trachea midline Pulmonary:  Good air movement, no use of accessory muscles.  Cardiac: irregular Vascular:  Vessel Right Left  Radial Palpable Palpable               Musculoskeletal: M/S 5/5 throughout.  No deformity or atrophy. 1+ RLE edema, trace LLE edema. Neurologic: Sensation grossly intact in extremities.  Symmetrical.  Speech is fluent.  Psychiatric: Judgment intact, Mood & affect appropriate for pt's clinical situation. Dermatologic: No rashes or ulcers noted.  No cellulitis or open wounds.      Labs Recent Results (from the past 2160 hours)  Urinalysis, Complete     Status: Abnormal   Collection Time: 07/09/24  1:49 PM  Result Value Ref Range   Specific Gravity, UA 1.020 1.005 - 1.030   pH, UA 6.0 5.0 - 7.5   Color, UA Yellow Yellow   Appearance Ur Clear Clear   Leukocytes,UA Negative Negative   Protein,UA Negative Negative/Trace   Glucose, UA Negative Negative   Ketones, UA Negative Negative   RBC, UA Trace (A) Negative   Bilirubin, UA Negative Negative   Urobilinogen, Ur 0.2 0.2 - 1.0 mg/dL   Nitrite, UA Negative Negative   Microscopic Examination See below:     Comment: Microscopic was indicated and was performed.  Microscopic Examination     Status: Abnormal   Collection Time: 07/09/24  1:49 PM   Urine  Result  Value Ref Range   WBC, UA 0-5 0 - 5 /hpf   RBC, Urine 3-10 (A) 0 - 2 /hpf   Epithelial Cells (non renal) 0-10 0 - 10 /hpf   Bacteria, UA Few None seen/Few  Urinalysis, Complete     Status: Abnormal   Collection Time: 08/11/24 11:20 AM  Result Value Ref Range   Specific Gravity, UA 1.020 1.005 - 1.030   pH, UA 6.0 5.0 - 7.5   Color, UA Yellow Yellow   Appearance Ur Clear Clear   Leukocytes,UA Negative Negative   Protein,UA Trace Negative/Trace   Glucose, UA Negative Negative   Ketones, UA Trace (A) Negative   RBC, UA Negative Negative   Bilirubin, UA Negative Negative   Urobilinogen, Ur 1.0 0.2 - 1.0 mg/dL   Nitrite, UA Negative Negative   Microscopic Examination Comment     Comment: Microscopic follows if indicated.   Microscopic Examination See below:     Comment: Microscopic was indicated and was performed.  Microscopic Examination     Status: None   Collection Time: 08/11/24 11:20 AM   Urine  Result Value Ref Range   WBC, UA 0-5 0 - 5 /hpf   RBC, Urine 0-2 0 - 2 /hpf   Epithelial Cells (non renal) 0-10 0 - 10 /hpf   Bacteria, UA Few None seen/Few    Radiology No results found.  Assessment/Plan  Thoracic aortic aneurysm (TAA) Will order CT of the chest for the coming weeks.  Swelling of limb Swelling is under much better control with diuresis and control of his medical symptoms.  His activity level has significantly improved as well which is likely helped.  Continue compression, elevation, and exercise.  HTN (hypertension) blood pressure control important in reducing the progression of atherosclerotic disease.  On appropriate oral medications.     Type II diabetes mellitus with manifestations (HCC) blood glucose control important in reducing the progression of atherosclerotic disease. Also, involved in wound healing. On appropriate medications.     Pulmonary embolism (HCC) On coumadin.  He would be interested in moving to one of the newer agents once they are no  longer cost prohibitive  Selinda Gu, MD  09/29/2024 2:57 PM    This note was created with Dragon medical transcription system.  Any errors from dictation are purely unintentional

## 2024-10-13 ENCOUNTER — Ambulatory Visit
Admission: RE | Admit: 2024-10-13 | Discharge: 2024-10-13 | Disposition: A | Discharge status: Home / Self Care | Source: Ambulatory Visit | Attending: Vascular Surgery | Admitting: Vascular Surgery

## 2024-10-13 DIAGNOSIS — I7121 Aneurysm of the ascending aorta, without rupture: Secondary | ICD-10-CM | POA: Diagnosis present

## 2024-10-16 ENCOUNTER — Encounter (INDEPENDENT_AMBULATORY_CARE_PROVIDER_SITE_OTHER): Payer: Self-pay | Admitting: Vascular Surgery

## 2024-10-16 ENCOUNTER — Ambulatory Visit (INDEPENDENT_AMBULATORY_CARE_PROVIDER_SITE_OTHER): Admitting: Vascular Surgery

## 2024-10-16 VITALS — BP 98/63 | HR 117 | Resp 18 | Ht 69.0 in | Wt 230.0 lb

## 2024-10-16 DIAGNOSIS — I2699 Other pulmonary embolism without acute cor pulmonale: Secondary | ICD-10-CM | POA: Diagnosis not present

## 2024-10-16 DIAGNOSIS — M7989 Other specified soft tissue disorders: Secondary | ICD-10-CM

## 2024-10-16 DIAGNOSIS — I7121 Aneurysm of the ascending aorta, without rupture: Secondary | ICD-10-CM | POA: Diagnosis not present

## 2024-10-16 DIAGNOSIS — E118 Type 2 diabetes mellitus with unspecified complications: Secondary | ICD-10-CM | POA: Diagnosis not present

## 2024-10-16 DIAGNOSIS — I10 Essential (primary) hypertension: Secondary | ICD-10-CM | POA: Diagnosis not present

## 2024-10-16 NOTE — Assessment & Plan Note (Signed)
 I have independently reviewed his CT scan of the chest.  I measured his ascending thoracic aorta at approximately 46 mm in maximal diameter.  The official report is a 47 mm.  This is minimally increased from his study 1 year ago which was approximately 44 mm.  No role for intervention at this size.  We will continue to follow this on an annual basis.

## 2024-10-16 NOTE — Progress Notes (Signed)
 MRN : [id number]  Jimmy Matsuo [facility] is a 83 y.o. ([birth year]) male who presents with chief complaint of  Chief Complaint  Patient presents with   Results    CT results  .  History of Present Illness: Patient returns today in follow up of his thoracic aortic aneurysm.  He has undergone a CT scan of the chest which I have independently reviewed.  He is not having any aneurysm related symptoms.  No chest or back pain.  No signs of peripheral embolization. I have independently reviewed his CT scan of the chest.  I measured his ascending thoracic aorta at approximately 46 mm in maximal diameter.  The official report is a 47 mm.  This is minimally increased from his study 1 year ago which was approximately 44 mm. The patient also inquires about coming off of Eliquis and going back to Coumadin.  He said he had a lot more bruising and problems with Eliquis than he ever had with Coumadin.  He did not mind monitoring the Coumadin and never had issues with it after taking it for 20 years.  He is on this for recurrent thromboembolic issues and pulmonary embolus.  Current Outpatient Medications  Medication Sig Dispense Refill   Acoramidis, 712MG  Twice Daily, (ATTRUBY) 356 MG TBPK Take 712 mg by mouth 2 (two) times daily.     Allopurinol 200 MG TABS      amiodarone (PACERONE) 200 MG tablet Take 200 mg by mouth daily.     apixaban (ELIQUIS) 5 MG TABS tablet Take 5 mg by mouth 2 (two) times daily.     Ascorbic Acid (VITAMIN C) 1000 MG tablet Take 1,000 mg by mouth daily.     atorvastatin (LIPITOR) 40 MG tablet Take 40 mg by mouth daily.     gabapentin (NEURONTIN) 100 MG tablet Take 300 mg by mouth 3 (three) times daily.     hydrocortisone (CORTEF) 5 MG tablet Take 5 mg by mouth 2 (two) times daily. Take 10 mg (2 tablets) in the by mouth in the morning and 5 mg (1 tablet) at night daily as needed for symptoms of adrenal insufficiency.     Melatonin 10 MG TABS Take 10 mg by mouth Nightly.      methocarbamol (ROBAXIN) 750 MG tablet Take 750 mg by mouth 2 (two) times daily.     mirabegron  ER (MYRBETRIQ ) 50 MG TB24 tablet Take 1 tablet (50 mg total) by mouth daily. 90 tablet 1   Multiple Vitamin (MULTI-VITAMINS) TABS Take by mouth.     omeprazole (PRILOSEC) 40 MG capsule TAKE 1 CAPSULE TWICE DAILY     ONETOUCH VERIO test strip daily.     potassium chloride (MICRO-K) 10 MEQ CR capsule Take 20 mEq by mouth daily.     sildenafil  (REVATIO ) 20 MG tablet 1 tablet daily (Patient taking differently: 20 mg as needed. 1 tablet daily) 90 tablet 3   simvastatin (ZOCOR) 40 MG tablet TAKE 1 TABLET EVERY NIGHT     simvastatin (ZOCOR) 40 MG tablet Take 1 tablet by mouth at bedtime.     spironolactone (ALDACTONE) 25 MG tablet Take 12.5 mg by mouth.     tamsulosin (FLOMAX) 0.4 MG CAPS capsule TAKE 1 CAPSULE TWICE DAILY     torsemide (DEMADEX) 20 MG tablet Take 20 mg by mouth as needed. (Patient taking differently: Take 20 mg by mouth every other day. Mon, Wed, Friday)     traMADol (ULTRAM) 50 MG tablet Take by mouth every  6 (six) hours as needed.     TRESIBA FLEXTOUCH 100 UNIT/ML FlexTouch Pen Inject 25 Units into the skin at bedtime. (Patient taking differently: Inject 6 Units into the skin at bedtime.)     metFORMIN (GLUCOPHAGE) 1000 MG tablet Take 500 mg by mouth 2 (two) times daily with a meal. 1/2 tablet in the morning and bedtime (Patient not taking: Reported on 09/29/2024)     No current facility-administered medications for this visit.    Past Medical History:  Diagnosis Date   Arthritis    Bleeding disorder    Cancer (HCC)    Diabetes mellitus without complication (HCC)    GERD (gastroesophageal reflux disease)    Hypertension     Past Surgical History:  Procedure Laterality Date   APPENDECTOMY     CARPAL TUNNEL RELEASE     COLONOSCOPY WITH PROPOFOL  N/A 09/25/2017   Procedure: COLONOSCOPY WITH PROPOFOL ;  Surgeon: Toledo, Ladell POUR, MD;  Location: [facility] ENDOSCOPY;  Service: Endoscopy;   Laterality: N/A;   COLONOSCOPY WITH PROPOFOL  N/A 11/05/2022   Procedure: COLONOSCOPY WITH PROPOFOL ;  Surgeon: Onita Elspeth Sharper, DO;  Location: [facility] ENDOSCOPY;  Service: Gastroenterology;  Laterality: N/A;  PM CASE DUE TO SPOUSE HAS DEMENTIA. PM IS WHEN HE HAS SOMEONE TO HELP.[email or web address]   ESOPHAGOGASTRODUODENOSCOPY (EGD) WITH PROPOFOL  N/A 09/25/2017   Procedure: ESOPHAGOGASTRODUODENOSCOPY (EGD) WITH PROPOFOL ;  Surgeon: Toledo, Ladell POUR, MD;  Location: [facility] ENDOSCOPY;  Service: Endoscopy;  Laterality: N/A;   ESOPHAGOGASTRODUODENOSCOPY (EGD) WITH PROPOFOL  N/A 11/05/2022   Procedure: ESOPHAGOGASTRODUODENOSCOPY (EGD) WITH PROPOFOL ;  Surgeon: Onita Elspeth Sharper, DO;  Location: [facility] ENDOSCOPY;  Service: Gastroenterology;  Laterality: N/A;   GALLBLADDER SURGERY     REPLACEMENT TOTAL KNEE Bilateral      Social History   Tobacco Use   Smoking status: Former   Smokeless tobacco: Never   Tobacco comments:    quit 35 years  Vaping Use   Vaping status: Never Used  Substance Use Topics   Alcohol use: Yes   Drug use: No      Family History  Problem Relation Age of Onset   Prostate cancer Brother    Bladder Cancer Neg Hx    Kidney cancer Neg Hx      Allergies  Allergen Reactions   Empagliflozin Dermatitis    Describes groin painful rash with testicular swelling. Resolved when stopping med     REVIEW OF SYSTEMS (Negative unless checked)   Constitutional: [x] Weight loss  [] Fever  [] Chills Cardiac: [] Chest pain   [] Chest pressure   [] Palpitations   [] Shortness of breath when laying flat   [x] Shortness of breath at rest   [x] Shortness of breath with exertion. Vascular:  [] Pain in legs with walking   [] Pain in legs at rest   [] Pain in legs when laying flat   [] Claudication   [] Pain in feet when walking  [] Pain in feet at rest  [] Pain in feet when laying flat   [x] History of DVT   [] Phlebitis   [x] Swelling in legs   [] Varicose veins   [] Non-healing ulcers Pulmonary:   [] Uses home oxygen    [] Productive cough   [] Hemoptysis   [] Wheeze  [] COPD   [] Asthma Neurologic:  [] Dizziness  [] Blackouts   [] Seizures   [] History of stroke   [] History of TIA  [] Aphasia   [] Temporary blindness   [] Dysphagia   [] Weakness or numbness in arms   [] Weakness or numbness in legs Musculoskeletal:  [x] Arthritis   [] Joint swelling   [] Joint pain   [] Low back  pain Hematologic:  [] Easy bruising  [] Easy bleeding   [] Hypercoagulable state   [] Anemic  [] Hepatitis Gastrointestinal:  [] Blood in stool   [] Vomiting blood  [] Gastroesophageal reflux/heartburn   [] Abdominal pain Genitourinary:  [] Chronic kidney disease   [] Difficult urination  [] Frequent urination  [] Burning with urination   [] Hematuria Skin:  [] Rashes   [] Ulcers   [] Wounds Psychological:  [] History of anxiety   []  History of major depression  Physical Examination  BP 98/63 (BP Location: Left Arm, Patient Position: Sitting, Cuff Size: Normal)   Pulse (!) 117   Resp 18   Ht 5' 9 (1.753 m)   Wt 230 lb (104.3 kg)   BMI 33.97 kg/m  Gen:  WD/WN, NAD Head: [place]/AT, No temporalis wasting. Ear/Nose/Throat: Hearing grossly intact, nares w/o erythema or drainage Eyes: Conjunctiva clear. Sclera non-icteric Neck: Supple.  Trachea midline Pulmonary:  Good air movement, no use of accessory muscles.  Cardiac: Tachycardic Vascular:  Vessel Right Left  Radial Palpable Palpable       Musculoskeletal: M/S 5/5 throughout.  No deformity or atrophy. 1+ RLE edema, trace LLE edema. Neurologic: Sensation grossly intact in extremities.  Symmetrical.  Speech is fluent.  Psychiatric: Judgment intact, Mood & affect appropriate for pt's clinical situation. Dermatologic: No rashes or ulcers noted.  No cellulitis or open wounds.      Labs Recent Results (from the past 2160 hours)  Urinalysis, Complete     Status: Abnormal   Collection Time: 08/11/24 11:20 AM  Result Value Ref Range   Specific Gravity, UA 1.020 1.005 - 1.030   pH, UA 6.0 5.0 - 7.5   Color, UA  Yellow Yellow   Appearance Ur Clear Clear   Leukocytes,UA Negative Negative   Protein,UA Trace Negative/Trace   Glucose, UA Negative Negative   Ketones, UA Trace (A) Negative   RBC, UA Negative Negative   Bilirubin, UA Negative Negative   Urobilinogen, Ur 1.0 0.2 - 1.0 mg/dL   Nitrite, UA Negative Negative   Microscopic Examination Comment     Comment: Microscopic follows if indicated.   Microscopic Examination See below:     Comment: Microscopic was indicated and was performed.  Microscopic Examination     Status: None   Collection Time: 08/11/24 11:20 AM   Urine  Result Value Ref Range   WBC, UA 0-5 0 - 5 /hpf   RBC, Urine 0-2 0 - 2 /hpf   Epithelial Cells (non renal) 0-10 0 - 10 /hpf   Bacteria, UA Few None seen/Few    Radiology CT CHEST WO CONTRAST Result Date: 10/14/2024 EXAM: CT CHEST WITHOUT CONTRAST 10/13/2024 01:09:34 PM TECHNIQUE: CT of the chest was performed without the administration of intravenous contrast. Multiplanar reformatted images are provided for review. Automated exposure control, iterative reconstruction, and/or weight based adjustment of the mA/kV was utilized to reduce the radiation dose to as low as reasonably achievable. COMPARISON: CT Chest without IV contrast 09/30/2023. CT chest 10/05/2021. CLINICAL HISTORY: Aortic aneurysm suspected, lower extremity swelling, intermittent posterior left chest pain, history of pulmonary embolism, prostate cancer, COVID-19. FINDINGS: MEDIASTINUM: Coronary artery calcifications. Aortic valve leaflet calcification. Stable aneurysmal ascending thoracic aorta measuring up to 4.7 cm. Descending thoracic aorta is normal in caliber. Mild atherosclerotic plaque. The central airways are clear. LYMPH NODES: No mediastinal, hilar or axillary lymphadenopathy. LUNGS AND PLEURA: No focal consolidation or pulmonary edema. No pleural effusion or pneumothorax. SOFT TISSUES/BONES: Stable chronic bilateral gynecomastia asymmetric, left greater  than right. No acute abnormality of the bones. UPPER ABDOMEN:  Limited images of the upper abdomen demonstrates no acute abnormality. IMPRESSION: 1. Stable aneurysmal ascending thoracic aorta (4.7 cm.)  Ascending thoracic aortic aneurysm.  Recommend semi-annual imaging followup by CTA or MRA and referral to cardiothoracic surgery if not already obtained.  This recommendation follows 2010 ACCF/AHA/AATS/ACR/ASA/SCA/SCAI/SIR/STS/SVM Guidelines for the Diagnosis and Management of Patients With Thoracic Aortic Disease.  Circulation. 2010; 121: [id number]. Aortic aneurysm NOS (ICD10-I71.9) Electronically signed by: Kate Plummer MD 10/14/2024 11:39 PM EDT RP Workstation: [id number]    Assessment/Plan  Thoracic aortic aneurysm (TAA) I have independently reviewed his CT scan of the chest.  I measured his ascending thoracic aorta at approximately 46 mm in maximal diameter.  The official report is a 47 mm.  This is minimally increased from his study 1 year ago which was approximately 44 mm.  No role for intervention at this size.  We will continue to follow this on an annual basis.  Swelling of limb Swelling is under much better control with diuresis and control of his medical symptoms.  His activity level has significantly improved as well which is likely helped.  Continue compression, elevation, and exercise.   HTN (hypertension) blood pressure control important in reducing the progression of atherosclerotic disease. On appropriate oral medications.     Type II diabetes mellitus with manifestations (HCC) blood glucose control important in reducing the progression of atherosclerotic disease. Also, involved in wound healing. On appropriate medications.     Pulmonary embolism (HCC) Has recently moved to Eliquis but wants to go back on coumadin.  That is certainly acceptable from my standpoint.  Selinda Gu, MD  10/16/2024 11:30 AM    This note was created with Dragon medical transcription system.   Any errors from dictation are purely unintentional

## 2024-11-16 ENCOUNTER — Other Ambulatory Visit: Payer: Self-pay | Admitting: Family Medicine

## 2024-11-16 DIAGNOSIS — M5416 Radiculopathy, lumbar region: Secondary | ICD-10-CM

## 2024-11-18 ENCOUNTER — Telehealth: Payer: Self-pay

## 2024-11-18 ENCOUNTER — Other Ambulatory Visit

## 2024-11-18 NOTE — Telephone Encounter (Signed)
 Pt LM on triage line.   Is it ok to take generic myrbetriq  (mirabegron )? Pt aware rx sent in as generic. If on issues on generic that's fine.   Pt voiced understanding.

## 2024-12-02 ENCOUNTER — Other Ambulatory Visit

## 2024-12-02 ENCOUNTER — Inpatient Hospital Stay: Admission: RE | Admit: 2024-12-02 | Discharge: 2024-12-02 | Attending: Family Medicine | Admitting: Family Medicine

## 2024-12-02 DIAGNOSIS — M5416 Radiculopathy, lumbar region: Secondary | ICD-10-CM

## 2025-08-11 ENCOUNTER — Ambulatory Visit: Admitting: Urology

## 2025-10-13 ENCOUNTER — Ambulatory Visit

## 2025-10-19 ENCOUNTER — Ambulatory Visit (INDEPENDENT_AMBULATORY_CARE_PROVIDER_SITE_OTHER): Admitting: Vascular Surgery
